# Patient Record
Sex: Female | Born: 1995 | Race: White | Hispanic: No | Smoking: Never smoker
Health system: Southern US, Community
[De-identification: ages and names within clinical notes are randomized; demographics above are authoritative.]

## PROBLEM LIST (undated history)

## (undated) ENCOUNTER — Inpatient Hospital Stay (HOSPITAL_COMMUNITY): Payer: Self-pay

## (undated) DIAGNOSIS — J45909 Unspecified asthma, uncomplicated: Secondary | ICD-10-CM

## (undated) HISTORY — PX: WISDOM TOOTH EXTRACTION: SHX21

## (undated) HISTORY — DX: Unspecified asthma, uncomplicated: J45.909

---
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
## 2016-03-08 ENCOUNTER — Encounter: Payer: Self-pay | Admitting: Family Medicine

## 2016-03-08 ENCOUNTER — Ambulatory Visit (INDEPENDENT_AMBULATORY_CARE_PROVIDER_SITE_OTHER): Payer: Medicaid Other | Admitting: Family Medicine

## 2016-03-08 VITALS — BP 116/82 | HR 94 | Temp 98.5°F | Ht 64.0 in | Wt 187.6 lb

## 2016-03-08 DIAGNOSIS — Z23 Encounter for immunization: Secondary | ICD-10-CM | POA: Diagnosis not present

## 2016-03-08 DIAGNOSIS — N926 Irregular menstruation, unspecified: Secondary | ICD-10-CM | POA: Insufficient documentation

## 2016-03-08 LAB — POCT URINE PREGNANCY: Preg Test, Ur: NEGATIVE

## 2016-03-08 NOTE — Progress Notes (Signed)
   Subjective:   Jody Mooney is a 21 y.o. female with a history of intermittent asthma here for same day appt for pregnancy test  LMP end of October or first week of November 4 home pregnancy tests - all negative Sexually active with 1 female partner Menstrual periods are typically irregular since stopping Depo Tried OCPs in 06/2015 but has been off of these for about 6 months Contraception: no Condoms: no Does not want to become pregnant for >2 yrs Denies abdominal pain, fevers, vaginal discharge, lower extremity edema  Review of Systems:  Per HPI.   Social History: never smoker  Objective:  BP 116/82 (BP Location: Right Arm, Patient Position: Sitting, Cuff Size: Normal)   Pulse 94   Temp 98.5 F (36.9 C) (Oral)   Ht 5\' 4"  (1.626 m)   Wt 187 lb 9.6 oz (85.1 kg)   LMP 12/30/2015 (Approximate)   SpO2 93%   BMI 32.20 kg/m   Gen:  21 y.o. female in NAD HEENT: NCAT, MMM, EOMI, PERRL, anicteric sclerae CV: RRR, no MRG Resp: Non-labored, CTAB, no wheezes noted Abd: Soft, NTND, BS present, no guarding or organomegaly Ext: WWP, no edema MSK: No obvious deformities, gait intact Neuro: Alert and oriented, speech normal      Urine Pregnancy negative   Assessment & Plan:     Jody Mooney is a 21 y.o. female here for   Irregular menses Urine pregnancy test negative Irregular menses likely related to recent changes in contraception management Long discussion with patient about contraceptive options Information packet given about Mirena IUD as patient is more strongly considering this method of contraception Discussed with patient that if menses remain irregular off of hormonal contraception, could consider doing lab work for PCOS Follow-up in 3-4 months if irregular menses continue for further workup or sooner if contraceptive method is desired     Virginia Crews, MD MPH PGY-3,  [facility] 03/08/2016  4:15 PM

## 2016-03-08 NOTE — Assessment & Plan Note (Signed)
Urine pregnancy test negative Irregular menses likely related to recent changes in contraception management Long discussion with patient about contraceptive options Information packet given about Mirena IUD as patient is more strongly considering this method of contraception Discussed with patient that if menses remain irregular off of hormonal contraception, could consider doing lab work for PCOS Follow-up in 3-4 months if irregular menses continue for further workup or sooner if contraceptive method is desired

## 2016-03-08 NOTE — Patient Instructions (Signed)
Nice to meet you today. Consider birth control as we discussed today. If periods remain irregular, I would make a follow-up appointment and the provider that time can consider doing lab work to see if PCOS is a possibility.  Take care, Dr. Jacinto Reap

## 2017-03-08 ENCOUNTER — Ambulatory Visit (INDEPENDENT_AMBULATORY_CARE_PROVIDER_SITE_OTHER): Payer: Medicaid Other | Admitting: Family Medicine

## 2017-03-08 ENCOUNTER — Other Ambulatory Visit: Payer: Self-pay

## 2017-03-08 ENCOUNTER — Encounter: Payer: Self-pay | Admitting: Family Medicine

## 2017-03-08 VITALS — BP 112/70 | HR 76 | Temp 98.5°F | Ht 63.0 in | Wt 177.0 lb

## 2017-03-08 DIAGNOSIS — H6123 Impacted cerumen, bilateral: Secondary | ICD-10-CM

## 2017-03-08 MED ORDER — CARBAMIDE PEROXIDE 6.5 % OT SOLN: 5.0000 [drp] | Freq: Two times a day (BID) | OTIC | 0 refills | Status: DC

## 2017-03-08 NOTE — Patient Instructions (Signed)
It was a pleasure to see you today! Thank you for choosing [facility] for your primary care. Jody Mooney was seen for hearing loss which resolved. We debrided some of the wax, but you may need further treatment with prescription drops. If it gets worse, come back to clinic in 1 month for further debridement. Stop using que tips as this compacts the wax and makes it worse.   Best,  Marny Lowenstein, MD, [place] - PGY1 03/08/2017 2:23 PM   Earwax Buildup, Adult The ears produce a substance called earwax that helps keep bacteria out of the ear and protects the skin in the ear canal. Occasionally, earwax can build up in the ear and cause discomfort or hearing loss. What increases the risk? This condition is more likely to develop in people who:  Are female.  Are elderly.  Naturally produce more earwax.  Clean their ears often with cotton swabs.  Use earplugs often.  Use in-ear headphones often.  Wear hearing aids.  Have narrow ear canals.  Have earwax that is overly thick or sticky.  Have eczema.  Are dehydrated.  Have excess hair in the ear canal.  What are the signs or symptoms? Symptoms of this condition include:  Reduced or muffled hearing.  A feeling of fullness in the ear or feeling that the ear is plugged.  Fluid coming from the ear.  Ear pain.  Ear itch.  Ringing in the ear.  Coughing.  An obvious piece of earwax that can be seen inside the ear canal.  How is this diagnosed? This condition may be diagnosed based on:  Your symptoms.  Your medical history.  An ear exam. During the exam, your health care provider will look into your ear with an instrument called an otoscope.  You may have tests, including a hearing test. How is this treated? This condition may be treated by:  Using ear drops to soften the earwax.  Having the earwax removed by a health care provider. The health care provider may: ? Flush the ear with  water. ? Use an instrument that has a loop on the end (curette). ? Use a suction device.  Surgery to remove the wax buildup. This may be done in severe cases.  Follow these instructions at home:  Take over-the-counter and prescription medicines only as told by your health care provider.  Do not put any objects, including cotton swabs, into your ear. You can clean the opening of your ear canal with a washcloth or facial tissue.  Follow instructions from your health care provider about cleaning your ears. Do not over-clean your ears.  Drink enough fluid to keep your urine clear or pale yellow. This will help to thin the earwax.  Keep all follow-up visits as told by your health care provider. If earwax builds up in your ears often or if you use hearing aids, consider seeing your health care provider for routine, preventive ear cleanings. Ask your health care provider how often you should schedule your cleanings.  If you have hearing aids, clean them according to instructions from the manufacturer and your health care provider. Contact a health care provider if:  You have ear pain.  You develop a fever.  You have blood, pus, or other fluid coming from your ear.  You have hearing loss.  You have ringing in your ears that does not go away.  Your symptoms do not improve with treatment.  You feel like the room is spinning (  vertigo). Summary  Earwax can build up in the ear and cause discomfort or hearing loss.  The most common symptoms of this condition include reduced or muffled hearing and a feeling of fullness in the ear or feeling that the ear is plugged.  This condition may be diagnosed based on your symptoms, your medical history, and an ear exam.  This condition may be treated by using ear drops to soften the earwax or by having the earwax removed by a health care provider.  Do not put any objects, including cotton swabs, into your ear. You can clean the opening of your ear  canal with a washcloth or facial tissue. This information is not intended to replace advice given to you by your health care provider. Make sure you discuss any questions you have with your health care provider. Document Released: 03/25/2004 Document Revised: 04/28/2016 Document Reviewed: 04/28/2016 Elsevier Interactive Patient Education  [employer].

## 2017-03-08 NOTE — Progress Notes (Signed)
    Subjective:  Jody Mooney is a 22 y.o. female who presents to the [facility] today with a chief complaint of right sided hearloss and ear pressure.   HPI: Symptoms have been pressent for 1 week. Resolved 1 hours before visit with "pops". Patient has some pain at night, pain of 5-6/10. Wake from sleep. Never had anything like this before. Patient had aunt with similar presentation with complete hearing loss. Patient has normal hearing now. No pain after "popping" after visit today. Pt had week of nasal congestion and felt like a "cold was coming on." No swimming. Pt has tried to clean it out multiple times with que tips, but did not help. Did not take any OTC for pain or pressure. Pain was worse when laying head down at night. Went to concert night of new years, and symptoms started.   Denies f/c, n/v, ha, vision change, dizziness.   ROS: Per HPI  PMH: Smoking history reviewed. Does not smoke.    Objective:  Physical Exam: BP 112/70   Pulse 76   Temp 98.5 F (36.9 C) (Oral)   Ht 5\' 3"  (1.6 m)   Wt 177 lb (80.3 kg)   LMP 03/01/2017 (Approximate)   SpO2 98%   BMI 31.35 kg/m   Gen: NAD, resting comfortably HEENT: Bilateral cerumen in both ears, right ear tender to insertion of otoscope, currete had some dried blood on it, normal hearing, negative rinne and weber  CV: RRR with no murmurs appreciated Pulm: NWOB, CTAB with no crackles, wheezes, or rhonchi GI: Normal bowel sounds present. Soft, Nontender, Nondistended. MSK: no edema, cyanosis, or clubbing noted Skin: warm, dry Neuro: grossly normal, moves all extremities Psych: Normal affect and thought content  No results found for this or any previous visit (from the past 72 hour(s)).   Assessment/Plan:  No problem-specific Assessment & Plan notes found for this encounter.   Lab Orders  No laboratory test(s) ordered today    No orders of the defined types were placed in this encounter.   Marny Lowenstein, MD, MS FAMILY  MEDICINE RESIDENT - PGY1 03/08/2017 1:48 PM

## 2017-03-08 NOTE — Assessment & Plan Note (Signed)
Patient has bilateral cerumen present. S/p irrigation with continued burden, but able to visualize TM and appears to be intact. Will rx debrox. No  Patient can return in 1 month if sx return.

## 2017-06-28 NOTE — Progress Notes (Signed)
   [facility] Phone: [phone]   Date of Visit: 06/29/2017   HPI:  Trying to Get Pregnant: - patient and her boyfriend are interested in getting pregnant. She is G0P0.  - they have been actively trying for 2 months  - she has regular monthly periods which last for 5 days  - she wonders if her past use of contraceptive methods have affected her ability to conceive  - she was on Depo Provera over a year and half ago. Prior to this was on OCP for a few months  - she does not smoke - she exercises regularly   ROS: See HPI.  [place]:  PMH: Asthma  PHYSICAL EXAM: BP 120/74   Pulse (!) 55   Temp 98.5 F (36.9 C) (Oral)   Ht 5\' 3"  (1.6 m)   Wt 178 lb (80.7 kg)   LMP 06/13/2017   SpO2 99%   BMI 31.53 kg/m  GEN: NAD HEENT: neck supple, EOMI, sclera clear  CV: RRR, no murmurs, rubs, or gallops PULM: CTAB, normal effort PSYCH: Mood and affect euthymic, normal rate and volume of speech NEURO: Awake, alert, no focal deficits grossly, normal speech  ASSESSMENT/PLAN:  1. Pre-conception counseling Patient is having regular monthly periods and has been off of Depo Provera. Has been actively trying for 2 months. Reassured patient to continue trying. Recommended getting ovulation app to assist with this. Recommended daily MVM. If she does not become pregnant while activity trying for 6-12 months (12 months for her age), would recommend further work up.   Smiley Houseman, MD PGY [place]

## 2017-06-29 ENCOUNTER — Ambulatory Visit (INDEPENDENT_AMBULATORY_CARE_PROVIDER_SITE_OTHER): Payer: BLUE CROSS/BLUE SHIELD | Admitting: Internal Medicine

## 2017-06-29 ENCOUNTER — Other Ambulatory Visit: Payer: Self-pay

## 2017-06-29 ENCOUNTER — Encounter: Payer: Self-pay | Admitting: Internal Medicine

## 2017-06-29 VITALS — BP 120/74 | HR 55 | Temp 98.5°F | Ht 63.0 in | Wt 178.0 lb

## 2017-06-29 DIAGNOSIS — Z3169 Encounter for other general counseling and advice on procreation: Secondary | ICD-10-CM | POA: Diagnosis not present

## 2017-06-29 NOTE — Patient Instructions (Signed)
Thank you for coming!  You can try to use an ovulation app to help with getting pregnant I would recommend starting a daily prenatal vitamin as well   Please let us know if you have any questions

## 2017-07-26 NOTE — Progress Notes (Signed)
   [facility] Phone: [phone]   Date of Visit: 07/27/2017   HPI:  Positive home pregnancy test: -Patient reports of having 2+ home pregnancy test this month.  She missed her period for this month.  Her last menstrual period was June 09, 2017.  She is not  completely sure about her LMP date.  She has been having nausea recently.  No cramping or vaginal bleeding or vaginal discharge.  She is been having some breast tenderness as well. -She and her boyfriend are trying to get pregnant  ROS: See HPI.  [place]:  PMH: Intermittent asthma  PHYSICAL EXAM: BP 100/60   Pulse (!) 54   Temp 98.7 F (37.1 C) (Oral)   Ht 5\' 3"  (1.6 m)   Wt 176 lb (79.8 kg)   LMP 06/09/2017   SpO2 98%   BMI 31.18 kg/m   GEN: NAD HEENT: Atraumatic, normocephalic, neck supple, EOMI, sclera clear  CV: RRR, no murmurs, rubs, or gallops PULM: CTAB, normal effort ABD: Soft, nontender, nondistended, NABS, no organomegaly SKIN: No rash or cyanosis; warm and well-perfused EXTR: No lower extremity edema or calf tenderness PSYCH: Mood and affect euthymic, normal rate and volume of speech NEURO: Awake, alert, no focal deficits grossly, normal speech   ASSESSMENT/PLAN:  Positive urine pregnancy test:  Verified with urine pregnancy test in clinic today.  Recommended starting a daily prenatal vitamin which has been prescribed.  Recommended vitamin B6 as needed for nausea.  Counseled on appropriate diet during pregnancy.  Discussed making a initial OB visit in the next week or 2.  OB labs obtained.  Smiley Houseman, MD PGY [place]

## 2017-07-27 ENCOUNTER — Encounter: Payer: Self-pay | Admitting: Internal Medicine

## 2017-07-27 ENCOUNTER — Other Ambulatory Visit: Payer: Self-pay

## 2017-07-27 ENCOUNTER — Ambulatory Visit (INDEPENDENT_AMBULATORY_CARE_PROVIDER_SITE_OTHER): Payer: BLUE CROSS/BLUE SHIELD | Admitting: Internal Medicine

## 2017-07-27 VITALS — BP 100/60 | HR 54 | Temp 98.7°F | Ht 63.0 in | Wt 176.0 lb

## 2017-07-27 DIAGNOSIS — N926 Irregular menstruation, unspecified: Secondary | ICD-10-CM | POA: Diagnosis not present

## 2017-07-27 DIAGNOSIS — Z3201 Encounter for pregnancy test, result positive: Secondary | ICD-10-CM

## 2017-07-27 LAB — POCT URINE PREGNANCY: Preg Test, Ur: POSITIVE — AB

## 2017-07-27 MED ORDER — PRENATAL MULTIVITAMIN CH: 1.0000 | ORAL_TABLET | Freq: Every day | ORAL | 3 refills | Status: DC

## 2017-07-27 MED ORDER — VITAMIN B-6 25 MG PO TABS: 25.0000 mg | ORAL_TABLET | Freq: Three times a day (TID) | ORAL | 0 refills | Status: DC | PRN

## 2017-07-27 NOTE — Patient Instructions (Addendum)
Please make an initial OB visit at the front desk  I sent a prescription for Vitamin B6 for nausea Please take prenatal vitamin daily   Eating Plan for Pregnant Women While you are pregnant, your body will require additional nutrition to help support your growing baby. It is recommended that you consume:  150 additional calories each day during your first trimester.  300 additional calories each day during your second trimester.  300 additional calories each day during your third trimester.  Eating a healthy, well-balanced diet is very important for your health and for your baby's health. You also have a higher need for some vitamins and minerals, such as folic acid, calcium, iron, and vitamin D. What do I need to know about eating during pregnancy?  Do not try to lose weight or go on a diet during pregnancy.  Choose healthy, nutritious foods. Choose  of a sandwich with a glass of milk instead of a candy bar or a high-calorie sugar-sweetened beverage.  Limit your overall intake of foods that have "empty calories." These are foods that have little nutritional value, such as sweets, desserts, candies, sugar-sweetened beverages, and fried foods.  Eat a variety of foods, especially fruits and vegetables.  Take a prenatal vitamin to help meet the additional needs during pregnancy, specifically for folic acid, iron, calcium, and vitamin D.  Remember to stay active. Ask your health care provider for exercise recommendations that are specific to you.  Practice good food safety and cleanliness, such as washing your hands before you eat and after you prepare raw meat. This helps to prevent foodborne illnesses, such as listeriosis, that can be very dangerous for your baby. Ask your health care provider for more information about listeriosis. What does 150 extra calories look like? Healthy options for an additional 150 calories each day could be any of the following:  Plain low-fat yogurt (6-8  oz) with  cup of berries.  1 apple with 2 teaspoons of peanut butter.  Cut-up vegetables with  cup of hummus.  Low-fat chocolate milk (8 oz or 1 cup).  1 string cheese with 1 medium orange.   of a peanut butter and jelly sandwich on whole-wheat bread (1 tsp of peanut butter).  For 300 calories, you could eat two of those healthy options each day. What is a healthy amount of weight to gain? The recommended amount of weight for you to gain is based on your pre-pregnancy BMI. If your pre-pregnancy BMI was:  Less than 18 (underweight), you should gain 28-40 lb.  18-24.9 (normal), you should gain 25-35 lb.  25-29.9 (overweight), you should gain 15-25 lb.  Greater than 30 (obese), you should gain 11-20 lb.  What if I am having twins or multiples? Generally, pregnant women who will be having twins or multiples may need to increase their daily calories by 300-600 calories each day. The recommended range for total weight gain is 25-54 lb, depending on your pre-pregnancy BMI. Talk with your health care provider for specific guidance about additional nutritional needs, weight gain, and exercise during your pregnancy. What foods can I eat? Grains Any grains. Try to choose whole grains, such as whole-wheat bread, oatmeal, or brown rice. Vegetables Any vegetables. Try to eat a variety of colors and types of vegetables to get a full range of vitamins and minerals. Remember to wash your vegetables well before eating. Fruits Any fruits. Try to eat a variety of colors and types of fruit to get a full range of vitamins  and minerals. Remember to wash your fruits well before eating. Meats and Other Protein Sources Lean meats, including chicken, [place], fish, and lean cuts of beef, veal, or pork. Make sure that all meats are cooked to "well done." Tofu. Tempeh. Beans. Eggs. Peanut butter and other nut butters. Seafood, such as shrimp, crab, and lobster. If you choose fish, select types that are higher  in omega-3 fatty acids, including salmon, herring, mussels, trout, sardines, and pollock. Make sure that all meats are cooked to food-safe temperatures. Dairy Pasteurized milk and milk alternatives. Pasteurized yogurt and pasteurized cheese. Cottage cheese. Sour cream. Beverages Water. Juices that contain 100% fruit juice or vegetable juice. Caffeine-free teas and decaffeinated coffee. Drinks that contain caffeine are okay to drink, but it is better to avoid caffeine. Keep your total caffeine intake to less than 200 mg each day (12 oz of coffee, tea, or soda) or as directed by your health care provider. Condiments Any pasteurized condiments. Sweets and Desserts Any sweets and desserts. Fats and Oils Any fats and oils. The items listed above may not be a complete list of recommended foods or beverages. Contact your dietitian for more options. What foods are not recommended? Vegetables Unpasteurized (raw) vegetable juices. Fruits Unpasteurized (raw) fruit juices. Meats and Other Protein Sources Cured meats that have nitrates, such as bacon, salami, and hotdogs. Luncheon meats, bologna, or other deli meats (unless they are reheated until they are steaming hot). Refrigerated pate, meat spreads from a meat counter, smoked seafood that is found in the refrigerated section of a store. Raw fish, such as sushi or sashimi. High mercury content fish, such as tilefish, shark, swordfish, and king mackerel. Raw meats, such as tuna or beef tartare. Undercooked meats and poultry. Make sure that all meats are cooked to food-safe temperatures. Dairy Unpasteurized (raw) milk and any foods that have raw milk in them. Soft cheeses, such as feta, queso blanco, queso fresco, Brie, Camembert cheeses, blue-veined cheeses, and Panela cheese (unless it is made with pasteurized milk, which must be stated on the label). Beverages Alcohol. Sugar-sweetened beverages, such as sodas, teas, or energy  drinks. Condiments Homemade fermented foods and drinks, such as pickles, sauerkraut, or kombucha drinks. (Store-bought pasteurized versions of these are okay.) Other Salads that are made in the store, such as ham salad, chicken salad, egg salad, tuna salad, and seafood salad. The items listed above may not be a complete list of foods and beverages to avoid. Contact your dietitian for more information. This information is not intended to replace advice given to you by your health care provider. Make sure you discuss any questions you have with your health care provider. Document Released: 11/30/2013 Document Revised: 07/24/2015 Document Reviewed: 07/31/2013 Elsevier Interactive Patient Education  [employer].

## 2017-07-29 LAB — OBSTETRIC PANEL, INCLUDING HIV
Antibody Screen: NEGATIVE
Basophils Absolute: 0 10*3/uL (ref 0.0–0.2)
Basos: 0 %
EOS (ABSOLUTE): 0.1 10*3/uL (ref 0.0–0.4)
Eos: 1 %
HEMATOCRIT: 39 % (ref 34.0–46.6)
HIV Screen 4th Generation wRfx: NONREACTIVE
Hemoglobin: 13.4 g/dL (ref 11.1–15.9)
Hepatitis B Surface Ag: NEGATIVE
IMMATURE GRANS (ABS): 0 10*3/uL (ref 0.0–0.1)
IMMATURE GRANULOCYTES: 0 %
LYMPHS: 16 %
Lymphocytes Absolute: 1 10*3/uL (ref 0.7–3.1)
MCH: 29.8 pg (ref 26.6–33.0)
MCHC: 34.4 g/dL (ref 31.5–35.7)
MCV: 87 fL (ref 79–97)
MONOCYTES: 10 %
Monocytes Absolute: 0.6 10*3/uL (ref 0.1–0.9)
Neutrophils Absolute: 4.8 10*3/uL (ref 1.4–7.0)
Neutrophils: 73 %
PLATELETS: 230 10*3/uL (ref 150–450)
RBC: 4.5 x10E6/uL (ref 3.77–5.28)
RDW: 13.4 % (ref 12.3–15.4)
RPR Ser Ql: NONREACTIVE
RUBELLA: 6.86 {index} (ref >0.99)
Rh Factor: POSITIVE
WBC: 6.5 10*3/uL (ref 3.4–10.8)

## 2017-08-08 ENCOUNTER — Emergency Department: Payer: BLUE CROSS/BLUE SHIELD

## 2017-08-08 ENCOUNTER — Encounter: Payer: Self-pay | Admitting: Emergency Medicine

## 2017-08-08 DIAGNOSIS — O209 Hemorrhage in early pregnancy, unspecified: Secondary | ICD-10-CM | POA: Diagnosis not present

## 2017-08-08 DIAGNOSIS — Z3A08 8 weeks gestation of pregnancy: Secondary | ICD-10-CM | POA: Insufficient documentation

## 2017-08-08 DIAGNOSIS — O9952 Diseases of the respiratory system complicating childbirth: Secondary | ICD-10-CM | POA: Diagnosis not present

## 2017-08-08 DIAGNOSIS — O200 Threatened abortion: Secondary | ICD-10-CM | POA: Insufficient documentation

## 2017-08-08 DIAGNOSIS — N83201 Unspecified ovarian cyst, right side: Secondary | ICD-10-CM | POA: Diagnosis not present

## 2017-08-08 DIAGNOSIS — J45909 Unspecified asthma, uncomplicated: Secondary | ICD-10-CM | POA: Insufficient documentation

## 2017-08-08 DIAGNOSIS — B373 Candidiasis of vulva and vagina: Secondary | ICD-10-CM | POA: Diagnosis not present

## 2017-08-08 DIAGNOSIS — O23591 Infection of other part of genital tract in pregnancy, first trimester: Secondary | ICD-10-CM | POA: Diagnosis not present

## 2017-08-08 DIAGNOSIS — O3481 Maternal care for other abnormalities of pelvic organs, first trimester: Secondary | ICD-10-CM | POA: Insufficient documentation

## 2017-08-08 DIAGNOSIS — Z3A01 Less than 8 weeks gestation of pregnancy: Secondary | ICD-10-CM | POA: Diagnosis not present

## 2017-08-08 DIAGNOSIS — B9689 Other specified bacterial agents as the cause of diseases classified elsewhere: Secondary | ICD-10-CM | POA: Diagnosis not present

## 2017-08-08 DIAGNOSIS — O26851 Spotting complicating pregnancy, first trimester: Secondary | ICD-10-CM | POA: Diagnosis not present

## 2017-08-08 LAB — POCT PREGNANCY, URINE: Preg Test, Ur: POSITIVE — AB

## 2017-08-08 LAB — HCG, QUANTITATIVE, PREGNANCY: hCG, Beta Chain, Quant, S: 249696 m[IU]/mL — ABNORMAL HIGH (ref <5)

## 2017-08-08 LAB — BASIC METABOLIC PANEL
Anion gap: 7 (ref 5–15)
BUN: 13 mg/dL (ref 6–20)
CO2: 25 mmol/L (ref 22–32)
CREATININE: 0.79 mg/dL (ref 0.44–1.00)
Calcium: 8.9 mg/dL (ref 8.9–10.3)
Chloride: 103 mmol/L (ref 101–111)
GFR calc Af Amer: >60 mL/min (ref >60)
GFR calc non Af Amer: >60 mL/min (ref >60)
Glucose, Bld: 73 mg/dL (ref 65–99)
Potassium: 3.8 mmol/L (ref 3.5–5.1)
SODIUM: 135 mmol/L (ref 135–145)

## 2017-08-08 LAB — CBC WITH DIFFERENTIAL/PLATELET
Basophils Absolute: 0 10*3/uL (ref 0–0.1)
Basophils Relative: 0 %
EOS ABS: 0.1 10*3/uL (ref 0–0.7)
EOS PCT: 1 %
HCT: 38.2 % (ref 35.0–47.0)
Hemoglobin: 13.3 g/dL (ref 12.0–16.0)
LYMPHS ABS: 1.7 10*3/uL (ref 1.0–3.6)
Lymphocytes Relative: 17 %
MCH: 31.1 pg (ref 26.0–34.0)
MCHC: 34.9 g/dL (ref 32.0–36.0)
MCV: 89 fL (ref 80.0–100.0)
MONOS PCT: 7 %
Monocytes Absolute: 0.7 10*3/uL (ref 0.2–0.9)
Neutro Abs: 7.4 10*3/uL — ABNORMAL HIGH (ref 1.4–6.5)
Neutrophils Relative %: 75 %
Platelets: 249 10*3/uL (ref 150–440)
RBC: 4.29 MIL/uL (ref 3.80–5.20)
RDW: 12.5 % (ref 11.5–14.5)
WBC: 9.9 10*3/uL (ref 3.6–11.0)

## 2017-08-08 LAB — ABO/RH: ABO/RH(D): O POS

## 2017-08-08 NOTE — ED Triage Notes (Signed)
Pt presents to ED with lower abd cramping since last night and this evening pt noticed a small amount of pink blood mixed in with her discharge while wiping after using the restroom.  Currently [redacted] weeks pregnant.  No hx of the same.

## 2017-08-08 NOTE — ED Notes (Signed)
Pt to u/s via w/c accomp by u/s tech 

## 2017-08-08 NOTE — Progress Notes (Signed)
Subjective:   Patient ID: Jody Mooney    DOB: 1995-07-14, 22 y.o. female   MRN: [id number]  Jody Mooney is a 22 y.o. female with a history of PVC, intermittent asthma here for initial prenatal visit.  No obstetric history on file. Pregnancy is "somewhat planned," her and boyfriend are excited. She reports nausea and positive home pregnancy test. Has been using B6 for nausea but it hasn't been helping. LMP 06/09/2017, although not entirely certain - last period was normal for her, lasted 5 days with normal flow. She is taking PNV. See flow sheet for details.  Evaluated in the ED last night for vaginal bleeding, lower abdominal pain and threatened miscarriage. Quant BHcg [id number]. Wet prep positive for BV, received metronidazole. Transvaginal US revealed viable IUP with large 10.5 x 6.6 x 5.1 cm R adnexal mass with 2.3 x 2.6 x 2.7 cm area of echogenicity in L ovary both favoring dermoid. Denies vaginal bleeding or pain since.  PMH, POBH, FH, meds, allergies and Social Hx reviewed.  Objective:   BP 100/60   Pulse 68   Temp 98.4 F (36.9 C)   Wt 175 lb (79.4 kg)   LMP 06/09/2017   BMI 31.00 kg/m   Estimated body mass index is 31 kg/m as calculated from the following:   Height as of 07/27/17: 5\' 3"  (1.6 m).   Weight as of this encounter: 175 lb (79.4 kg). Vitals and nursing note reviewed.  Prenatal Exam: Gen: Overweight female.  No distress.  Vitals noted. HEENT: Normocephalic, atraumatic.  Neck supple without cervical lymphadenopathy, thyromegaly or thyroid nodules.  Fair dentition, braces. CV: RRR no murmur, gallops or rubs Lungs: CTAB.  Normal respiratory effort without wheezes or rales. Abd: soft, NTND. +BS.  Uterus not appreciated above pelvis. GU: Normal external female genitalia without lesions.  Normal vaginal, well rugated without lesions. No vaginal discharge.  Bimanual exam: Palpable R adnexal mass. Not TTP. No CMT.  Uterus size not appreciated above pelvic  bones. Ext: No clubbing, cyanosis or edema. Psych: Normal grooming and dress.  Not depressed or anxious appearing.  Normal thought content and process without flight of ideas or looseness of associations.  Assessment & Plan:   No problem-specific Assessment & Plan notes found for this encounter.  Orders Placed This Encounter  Procedures  . POCT 1 Hr Prenatal Glucose   Meds ordered this encounter  Medications  . doxylamine, Sleep, (UNISOM) 25 MG tablet    Sig: Take 1 tablet (25 mg total) by mouth at bedtime as needed.    Dispense:  30 tablet    Refill:  0   1) 22 y.o. G1P0 at [facility] via early ultrasound here for initial OB, doing well.  Current pregnancy issues include nausea refractory to B6. Will prescribe Unisom. Dating is reliable, based on early ultrasound. Prenatal labs reviewed, notable for BV on ED wet prep, otherwise wnl. Genetic screening offered: yes, patient to receive at her next appointment. Early glucola is indicated. Early GTT completed today, will call with results. PHQ-9 and Pregnancy Medical Home forms completed and reviewed.  2) Reviewed [place] results of adnexal mass with [facility] attending who recommended follow up ultrasound later in pregnancy. No need for pelvic MRI or surgical intervention at this time. Return precautions reviewed with patient.  Bleeding and pain precautions reviewed. Importance of prenatal vitamins reviewed.  Follow up in 4 weeks.  Rory Percy, DO PGY-1, [facility] 08/09/2017 12:15 PM

## 2017-08-09 ENCOUNTER — Other Ambulatory Visit: Payer: Self-pay

## 2017-08-09 ENCOUNTER — Ambulatory Visit (INDEPENDENT_AMBULATORY_CARE_PROVIDER_SITE_OTHER): Payer: BLUE CROSS/BLUE SHIELD | Admitting: Family Medicine

## 2017-08-09 ENCOUNTER — Other Ambulatory Visit (HOSPITAL_COMMUNITY)
Admission: RE | Admit: 2017-08-09 | Discharge: 2017-08-09 | Disposition: A | Discharge status: Home / Self Care | Payer: BLUE CROSS/BLUE SHIELD | Source: Ambulatory Visit | Attending: Family Medicine | Admitting: Family Medicine

## 2017-08-09 ENCOUNTER — Encounter: Payer: Self-pay | Admitting: Family Medicine

## 2017-08-09 ENCOUNTER — Emergency Department
Admission: EM | Admit: 2017-08-09 | Discharge: 2017-08-09 | Disposition: A | Discharge status: Home / Self Care | Payer: BLUE CROSS/BLUE SHIELD | Attending: Emergency Medicine | Admitting: Emergency Medicine

## 2017-08-09 VITALS — BP 100/60 | HR 68 | Temp 98.4°F | Wt 175.0 lb

## 2017-08-09 DIAGNOSIS — Z3480 Encounter for supervision of other normal pregnancy, unspecified trimester: Secondary | ICD-10-CM | POA: Diagnosis not present

## 2017-08-09 DIAGNOSIS — B9689 Other specified bacterial agents as the cause of diseases classified elsewhere: Secondary | ICD-10-CM

## 2017-08-09 DIAGNOSIS — Z3481 Encounter for supervision of other normal pregnancy, first trimester: Secondary | ICD-10-CM | POA: Diagnosis not present

## 2017-08-09 DIAGNOSIS — N760 Acute vaginitis: Secondary | ICD-10-CM

## 2017-08-09 DIAGNOSIS — B373 Candidiasis of vulva and vagina: Secondary | ICD-10-CM

## 2017-08-09 DIAGNOSIS — Z3401 Encounter for supervision of normal first pregnancy, first trimester: Secondary | ICD-10-CM | POA: Diagnosis not present

## 2017-08-09 DIAGNOSIS — O26851 Spotting complicating pregnancy, first trimester: Secondary | ICD-10-CM | POA: Diagnosis not present

## 2017-08-09 DIAGNOSIS — O200 Threatened abortion: Secondary | ICD-10-CM

## 2017-08-09 DIAGNOSIS — Z3A01 Less than 8 weeks gestation of pregnancy: Secondary | ICD-10-CM | POA: Diagnosis not present

## 2017-08-09 DIAGNOSIS — B3731 Acute candidiasis of vulva and vagina: Secondary | ICD-10-CM

## 2017-08-09 DIAGNOSIS — Z3A09 9 weeks gestation of pregnancy: Secondary | ICD-10-CM | POA: Diagnosis not present

## 2017-08-09 DIAGNOSIS — N83201 Unspecified ovarian cyst, right side: Secondary | ICD-10-CM

## 2017-08-09 LAB — URINALYSIS, COMPLETE (UACMP) WITH MICROSCOPIC
Bacteria, UA: NONE SEEN
Bilirubin Urine: NEGATIVE
Glucose, UA: NEGATIVE mg/dL
Hgb urine dipstick: NEGATIVE
Ketones, ur: NEGATIVE mg/dL
Leukocytes, UA: NEGATIVE
NITRITE: NEGATIVE
PH: 6 (ref 5.0–8.0)
Protein, ur: NEGATIVE mg/dL
SPECIFIC GRAVITY, URINE: 1.005 (ref 1.005–1.030)

## 2017-08-09 LAB — WET PREP, GENITAL
SPERM: NONE SEEN
TRICH WET PREP: NONE SEEN

## 2017-08-09 LAB — POCT 1 HR PRENATAL GLUCOSE: Glucose 1 Hr Prenatal, POC: 111 mg/dL

## 2017-08-09 LAB — CHLAMYDIA/NGC RT PCR (ARMC ONLY)
Chlamydia Tr: NOT DETECTED
N gonorrhoeae: NOT DETECTED

## 2017-08-09 MED ORDER — MICONAZOLE NITRATE APPLICATOR 100 & 2 MG-% (9GM) VA KIT: 1.0000 "application " | PACK | Freq: Every day | VAGINAL | 0 refills | Status: AC

## 2017-08-09 MED ORDER — METRONIDAZOLE 500 MG PO TABS: 500.0000 mg | ORAL_TABLET | Freq: Two times a day (BID) | ORAL | 0 refills | Status: AC

## 2017-08-09 MED ORDER — DOXYLAMINE SUCCINATE (SLEEP) 25 MG PO TABS: 25.0000 mg | ORAL_TABLET | Freq: Every evening | ORAL | 0 refills | Status: DC | PRN

## 2017-08-09 MED ORDER — METRONIDAZOLE 500 MG PO TABS
500.0000 mg | ORAL_TABLET | Freq: Once | ORAL | Status: AC
  Administered 2017-08-09: 500 mg via ORAL
  Filled 2017-08-09: qty 1

## 2017-08-09 NOTE — ED Provider Notes (Signed)
[facility] Emergency Department Provider Note   ____________________________________________   First MD Initiated Contact with Patient 08/09/17 0016     (approximate)  I have reviewed the triage vital signs and the nursing notes.   HISTORY  Chief Complaint Vaginal Bleeding and Abdominal Cramping    HPI Jody Mooney is a 22 y.o. female who comes into the hospital today stating that she is here to make sure she is not having a miscarriage.  The patient is [redacted] weeks pregnant and reports that today she had some cramping and some spotting.  She is a G1, P0.  She goes to her first OB/GYN appointment tomorrow.  The patient states that her family told her to come in to get checked out.  The patient states that her pain is gone now.  She denies any pain with urination.  She is here today for evaluation.   Past Medical History:  Diagnosis Date  . Asthma     Patient Active Problem List   Diagnosis Date Noted  . Cerumen debris on tympanic membrane of both ears 03/08/2017  . PVC (premature ventricular contraction) 02/14/2014  . Bilateral chronic knee pain 05/31/2012  . Hypopigmentation 05/31/2012  . Intermittent asthma 11/03/2011    Past Surgical History:  Procedure Laterality Date  . WISDOM TOOTH EXTRACTION      Prior to Admission medications   Medication Sig Start Date End Date Taking? Authorizing Provider  albuterol (PROVENTIL HFA;VENTOLIN HFA) 108 (90 Base) MCG/ACT inhaler Inhale 2 puffs into the lungs every 6 (six) hours as needed. 07/18/15   Katheren Shams, DO  carbamide peroxide (DEBROX) 6.5 % OTIC solution Place 5 drops into both ears 2 (two) times daily. 03/08/17   Bonnita Hollow, MD  metroNIDAZOLE (FLAGYL) 500 MG tablet Take 1 tablet (500 mg total) by mouth 2 (two) times daily for 7 days. 08/09/17 08/16/17  Loney Hering, MD  Miconazole Nitrate Applicator (MONISTAT 7 COMBO PACK APP) 100 & 2 MG-% (9GM) KIT Place 1 application vaginally at bedtime  for 7 days. 08/09/17 08/16/17  Loney Hering, MD  Prenatal Vit-Fe Fumarate-FA (PRENATAL MULTIVITAMIN) TABS tablet Take 1 tablet by mouth daily at 12 noon. 07/27/17   Smiley Houseman, MD  vitamin B-6 (PYRIDOXINE) 25 MG tablet Take 1 tablet (25 mg total) by mouth 3 (three) times daily as needed (nausea/vomiting of pregnancy). 07/27/17   Smiley Houseman, MD    Allergies Penicillins  Family History  Problem Relation Age of Onset  . Hyperlipidemia Mother   . Anxiety disorder Unknown        Grandmother and mother  . Depression Unknown        Grandmother and mother  . Hyperlipidemia Unknown   . Diabetes type II Unknown        Grand parents  . Other Unknown        VSD repaired in grandmother age 59    Social History Social History   Tobacco Use  . Smoking status: Never Smoker  . Smokeless tobacco: Never Used  Substance Use Topics  . Alcohol use: No  . Drug use: Not on file    Review of Systems  Constitutional: No fever/chills Eyes: No visual changes. ENT: No sore throat. Cardiovascular: Denies chest pain. Respiratory: Denies shortness of breath. Gastrointestinal: abdominal pain.  No nausea, no vomiting.  No diarrhea.  No constipation. Genitourinary: vaginal bleeding Musculoskeletal: Negative for back pain. Skin: Negative for rash. Neurological: Negative for headaches, focal weakness or  numbness.   ____________________________________________   PHYSICAL EXAM:  VITAL SIGNS: ED Triage Vitals  Enc Vitals Group     BP 121/74     Pulse 87     Resp 18     Temp      Temp src      SpO2 99%     Weight      Height      Head Circumference      Peak Flow      Pain Score      Pain Loc      Pain Edu?      Excl. in [place]?     Constitutional: Alert and oriented. Well appearing and in mild distress. Eyes: Conjunctivae are normal. PERRL. EOMI. Ears: Cerumen impaction bilaterally Head: Atraumatic. Nose: No congestion/rhinnorhea. Mouth/Throat: Mucous membranes  are moist.  Oropharynx non-erythematous. Cardiovascular: Normal rate, regular rhythm. Grossly normal heart sounds.  Good peripheral circulation. Respiratory: Normal respiratory effort.  No retractions. Lungs CTAB. Gastrointestinal: Soft and nontender. No distention. Positive bowel sounds Genitourinary: Normal external genitalia with some white appearing vaginal discharge, no cervical motion tenderness, no uterine or adnexal tenderness to palpation. Musculoskeletal: No lower extremity tenderness nor edema.   Neurologic:  Normal speech and language.  Skin:  Skin is warm, dry and intact.  Psychiatric: Mood and affect are normal.   ____________________________________________   LABS (all labs ordered are listed, but only abnormal results are displayed)  Labs Reviewed  WET PREP, GENITAL - Abnormal; Notable for the following components:      Result Value   Yeast Wet Prep HPF POC PRESENT (*)    Clue Cells Wet Prep HPF POC PRESENT (*)    WBC, Wet Prep HPF POC RARE (*)    All other components within normal limits  HCG, QUANTITATIVE, PREGNANCY - Abnormal; Notable for the following components:   hCG, Beta Chain, Quant, S 249,696 (*)    All other components within normal limits  CBC WITH DIFFERENTIAL/PLATELET - Abnormal; Notable for the following components:   Neutro Abs 7.4 (*)    All other components within normal limits  URINALYSIS, COMPLETE (UACMP) WITH MICROSCOPIC - Abnormal; Notable for the following components:   Color, Urine STRAW (*)    APPearance CLEAR (*)    All other components within normal limits  POCT PREGNANCY, URINE - Abnormal; Notable for the following components:   Preg Test, Ur POSITIVE (*)    All other components within normal limits  CHLAMYDIA/NGC RT PCR ([facility] ONLY)  BASIC METABOLIC PANEL  POC URINE PREG, ED  ABO/RH   ____________________________________________  EKG  none ____________________________________________  RADIOLOGY  ED MD  interpretation: Ultrasound OB: Single viable intrauterine pregnancy as above without complication estimated gestational age [redacted] weeks and 2 days, large 10.5 cm complex right adnexal mass most suggestive of an ovarian dermoid, additional 2.7 cm echogenic lesion within the left ovary suspicious for smaller contralateral dermoid.  Official radiology report(s): US Ob Comp Less 14 Wks  Result Date: 08/09/2017 CLINICAL DATA:  Initial evaluation for vaginal spotting, lower abdominal cramping. Early pregnancy. EXAM: OBSTETRIC <14 WK [place] AND TRANSVAGINAL OB US TECHNIQUE: Both transabdominal and transvaginal ultrasound examinations were performed for complete evaluation of the gestation as well as the maternal uterus, adnexal regions, and pelvic cul-de-sac. Transvaginal technique was performed to assess early pregnancy. COMPARISON:  None. FINDINGS: Intrauterine gestational sac: Single Yolk sac:  Present Embryo:  Present Cardiac Activity: Present Heart Rate: 173 bpm CRL: 25.5 mm   9 w  2 d                  [place] EDC: 03/11/2018 Subchorionic hemorrhage:  None visualized. Maternal uterus/adnexae: The native right ovary is not well delineated. There is a large complex somewhat lobulated mixed echogenicity mass measuring approximately 10.5 x 6.6 x 5.1 cm within the right adnexa. Multiple scattered areas of hyperechoic echogenicity. 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 Additionally, a 2.3 x 2.6 x 2.7 cm area of increased echogenicity seen within the left ovary as well, which may reflect a smaller contralateral dermoid. No free fluid within the pelvis. IMPRESSION: 1. Single viable intrauterine pregnancy as above without complication, estimated gestational age [redacted] weeks and 2 days by crown-rump length. 2. Large 10.5 cm complex right adnexal mass, most suggestive of an ovarian dermoid. Further evaluation with dedicated pelvic MRI suggested for further evaluation. 3. Additional 2.7 cm echogenic lesion within the left ovary, suspicious for a  smaller contralateral dermoid. Finding could also be assessed via MRI. Electronically Signed   By: Jeannine Boga M.D.   On: 08/09/2017 01:46   US Ob Transvaginal  Result Date: 08/09/2017 CLINICAL DATA:  Initial evaluation for vaginal spotting, lower abdominal cramping. Early pregnancy. EXAM: OBSTETRIC <14 WK [place] AND TRANSVAGINAL OB US TECHNIQUE: Both transabdominal and transvaginal ultrasound examinations were performed for complete evaluation of the gestation as well as the maternal uterus, adnexal regions, and pelvic cul-de-sac. Transvaginal technique was performed to assess early pregnancy. COMPARISON:  None. FINDINGS: Intrauterine gestational sac: Single Yolk sac:  Present Embryo:  Present Cardiac Activity: Present Heart Rate: 173 bpm CRL: 25.5 mm   9 w   2 d                  [place] EDC: 03/11/2018 Subchorionic hemorrhage:  None visualized. Maternal uterus/adnexae: The native right ovary is not well delineated. There is a large complex somewhat lobulated mixed echogenicity mass measuring approximately 10.5 x 6.6 x 5.1 cm within the right adnexa. Multiple scattered areas of hyperechoic echogenicity. 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 Additionally, a 2.3 x 2.6 x 2.7 cm area of increased echogenicity seen within the left ovary as well, which may reflect a smaller contralateral dermoid. No free fluid within the pelvis. IMPRESSION: 1. Single viable intrauterine pregnancy as above without complication, estimated gestational age [redacted] weeks and 2 days by crown-rump length. 2. Large 10.5 cm complex right adnexal mass, most suggestive of an ovarian dermoid. Further evaluation with dedicated pelvic MRI suggested for further evaluation. 3. Additional 2.7 cm echogenic lesion within the left ovary, suspicious for a smaller contralateral dermoid. Finding could also be assessed via MRI. Electronically Signed   By: Jeannine Boga M.D.   On: 08/09/2017 01:46     ____________________________________________   PROCEDURES  Procedure(s) performed: None  Procedures  Critical Care performed: No  ____________________________________________   INITIAL IMPRESSION / ASSESSMENT AND PLAN / ED COURSE  As part of my medical decision making, I reviewed the following data within the electronic MEDICAL RECORD NUMBER Notes from prior ED visits and [place] Controlled Substance Database   This is a 22 year old female who comes into the hospital today with some vaginal bleeding and lower abdominal pain.  The patient is [redacted] weeks pregnant and is concerned for miscarriage.  My differential diagnosis includes miscarriage, ectopic pregnancy, subchorionic hematoma.  The patient had some blood work drawn which was unremarkable.  Her quantitative beta hCG is 249,696.  Her urinalysis is unremarkable.  We did send the patient for an  ultrasound to evaluate the fetus.  We are awaiting the results.  The patient's wet prep returned and was positive for clue cells as well as yeast.  I did give the patient a dose of metronidazole.  The patient's ultrasound showed a viable 9-week 2-day fetus but also a large 10.5 cm right adnexal mass concerning for dermoid.  The patient is not having any pain at this time but I did inform her that she does need to have that evaluated by OB.  The patient will be discharged home and encouraged to follow-up with her OB/GYN which she has scheduled tomorrow.  She should return with any worse symptoms or any other concerns.      ____________________________________________   FINAL CLINICAL IMPRESSION(S) / ED DIAGNOSES  Final diagnoses:  Threatened miscarriage  BV (bacterial vaginosis)  Vaginal candidiasis  Cyst of right ovary     ED Discharge Orders        Ordered    metroNIDAZOLE (FLAGYL) 500 MG tablet  2 times daily     08/09/17 0158    Miconazole Nitrate Applicator (MONISTAT 7 COMBO PACK APP) 100 & 2 MG-% (9GM) KIT  Daily at bedtime      08/09/17 0158       Note:  This document was prepared using Dragon voice recognition software and may include unintentional dictation errors.    Loney Hering, MD 08/09/17 [phone]

## 2017-08-09 NOTE — Discharge Instructions (Signed)
Please follow-up with your OB/GYN as you have scheduled tomorrow, please take the medication for your yeast infection and bacterial vaginosis, please return with any worsening pain or any other concerns

## 2017-08-09 NOTE — Patient Instructions (Addendum)
It was great to see you!  Our plans for today: - If you are interested in enrolling with [facility], call [phone]. - For childbirth classes through [facility], call [phone] or [phone] - We will further characterize your ovarian mass with a follow up ultrasound later in your pregnancy. - If you develop pelvic pain or vaginal bleeding, you should be seen immediately. - Return in 4 weeks for prenatal appointment.  Take care and seek immediate care sooner if you develop any concerns.   Dr. [facility]   First Trimester of Pregnancy The first trimester of pregnancy is from week 1 until the end of week 13 (months 1 through 3). During this time, your baby will begin to develop inside you. At 6-8 weeks, the eyes and face are formed, and the heartbeat can be seen on ultrasound. At the end of 12 weeks, all the baby's organs are formed. Prenatal care is all the medical care you receive before the birth of your baby. Make sure you get good prenatal care and follow all of your doctor's instructions. Follow these instructions at home: Medicines  Take over-the-counter and prescription medicines only as told by your doctor. Some medicines are safe and some medicines are not safe during pregnancy.  Take a prenatal vitamin that contains at least 600 micrograms (mcg) of folic acid.  If you have trouble pooping (constipation), take medicine that will make your stool soft (stool softener) if your doctor approves. Eating and drinking  Eat regular, healthy meals.  Your doctor will tell you the amount of weight gain that is right for you.  Avoid raw meat and uncooked cheese.  If you feel sick to your stomach (nauseous) or throw up (vomit): ? Eat 4 or 5 small meals a day instead of 3 large meals. ? Try eating a few soda crackers. ? Drink liquids between meals instead of during meals.  To prevent constipation: ? Eat foods that are high in fiber, like fresh fruits and  vegetables, whole grains, and beans. ? Drink enough fluids to keep your pee (urine) clear or pale yellow. Activity  Exercise only as told by your doctor. Stop exercising if you have cramps or pain in your lower belly (abdomen) or low back.  Do not exercise if it is too hot, too humid, or if you are in a place of great height (high altitude).  Try to avoid standing for long periods of time. Move your legs often if you must stand in one place for a long time.  Avoid heavy lifting.  Wear low-heeled shoes. Sit and stand up straight.  You can have sex unless your doctor tells you not to. Relieving pain and discomfort  Wear a good support bra if your breasts are sore.  Take warm water baths (sitz baths) to soothe pain or discomfort caused by hemorrhoids. Use hemorrhoid cream if your doctor says it is okay.  Rest with your legs raised if you have leg cramps or low back pain.  If you have puffy, bulging veins (varicose veins) in your legs: ? Wear support hose or compression stockings as told by your doctor. ? Raise (elevate) your feet for 15 minutes, 3-4 times a day. ? Limit salt in your food. Prenatal care  Schedule your prenatal visits by the twelfth week of pregnancy.  Write down your questions. Take them to your prenatal visits.  Keep all your prenatal visits as told by your doctor. This is important. Safety  Wear your seat belt  at all times when driving.  Make a list of emergency phone numbers. The list should include numbers for family, friends, the hospital, and police and fire departments. General instructions  Ask your doctor for a referral to a local prenatal class. Begin classes no later than at the start of month 6 of your pregnancy.  Ask for help if you need counseling or if you need help with nutrition. Your doctor can give you advice or tell you where to go for help.  Do not use hot tubs, steam rooms, or saunas.  Do not douche or use tampons or scented sanitary  pads.  Do not cross your legs for long periods of time.  Avoid all herbs and alcohol. Avoid drugs that are not approved by your doctor.  Do not use any tobacco products, including cigarettes, chewing tobacco, and electronic cigarettes. If you need help quitting, ask your doctor. You may get counseling or other support to help you quit.  Avoid cat litter boxes and soil used by cats. These carry germs that can cause birth defects in the baby and can cause a loss of your baby (miscarriage) or stillbirth.  Visit your dentist. At home, brush your teeth with a soft toothbrush. Be gentle when you floss. Contact a doctor if:  You are dizzy.  You have mild cramps or pressure in your lower belly.  You have a nagging pain in your belly area.  You continue to feel sick to your stomach, you throw up, or you have watery poop (diarrhea).  You have a bad smelling fluid coming from your vagina.  You have pain when you pee (urinate).  You have increased puffiness (swelling) in your face, hands, legs, or ankles. Get help right away if:  You have a fever.  You are leaking fluid from your vagina.  You have spotting or bleeding from your vagina.  You have very bad belly cramping or pain.  You gain or lose weight rapidly.  You throw up blood. It may look like coffee grounds.  You are around people who have [place] measles, fifth disease, or chickenpox.  You have a very bad headache.  You have shortness of breath.  You have any kind of trauma, such as from a fall or a car accident. Summary  The first trimester of pregnancy is from week 1 until the end of week 13 (months 1 through 3).  To take care of yourself and your unborn baby, you will need to eat healthy meals, take medicines only if your doctor tells you to do so, and do activities that are safe for you and your baby.  Keep all follow-up visits as told by your doctor. This is important as your doctor will have to ensure that your  baby is healthy and growing well. This information is not intended to replace advice given to you by your health care provider. Make sure you discuss any questions you have with your health care provider. Document Released: 08/04/2007 Document Revised: 02/24/2016 Document Reviewed: 02/24/2016 Elsevier Interactive Patient Education  2017 [employer].

## 2017-08-11 LAB — CYTOLOGY - PAP
Chlamydia: NEGATIVE
Diagnosis: NEGATIVE
NEISSERIA GONORRHEA: NEGATIVE

## 2017-08-17 ENCOUNTER — Telehealth: Payer: Self-pay | Admitting: *Deleted

## 2017-08-17 ENCOUNTER — Other Ambulatory Visit: Payer: Self-pay | Admitting: Family Medicine

## 2017-08-17 DIAGNOSIS — O210 Mild hyperemesis gravidarum: Secondary | ICD-10-CM

## 2017-08-17 MED ORDER — DOXYLAMINE-PYRIDOXINE 10-10 MG PO TBEC: 2.0000 | DELAYED_RELEASE_TABLET | Freq: Every day | ORAL | 0 refills | Status: DC

## 2017-08-17 NOTE — Telephone Encounter (Signed)
Discussed with Dr. Tammi Klippel who will address this.

## 2017-08-17 NOTE — Telephone Encounter (Signed)
PCP is Dr. Dallas Schimke. Will forward to PCP

## 2017-08-17 NOTE — Progress Notes (Signed)
Patient called complaining of worsening nausea.  Informed patient that I would discontinue Unisom and switch therapy to diclegis.  Informed patient to take 2 tablets at bedtime, as this will make her drowsy.  Informed patient that after 2 days of treatment if continued nausea or worsening she may take 1 tablet in the morning and 2 tablets at bedtime.  Inform patient to also continue conservative measures of frequent small meals rather than 3 large meals.  Also encourage patient to continue staying hydrated with fluids.  Encourage patient to try conservative measures of ginger, such as ginger ale to help with nausea.  Patient continues to have nausea even with new treatment she should follow-up in clinic and may need baseline labs to monitor electrolyte levels.  Dalphine Handing, PGY-1 [facility] 08/17/2017 1:49 PM

## 2017-08-17 NOTE — Telephone Encounter (Signed)
Patient called complaining of worsening nausea.  Informed patient that I would discontinue Unisom and switch therapy to diclegis.  Informed patient to take 2 tablets at bedtime, as this will make her drowsy.  Informed patient that after 2 days of treatment if continued nausea or worsening she may take 1 tablet in the morning and 2 tablets at bedtime.  Inform patient to also continue conservative measures of frequent small meals rather than 3 large meals.  Also encourage patient to continue staying hydrated with fluids.  Encourage patient to try conservative measures of ginger, such as ginger ale to help with nausea.  Patient continues to have nausea even with new treatment she should follow-up in clinic and may need baseline labs to monitor electrolyte levels.  Jody Mooney, PGY-1 [facility] 08/17/2017 1:49 PM

## 2017-08-17 NOTE — Telephone Encounter (Signed)
Pt states that the medication that she was told to take for nausea is not helping.  She states that it just makes her sleepy and she can't work like that.  She request that the provider call in zofran for her. Will forward to MD. [place], Salome Spotted, [place]

## 2017-09-08 ENCOUNTER — Other Ambulatory Visit: Payer: Self-pay

## 2017-09-08 ENCOUNTER — Ambulatory Visit (INDEPENDENT_AMBULATORY_CARE_PROVIDER_SITE_OTHER): Payer: BLUE CROSS/BLUE SHIELD | Admitting: Family Medicine

## 2017-09-08 ENCOUNTER — Encounter: Payer: Self-pay | Admitting: Family Medicine

## 2017-09-08 DIAGNOSIS — Z3402 Encounter for supervision of normal first pregnancy, second trimester: Secondary | ICD-10-CM

## 2017-09-08 DIAGNOSIS — Z3400 Encounter for supervision of normal first pregnancy, unspecified trimester: Secondary | ICD-10-CM | POA: Insufficient documentation

## 2017-09-08 NOTE — Progress Notes (Signed)
  Jody Mooney is a 22 y.o. G1P0 at [redacted]w[redacted]d here for routine follow up.  She reports no complaints. Prior episodes of nausea have resolved in the 2nd trimester. See flow sheet for details.  A/P: Pregnancy at [redacted]w[redacted]d.  Doing well.   Pregnancy issues include maternal ovarian cyst. Anatomy ultrasound ordered to be scheduled at 18-19 weeks. Patient is not interested in genetic screening. Bleeding and pain precautions reviewed. Follow up 4 weeks.  Lucila Maine, DO PGY-2, [facility] 09/08/2017 2:05 PM

## 2017-09-08 NOTE — Patient Instructions (Addendum)
It was nice to see you today! We'll see you back in 4 weeks for your next appointment.   If you have questions or concerns please do not hesitate to call at [phone].   Second Trimester of Pregnancy The second trimester is from week 14 through week 27 (months 4 through 6). The second trimester is often a time when you feel your best. Your body has adjusted to being pregnant, and you begin to feel better physically. Usually, morning sickness has lessened or quit completely, you may have more energy, and you may have an increase in appetite. The second trimester is also a time when the fetus is growing rapidly. At the end of the sixth month, the fetus is about 9 inches long and weighs about 1 pounds. You will likely begin to feel the baby move (quickening) between 16 and 20 weeks of pregnancy. Body changes during your second trimester Your body continues to go through many changes during your second trimester. The changes vary from woman to woman.  Your weight will continue to increase. You will notice your lower abdomen bulging out.  You may begin to get stretch marks on your hips, abdomen, and breasts.  You may develop headaches that can be relieved by medicines. The medicines should be approved by your health care provider.  You may urinate more often because the fetus is pressing on your bladder.  You may develop or continue to have heartburn as a result of your pregnancy.  You may develop constipation because certain hormones are causing the muscles that push waste through your intestines to slow down.  You may develop hemorrhoids or swollen, bulging veins (varicose veins).  You may have back pain. This is caused by: ? Weight gain. ? Pregnancy hormones that are relaxing the joints in your pelvis. ? A shift in weight and the muscles that support your balance.  Your breasts will continue to grow and they will continue to become tender.  Your gums may bleed and may be sensitive to  brushing and flossing.  Dark spots or blotches (chloasma, mask of pregnancy) may develop on your face. This will likely fade after the baby is born.  A dark line from your belly button to the pubic area (linea nigra) may appear. This will likely fade after the baby is born.  You may have changes in your hair. These can include thickening of your hair, rapid growth, and changes in texture. Some women also have hair loss during or after pregnancy, or hair that feels dry or thin. Your hair will most likely return to normal after your baby is born.  What to expect at prenatal visits During a routine prenatal visit:  You will be weighed to make sure you and the fetus are growing normally.  Your blood pressure will be taken.  Your abdomen will be measured to track your baby's growth.  The fetal heartbeat will be listened to.  Any test results from the previous visit will be discussed.  Your health care provider may ask you:  How you are feeling.  If you are feeling the baby move.  If you have had any abnormal symptoms, such as leaking fluid, bleeding, severe headaches, or abdominal cramping.  If you are using any tobacco products, including cigarettes, chewing tobacco, and electronic cigarettes.  If you have any questions.  Other tests that may be performed during your second trimester include:  Blood tests that check for: ? Low iron levels (anemia). ? High blood sugar  that affects pregnant women (gestational diabetes) between 78 and 28 weeks. ? Rh antibodies. This is to check for a protein on red blood cells (Rh factor).  Urine tests to check for infections, diabetes, or protein in the urine.  An ultrasound to confirm the proper growth and development of the baby.  An amniocentesis to check for possible genetic problems.  Fetal screens for spina bifida and Down syndrome.  HIV (human immunodeficiency virus) testing. Routine prenatal testing includes screening for HIV, unless  you choose not to have this test.  Follow these instructions at home: Medicines  Follow your health care provider's instructions regarding medicine use. Specific medicines may be either safe or unsafe to take during pregnancy.  Take a prenatal vitamin that contains at least 600 micrograms (mcg) of folic acid.  If you develop constipation, try taking a stool softener if your health care provider approves. Eating and drinking  Eat a balanced diet that includes fresh fruits and vegetables, whole grains, good sources of protein such as meat, eggs, or tofu, and low-fat dairy. Your health care provider will help you determine the amount of weight gain that is right for you.  Avoid raw meat and uncooked cheese. These carry germs that can cause birth defects in the baby.  If you have low calcium intake from food, talk to your health care provider about whether you should take a daily calcium supplement.  Limit foods that are high in fat and processed sugars, such as fried and sweet foods.  To prevent constipation: ? Drink enough fluid to keep your urine clear or pale yellow. ? Eat foods that are high in fiber, such as fresh fruits and vegetables, whole grains, and beans. Activity  Exercise only as directed by your health care provider. Most women can continue their usual exercise routine during pregnancy. Try to exercise for 30 minutes at least 5 days a week. Stop exercising if you experience uterine contractions.  Avoid heavy lifting, wear low heel shoes, and practice good posture.  A sexual relationship may be continued unless your health care provider directs you otherwise. Relieving pain and discomfort  Wear a good support bra to prevent discomfort from breast tenderness.  Take warm sitz baths to soothe any pain or discomfort caused by hemorrhoids. Use hemorrhoid cream if your health care provider approves.  Rest with your legs elevated if you have leg cramps or low back pain.  If  you develop varicose veins, wear support hose. Elevate your feet for 15 minutes, 3-4 times a day. Limit salt in your diet. Prenatal Care  Write down your questions. Take them to your prenatal visits.  Keep all your prenatal visits as told by your health care provider. This is important. Safety  Wear your seat belt at all times when driving.  Make a list of emergency phone numbers, including numbers for family, friends, the hospital, and police and fire departments. General instructions  Ask your health care provider for a referral to a local prenatal education class. Begin classes no later than the beginning of month 6 of your pregnancy.  Ask for help if you have counseling or nutritional needs during pregnancy. Your health care provider can offer advice or refer you to specialists for help with various needs.  Do not use hot tubs, steam rooms, or saunas.  Do not douche or use tampons or scented sanitary pads.  Do not cross your legs for long periods of time.  Avoid cat litter boxes and soil used by  cats. These carry germs that can cause birth defects in the baby and possibly loss of the fetus by miscarriage or stillbirth.  Avoid all smoking, herbs, alcohol, and unprescribed drugs. Chemicals in these products can affect the formation and growth of the baby.  Do not use any products that contain nicotine or tobacco, such as cigarettes and e-cigarettes. If you need help quitting, ask your health care provider.  Visit your dentist if you have not gone yet during your pregnancy. Use a soft toothbrush to brush your teeth and be gentle when you floss. Contact a health care provider if:  You have dizziness.  You have mild pelvic cramps, pelvic pressure, or nagging pain in the abdominal area.  You have persistent nausea, vomiting, or diarrhea.  You have a bad smelling vaginal discharge.  You have pain when you urinate. Get help right away if:  You have a fever.  You are leaking  fluid from your vagina.  You have spotting or bleeding from your vagina.  You have severe abdominal cramping or pain.  You have rapid weight gain or weight loss.  You have shortness of breath with chest pain.  You notice sudden or extreme swelling of your face, hands, ankles, feet, or legs.  You have not felt your baby move in over an hour.  You have severe headaches that do not go away when you take medicine.  You have vision changes. Summary  The second trimester is from week 14 through week 27 (months 4 through 6). It is also a time when the fetus is growing rapidly.  Your body goes through many changes during pregnancy. The changes vary from woman to woman.  Avoid all smoking, herbs, alcohol, and unprescribed drugs. These chemicals affect the formation and growth your baby.  Do not use any tobacco products, such as cigarettes, chewing tobacco, and e-cigarettes. If you need help quitting, ask your health care provider.  Contact your health care provider if you have any questions. Keep all prenatal visits as told by your health care provider. This is important. This information is not intended to replace advice given to you by your health care provider. Make sure you discuss any questions you have with your health care provider. Document Released: 02/09/2001 Document Revised: 03/23/2016 Document Reviewed: 03/23/2016 Elsevier Interactive Patient Education  [employer].

## 2017-10-07 NOTE — Progress Notes (Signed)
Jody Mooney is a 22 y.o. G1P0 at [redacted]w[redacted]d, bu [place], here for routine follow up.  She reports no complaints.  Reports positive fetal movement, no leakage of fluid, no vaginal bleeding, no contractions present.  See flow sheet for details.  A/P: Pregnancy at [redacted]w[redacted]d.  Doing well.   Normal 1 hr glucola Labs reviewed: O positive blood type, RPR NR, Rubella immune, Hep B neg, HIV NR Pregnancy issues include none. Patient is still undecided on birth control.  Patient initially was leaning towards Nexplanon but sister had recent side effects from Nexplanon and now she is unsure. Is planning on breast-feeding only. Anatomy ultrasound ordered to be scheduled at 18-19 weeks.  Ultrasound scheduled for 8/20 at 8:45 AM. Patient is not interested in genetic screening. Bleeding and pain precautions reviewed. Follow up 4 weeks and OB faculty clinic.  Appointment scheduled for patient prior to her leaving for 11/03/17 @ 10AM.  Patient aware of appointment date and time.  Discussed patient with Dr. Nori Riis.

## 2017-10-10 ENCOUNTER — Encounter (HOSPITAL_COMMUNITY): Payer: Self-pay

## 2017-10-10 ENCOUNTER — Ambulatory Visit (INDEPENDENT_AMBULATORY_CARE_PROVIDER_SITE_OTHER): Payer: Self-pay | Admitting: Family Medicine

## 2017-10-10 DIAGNOSIS — Z3402 Encounter for supervision of normal first pregnancy, second trimester: Secondary | ICD-10-CM

## 2017-10-10 NOTE — Patient Instructions (Addendum)
Second Trimester of Pregnancy The second trimester is from week 13 through week 28, month 4 through 6. This is often the time in pregnancy that you feel your best. Often times, morning sickness has lessened or quit. You may have more energy, and you may get hungry more often. Your unborn baby (fetus) is growing rapidly. At the end of the sixth month, he or she is about 9 inches long and weighs about 1 pounds. You will likely feel the baby move (quickening) between 18 and 20 weeks of pregnancy. Follow these instructions at home:  Avoid all smoking, herbs, and alcohol. Avoid drugs not approved by your doctor.  Do not use any tobacco products, including cigarettes, chewing tobacco, and electronic cigarettes. If you need help quitting, ask your doctor. You may get counseling or other support to help you quit.  Only take medicine as told by your doctor. Some medicines are safe and some are not during pregnancy.  Exercise only as told by your doctor. Stop exercising if you start having cramps.  Eat regular, healthy meals.  Wear a good support bra if your breasts are tender.  Do not use hot tubs, steam rooms, or saunas.  Wear your seat belt when driving.  Avoid raw meat, uncooked cheese, and liter boxes and soil used by cats.  Take your prenatal vitamins.  Take 1500-2000 milligrams of calcium daily starting at the 20th week of pregnancy until you deliver your baby.  Try taking medicine that helps you poop (stool softener) as needed, and if your doctor approves. Eat more fiber by eating fresh fruit, vegetables, and whole grains. Drink enough fluids to keep your pee (urine) clear or pale yellow.  Take warm water baths (sitz baths) to soothe pain or discomfort caused by hemorrhoids. Use hemorrhoid cream if your doctor approves.  If you have puffy, bulging veins (varicose veins), wear support hose. Raise (elevate) your feet for 15 minutes, 3-4 times a day. Limit salt in your diet.  Avoid heavy  lifting, wear low heals, and sit up straight.  Rest with your legs raised if you have leg cramps or low back pain.  Visit your dentist if you have not gone during your pregnancy. Use a soft toothbrush to brush your teeth. Be gentle when you floss.  You can have sex (intercourse) unless your doctor tells you not to.  Go to your doctor visits. Get help if:  You feel dizzy.  You have mild cramps or pressure in your lower belly (abdomen).  You have a nagging pain in your belly area.  You continue to feel sick to your stomach (nauseous), throw up (vomit), or have watery poop (diarrhea).  You have bad smelling fluid coming from your vagina.  You have pain with peeing (urination). Get help right away if:  You have a fever.  You are leaking fluid from your vagina.  You have spotting or bleeding from your vagina.  You have severe belly cramping or pain.  You lose or gain weight rapidly.  You have trouble catching your breath and have chest pain.  You notice sudden or extreme puffiness (swelling) of your face, hands, ankles, feet, or legs.  You have not felt the baby move in over an hour.  You have severe headaches that do not go away with medicine.  You have vision changes. This information is not intended to replace advice given to you by your health care provider. Make sure you discuss any questions you have with your health care   provider. Document Released: 05/12/2009 Document Revised: 07/24/2015 Document Reviewed: 04/18/2012 Elsevier Interactive Patient Education  2017 [employer].  It was a pleasure seeing you today.   Please follow up in 4 weeks in [facility] faculty clinic or sooner if symptoms persist or worsen. Please call the clinic immediately if you have any concerns.   Our clinic's number is [phone]. Please call with questions or concerns.   Please go to the emergency room at [facility] hospital if you have any leakage of fluid, bleeding, severe pain, or you  cannot feel baby move.  Thank you,  Caroline More, DO

## 2017-10-18 ENCOUNTER — Ambulatory Visit (HOSPITAL_COMMUNITY)
Admission: RE | Admit: 2017-10-18 | Discharge: 2017-10-18 | Disposition: A | Discharge status: Home / Self Care | Payer: Medicaid Other | Source: Ambulatory Visit | Attending: Family Medicine | Admitting: Family Medicine

## 2017-10-18 DIAGNOSIS — O3483 Maternal care for other abnormalities of pelvic organs, third trimester: Secondary | ICD-10-CM | POA: Insufficient documentation

## 2017-10-18 DIAGNOSIS — Z3402 Encounter for supervision of normal first pregnancy, second trimester: Secondary | ICD-10-CM

## 2017-10-18 DIAGNOSIS — Z369 Encounter for antenatal screening, unspecified: Secondary | ICD-10-CM | POA: Diagnosis not present

## 2017-10-18 DIAGNOSIS — N83291 Other ovarian cyst, right side: Secondary | ICD-10-CM | POA: Insufficient documentation

## 2017-10-18 DIAGNOSIS — Z3A19 19 weeks gestation of pregnancy: Secondary | ICD-10-CM

## 2017-10-18 DIAGNOSIS — Z363 Encounter for antenatal screening for malformations: Secondary | ICD-10-CM | POA: Insufficient documentation

## 2017-10-26 ENCOUNTER — Encounter: Payer: Medicaid Other | Admitting: Family Medicine

## 2017-11-01 ENCOUNTER — Encounter: Payer: Self-pay | Admitting: Family Medicine

## 2017-11-03 ENCOUNTER — Other Ambulatory Visit: Payer: Self-pay

## 2017-11-03 ENCOUNTER — Encounter: Payer: Self-pay | Admitting: Family Medicine

## 2017-11-03 ENCOUNTER — Ambulatory Visit (INDEPENDENT_AMBULATORY_CARE_PROVIDER_SITE_OTHER): Payer: Medicaid Other | Admitting: Family Medicine

## 2017-11-03 VITALS — BP 100/65 | HR 75 | Temp 98.4°F | Wt 187.0 lb

## 2017-11-03 DIAGNOSIS — N83209 Unspecified ovarian cyst, unspecified side: Secondary | ICD-10-CM | POA: Insufficient documentation

## 2017-11-03 DIAGNOSIS — Z23 Encounter for immunization: Secondary | ICD-10-CM | POA: Diagnosis not present

## 2017-11-03 DIAGNOSIS — N3000 Acute cystitis without hematuria: Secondary | ICD-10-CM | POA: Insufficient documentation

## 2017-11-03 DIAGNOSIS — Z3402 Encounter for supervision of normal first pregnancy, second trimester: Secondary | ICD-10-CM

## 2017-11-03 DIAGNOSIS — N83201 Unspecified ovarian cyst, right side: Secondary | ICD-10-CM

## 2017-11-03 NOTE — Progress Notes (Signed)
Jody Mooney is a 22 y.o. G1P0 at [redacted]w[redacted]d here for routine follow up.  She reports no vaginal bleeding, loss of fluid, no contractions, and reports good fetal movement. Vitals reviewed, see flow chart for details.  Objective General: NAD Cardio: RRR, no murmurs Resp: CTAB, NWOB FHT: 21cm Fundal height: 125 - 137bpm  A/P: Pregnancy at [redacted]w[redacted]d.  Doing well.   Pregnancy issues include getting a urine culture today as previously collected urine culture was not sent to lab. U/S results reviewed today with patient. Patient will need post partum abdominal/pelvic MRI to due to incidental finding of ovarian cyst found on ultrasound. This was discussed with the patient. Preterm labor and fetal movement precautions reviewed. Follow up in 4 weeks.

## 2017-11-03 NOTE — Patient Instructions (Addendum)
It was great to meet you today! Thank you for letting me participate in your care!  Today, we discussed your current pregnancy and you are doing great! Keep up the good work and please never hesitate to contact us if you have concerns or questions. If your repeat urine culture is abnormal we will contact you. Otherwise, please return in 4 weeks.   Second Trimester of Pregnancy The second trimester is from week 13 through week 28, month 4 through 6. This is often the time in pregnancy that you feel your best. Often times, morning sickness has lessened or quit. You may have more energy, and you may get hungry more often. Your unborn baby (fetus) is growing rapidly. At the end of the sixth month, he or she is about 9 inches long and weighs about 1 pounds. You will likely feel the baby move (quickening) between 18 and 20 weeks of pregnancy. Follow these instructions at home:  Avoid all smoking, herbs, and alcohol. Avoid drugs not approved by your doctor.  Do not use any tobacco products, including cigarettes, chewing tobacco, and electronic cigarettes. If you need help quitting, ask your doctor. You may get counseling or other support to help you quit.  Only take medicine as told by your doctor. Some medicines are safe and some are not during pregnancy.  Exercise only as told by your doctor. Stop exercising if you start having cramps.  Eat regular, healthy meals.  Wear a good support bra if your breasts are tender.  Do not use hot tubs, steam rooms, or saunas.  Wear your seat belt when driving.  Avoid raw meat, uncooked cheese, and liter boxes and soil used by cats.  Take your prenatal vitamins.  Take 1500-2000 milligrams of calcium daily starting at the 20th week of pregnancy until you deliver your baby.  Try taking medicine that helps you poop (stool softener) as needed, and if your doctor approves. Eat more fiber by eating fresh fruit, vegetables, and whole grains. Drink enough fluids  to keep your pee (urine) clear or pale yellow.  Take warm water baths (sitz baths) to soothe pain or discomfort caused by hemorrhoids. Use hemorrhoid cream if your doctor approves.  If you have puffy, bulging veins (varicose veins), wear support hose. Raise (elevate) your feet for 15 minutes, 3-4 times a day. Limit salt in your diet.  Avoid heavy lifting, wear low heals, and sit up straight.  Rest with your legs raised if you have leg cramps or low back pain.  Visit your dentist if you have not gone during your pregnancy. Use a soft toothbrush to brush your teeth. Be gentle when you floss.  You can have sex (intercourse) unless your doctor tells you not to.  Go to your doctor visits. Get help if:  You feel dizzy.  You have mild cramps or pressure in your lower belly (abdomen).  You have a nagging pain in your belly area.  You continue to feel sick to your stomach (nauseous), throw up (vomit), or have watery poop (diarrhea).  You have bad smelling fluid coming from your vagina.  You have pain with peeing (urination). Get help right away if:  You have a fever.  You are leaking fluid from your vagina.  You have spotting or bleeding from your vagina.  You have severe belly cramping or pain.  You lose or gain weight rapidly.  You have trouble catching your breath and have chest pain.  You notice sudden or extreme puffiness (swelling)  of your face, hands, ankles, feet, or legs.  You have not felt the baby move in over an hour.  You have severe headaches that do not go away with medicine.  You have vision changes. This information is not intended to replace advice given to you by your health care provider. Make sure you discuss any questions you have with your health care provider. Document Released: 05/12/2009 Document Revised: 07/24/2015 Document Reviewed: 04/18/2012 Elsevier Interactive Patient Education  2017 [place] [employer], Harolyn Rutherford, DO PGY-2, [facility]

## 2017-11-03 NOTE — Progress Notes (Signed)
I have personally examined the patient. I agree with assessment and plan.

## 2017-11-03 NOTE — Addendum Note (Signed)
Addended by: Owens Shark, CARINA on: 11/03/2017 12:27 PM   Modules accepted: Level of Service

## 2017-11-05 LAB — CULTURE, OB URINE

## 2017-11-05 LAB — URINE CULTURE, OB REFLEX

## 2017-11-06 ENCOUNTER — Other Ambulatory Visit: Payer: Self-pay | Admitting: Family Medicine

## 2017-11-06 DIAGNOSIS — R8271 Bacteriuria: Secondary | ICD-10-CM

## 2017-11-06 DIAGNOSIS — O9989 Other specified diseases and conditions complicating pregnancy, childbirth and the puerperium: Principal | ICD-10-CM

## 2017-11-07 NOTE — Progress Notes (Signed)
Pt coming in tomorrow

## 2017-11-08 ENCOUNTER — Ambulatory Visit (INDEPENDENT_AMBULATORY_CARE_PROVIDER_SITE_OTHER): Payer: Medicaid Other | Admitting: Family Medicine

## 2017-11-08 ENCOUNTER — Other Ambulatory Visit: Payer: Self-pay

## 2017-11-08 ENCOUNTER — Other Ambulatory Visit (HOSPITAL_COMMUNITY)
Admission: RE | Admit: 2017-11-08 | Discharge: 2017-11-08 | Disposition: A | Discharge status: Home / Self Care | Payer: Medicaid Other | Source: Ambulatory Visit | Attending: Family Medicine | Admitting: Family Medicine

## 2017-11-08 VITALS — BP 120/64 | Temp 98.3°F | Wt 181.6 lb

## 2017-11-08 DIAGNOSIS — B9689 Other specified bacterial agents as the cause of diseases classified elsewhere: Secondary | ICD-10-CM

## 2017-11-08 DIAGNOSIS — O26892 Other specified pregnancy related conditions, second trimester: Secondary | ICD-10-CM | POA: Diagnosis not present

## 2017-11-08 DIAGNOSIS — N898 Other specified noninflammatory disorders of vagina: Secondary | ICD-10-CM | POA: Diagnosis not present

## 2017-11-08 DIAGNOSIS — R8279 Other abnormal findings on microbiological examination of urine: Secondary | ICD-10-CM | POA: Insufficient documentation

## 2017-11-08 DIAGNOSIS — N760 Acute vaginitis: Secondary | ICD-10-CM

## 2017-11-08 DIAGNOSIS — Z3A21 21 weeks gestation of pregnancy: Secondary | ICD-10-CM | POA: Diagnosis not present

## 2017-11-08 DIAGNOSIS — B3731 Acute candidiasis of vulva and vagina: Secondary | ICD-10-CM

## 2017-11-08 DIAGNOSIS — B373 Candidiasis of vulva and vagina: Secondary | ICD-10-CM | POA: Diagnosis not present

## 2017-11-08 LAB — POCT WET PREP (WET MOUNT)
CLUE CELLS WET PREP WHIFF POC: POSITIVE
TRICHOMONAS WET PREP HPF POC: ABSENT

## 2017-11-08 MED ORDER — CLINDAMYCIN PHOSPHATE 2 % VA CREA: 1.0000 | TOPICAL_CREAM | Freq: Every day | VAGINAL | 0 refills | Status: DC

## 2017-11-08 MED ORDER — MICONAZOLE NITRATE 2 % VA CREA: 1.0000 | TOPICAL_CREAM | Freq: Every day | VAGINAL | 0 refills | Status: AC

## 2017-11-08 NOTE — Assessment & Plan Note (Signed)
Patient with vaginal discharged noted during intercourse. No tenderness on exam. Thick white discharge noted on speculum exam. Wet prep showing positive clue cells and yeast giving likely diagnosis of yeast and BV. Given that patient is symptomatic will plan to treat with topical clindamycin and miconazole. Will plan to also test for Gonorrhea and Chlamydia.

## 2017-11-08 NOTE — Assessment & Plan Note (Addendum)
Patient with positive urine culture for lactobacillus species on 9/5. Per notes from Dr. Gwendlyn Deutscher "A repeated isolation of Lactobacillus with high colony counts and without other organisms in consecutive urine cultures may not represent simple contamination and treatment should be reserved for patients in whom the organism grows as a single isolate on consecutive cultures". Urine sample given today for repeat testing. Order already in Epic for culture, ordered by Dr. Gwendlyn Deutscher

## 2017-11-08 NOTE — Patient Instructions (Signed)
It was a pleasure seeing you today.   Today we discussed your urine and vaginal discharge  For your white discharge: I have obtained labs and will either give you a call or send a letter with results.   Please follow up in 1 week if no improvement or sooner if symptoms persist or worsen. Please call the clinic immediately if you have any concerns.   Our clinic's number is [phone]. Please call with questions or concerns.   Please go to the emergency room if you have any leakage of fluid, bleeding, or cannot feel the baby move as much  Thank you,  Caroline More, DO

## 2017-11-08 NOTE — Progress Notes (Signed)
   Subjective:    Patient ID: Jody Mooney, female    DOB: 1996/02/24, 22 y.o.   MRN: [id number]   CC: repeat urine culture and concern for yeast infection   HPI: Concern for yeast infection Patient presenting today with concerns of yeast infection.  Patient has had yeast infections in the past and feels like this is just an overgrowth of yeast.  Symptoms have been present for 2 weeks.  Denies any vaginal discharge but says there is almost a "coating inside".  Patient noted coating during intercourse and when she performed exam on herself. States that it looks like an overgrowth of yeast noticed as it is thick and white.  Denies any discomfort, itching, burning.  Denies any urinary symptoms.  Denies any fluid leakage.  Patient can feel baby moving well, sometimes has increased activity around 4:30 AM.  Patient was seen in the [facility] on 6/11 and was diagnosed with BV and yeast infection and she thinks this feels very similar. Patient is sexually active but denies any exposure to STD.   Repeat urine culture  Patient presenting today for repeat urine culture.  Her notes recorded by Dr. Gwendlyn Deutscher on 9/5 a repeated isolation of lactobacillus with high colony counts and without other organisms would mean that patient would need treatment.  Culture from 9/5 growing lactobacillus species.  She denies any urinary symptoms.  Objective:  BP 120/64 (BP Location: Right Arm, Patient Position: Sitting, Cuff Size: Normal)   Temp 98.3 F (36.8 C) (Oral)   Wt 181 lb 9.6 oz (82.4 kg)   LMP 06/09/2017   BMI 32.17 kg/m  Vitals and nursing note reviewed  General: well nourished, in no acute distress Skin: warm and dry, no rashes noted Neuro: alert and oriented, no focal deficits Female genitalia: normal external genitalia, vulva, vagina, cervix, uterus and adnexa. Thick white discharge noted on speculum exam. No tenderness on bimanual exam  FHR: 143   Assessment & Plan:    Positive urine culture Patient with  positive urine culture for lactobacillus species on 9/5. Per notes from Dr. Gwendlyn Deutscher "A repeated isolation of Lactobacillus with high colony counts and without other organisms in consecutive urine cultures may not represent simple contamination and treatment should be reserved for patients in whom the organism grows as a single isolate on consecutive cultures". Urine sample given today for repeat testing. Order already in [place] for culture, ordered by Dr. Gwendlyn Deutscher  Vaginal discharge Patient with vaginal discharged noted during intercourse. No tenderness on exam. Thick white discharge noted on speculum exam. Wet prep showing positive clue cells and yeast giving likely diagnosis of yeast and BV. Given that patient is symptomatic will plan to treat with topical clindamycin and miconazole. Will plan to also test for Gonorrhea and Chlamydia.     Return in about 1 week (around 11/15/2017), or if symptoms worsen or fail to improve.   Caroline More, DO, PGY-2

## 2017-11-10 ENCOUNTER — Encounter: Payer: Self-pay | Admitting: Family Medicine

## 2017-11-10 LAB — CERVICOVAGINAL ANCILLARY ONLY
CHLAMYDIA, DNA PROBE: NEGATIVE
NEISSERIA GONORRHEA: NEGATIVE

## 2017-11-19 ENCOUNTER — Encounter (HOSPITAL_COMMUNITY): Payer: Self-pay | Admitting: *Deleted

## 2017-11-19 ENCOUNTER — Inpatient Hospital Stay (HOSPITAL_COMMUNITY)
Admission: AD | Admit: 2017-11-19 | Discharge: 2017-11-19 | Disposition: A | Discharge status: Home / Self Care | Payer: Medicaid Other | Source: Ambulatory Visit | Attending: Obstetrics & Gynecology | Admitting: Obstetrics & Gynecology

## 2017-11-19 DIAGNOSIS — R1032 Left lower quadrant pain: Secondary | ICD-10-CM

## 2017-11-19 DIAGNOSIS — O26892 Other specified pregnancy related conditions, second trimester: Secondary | ICD-10-CM | POA: Diagnosis not present

## 2017-11-19 DIAGNOSIS — Z880 Allergy status to penicillin: Secondary | ICD-10-CM | POA: Diagnosis not present

## 2017-11-19 DIAGNOSIS — Z3A23 23 weeks gestation of pregnancy: Secondary | ICD-10-CM | POA: Insufficient documentation

## 2017-11-19 LAB — URINALYSIS, ROUTINE W REFLEX MICROSCOPIC
BILIRUBIN URINE: NEGATIVE
Glucose, UA: NEGATIVE mg/dL
Hgb urine dipstick: NEGATIVE
Ketones, ur: NEGATIVE mg/dL
Leukocytes, UA: NEGATIVE
NITRITE: NEGATIVE
Protein, ur: NEGATIVE mg/dL
SPECIFIC GRAVITY, URINE: 1.009 (ref 1.005–1.030)
pH: 8 (ref 5.0–8.0)

## 2017-11-19 NOTE — MAU Provider Note (Signed)
History   22-year-old G1 at 23 weeks and 5 days in with left lower quadrant pain for 3 to 4 days now.  She states it is worse when she is on her feet and when the baby is active.  She denies any vaginal bleeding or rupture membranes.  CSN: [id number]  Arrival date & time 11/19/17  1158   None     Chief Complaint  Patient presents with  . Abdominal Pain  . vaginal pressure    HPI  Past Medical History:  Diagnosis Date  . Asthma   . Medical history non-contributory     Past Surgical History:  Procedure Laterality Date  . WISDOM TOOTH EXTRACTION      Family History  Problem Relation Age of Onset  . Hyperlipidemia Mother   . Anxiety disorder Unknown        Grandmother and mother  . Depression Unknown        Grandmother and mother  . Hyperlipidemia Unknown   . Diabetes type II Unknown        Grand parents  . Other Unknown        VSD repaired in grandmother age 53    Social History   Tobacco Use  . Smoking status: Never Smoker  . Smokeless tobacco: Never Used  Substance Use Topics  . Alcohol use: No  . Drug use: Not on file    OB History    Gravida  1   Para      Term      Preterm      AB      Living        SAB      TAB      Ectopic      Multiple      Live Births              Review of Systems  Constitutional: Negative.   HENT: Negative.   Eyes: Negative.   Respiratory: Negative.   Cardiovascular: Negative.   Gastrointestinal: Positive for abdominal pain.  Endocrine: Negative.   Genitourinary: Negative.   Musculoskeletal: Negative.   Skin: Negative.   Allergic/Immunologic: Negative.   Neurological: Negative.   Hematological: Negative.   Psychiatric/Behavioral: Negative.     Allergies  Penicillins  Home Medications    BP 112/64 (BP Location: Right Arm)   Pulse 79   Temp 97.9 F (36.6 C) (Oral)   Resp 19   Wt 87.1 kg   LMP 06/09/2017   SpO2 97%   BMI 34.02 kg/m   Physical Exam  Constitutional: She is oriented to  person, place, and time. She appears well-developed and well-nourished.  HENT:  Head: Normocephalic.  Eyes: Pupils are equal, round, and reactive to light.  Cardiovascular: Normal rate, regular rhythm, normal heart sounds and intact distal pulses.  Pulmonary/Chest: Effort normal and breath sounds normal.  Abdominal: Soft. Normal appearance and bowel sounds are normal. There is no tenderness.  Genitourinary: Vagina normal and uterus normal.  Neurological: She is alert and oriented to person, place, and time.  Skin: Skin is warm and dry.  Psychiatric: She has a normal mood and affect. Her behavior is normal.    MAU Course  Procedures (including critical care time)  Labs Reviewed  URINALYSIS, ROUTINE W REFLEX MICROSCOPIC - Abnormal; Notable for the following components:      Result Value   APPearance CLOUDY (*)    All other components within normal limits   No results found.  1. Left lower quadrant abdominal pain during pregnancy in second trimester       MDM  Vital signs are stable.  Urinalysis is clear.  Fetal heart rate pattern is within normal limits no decelerations.  No uterine contractions.  Sterile vaginal exam was performed cervix is firm closed posterior and high.  Will discharge patient home in stable condition

## 2017-11-19 NOTE — MAU Note (Signed)
Jody Mooney is a 22 y.o. at [redacted]w[redacted]d here in MAU reporting: Left sided abdominal pain and increased vaginal pressure. Onset of complaint: ongoing throughout past week. Both symptoms intermittent in nature. Notices it is worse with activities. Pain score: 8/10 when happens Vitals:   11/19/17 1222  BP: 126/78  Pulse: 87  Resp: 19  Temp: 97.9 F (36.6 C)  SpO2: 97%     FHT:146 Lab orders placed from triage: ua

## 2017-11-19 NOTE — Discharge Instructions (Signed)

## 2017-11-21 ENCOUNTER — Encounter (HOSPITAL_COMMUNITY): Payer: Self-pay

## 2017-12-05 ENCOUNTER — Ambulatory Visit (INDEPENDENT_AMBULATORY_CARE_PROVIDER_SITE_OTHER): Payer: Medicaid Other | Admitting: Family Medicine

## 2017-12-05 ENCOUNTER — Other Ambulatory Visit: Payer: Self-pay

## 2017-12-05 VITALS — BP 110/62 | HR 83 | Temp 97.7°F | Wt 198.0 lb

## 2017-12-05 DIAGNOSIS — N898 Other specified noninflammatory disorders of vagina: Secondary | ICD-10-CM | POA: Diagnosis not present

## 2017-12-05 DIAGNOSIS — Z3402 Encounter for supervision of normal first pregnancy, second trimester: Secondary | ICD-10-CM

## 2017-12-05 LAB — POCT WET PREP (WET MOUNT)
Clue Cells Wet Prep Whiff POC: NEGATIVE
Trichomonas Wet Prep HPF POC: ABSENT

## 2017-12-05 LAB — POCT 1 HR PRENATAL GLUCOSE: Glucose 1 Hr Prenatal, POC: 103 mg/dL

## 2017-12-05 MED ORDER — MICONAZOLE NITRATE 2 % VA CREA: 1.0000 | TOPICAL_CREAM | Freq: Every day | VAGINAL | 0 refills | Status: DC

## 2017-12-05 NOTE — Progress Notes (Signed)
  Jody Mooney is a 22 y.o. G1P0 at [redacted]w[redacted]d here for routine follow up.  She reports positive fetal movement, no vaginal bleeding, no LOF, no contractions. See flow sheet for details.  She had taken yeast medication for 1 week and noticed a return of yeast afterward. She was treated for both BV and yeast recently.  She wants to be checked to make sure it is completely gone. She denies discharge. No pelvic pain.   A/P: Pregnancy at [redacted]w[redacted]d.  Doing well.   Pregnancy issues include ovarian cyst, will need follow up imaging after baby is born.  Vaginal discharge- patient with yeast on wet prep. Will treat with miconazole every night for 7 days. If continues to recur would consider oral diflucan.  Preterm labor and fetal movement precautions reviewed. Follow up 4 weeks.  Lucila Maine, DO PGY-3, [facility] 12/05/2017 12:06 PM

## 2017-12-05 NOTE — Progress Notes (Signed)
Please let pt know wet prep + yeast again. Sent rx for monistat 7 to pharmacy.

## 2017-12-05 NOTE — Patient Instructions (Signed)

## 2017-12-07 ENCOUNTER — Other Ambulatory Visit: Payer: Self-pay | Admitting: Family Medicine

## 2017-12-07 ENCOUNTER — Telehealth: Payer: Self-pay | Admitting: Family Medicine

## 2017-12-07 ENCOUNTER — Other Ambulatory Visit: Payer: Medicaid Other

## 2017-12-07 DIAGNOSIS — O99891 Other specified diseases and conditions complicating pregnancy: Secondary | ICD-10-CM

## 2017-12-07 DIAGNOSIS — O9989 Other specified diseases and conditions complicating pregnancy, childbirth and the puerperium: Principal | ICD-10-CM

## 2017-12-07 DIAGNOSIS — R8271 Bacteriuria: Secondary | ICD-10-CM

## 2017-12-07 NOTE — Telephone Encounter (Signed)
She never got the repeat urine culture.  I called to f/u with her. She will return today for her repeat culture.

## 2017-12-15 ENCOUNTER — Encounter (HOSPITAL_COMMUNITY): Payer: Self-pay | Admitting: *Deleted

## 2017-12-15 ENCOUNTER — Inpatient Hospital Stay (HOSPITAL_COMMUNITY)
Admission: AD | Admit: 2017-12-15 | Discharge: 2017-12-15 | Disposition: A | Discharge status: Home / Self Care | Payer: Medicaid Other | Source: Ambulatory Visit | Attending: Obstetrics and Gynecology | Admitting: Obstetrics and Gynecology

## 2017-12-15 DIAGNOSIS — B373 Candidiasis of vulva and vagina: Secondary | ICD-10-CM | POA: Diagnosis not present

## 2017-12-15 DIAGNOSIS — Z3A27 27 weeks gestation of pregnancy: Secondary | ICD-10-CM

## 2017-12-15 DIAGNOSIS — O98812 Other maternal infectious and parasitic diseases complicating pregnancy, second trimester: Secondary | ICD-10-CM

## 2017-12-15 DIAGNOSIS — N898 Other specified noninflammatory disorders of vagina: Secondary | ICD-10-CM | POA: Diagnosis present

## 2017-12-15 DIAGNOSIS — B3731 Acute candidiasis of vulva and vagina: Secondary | ICD-10-CM

## 2017-12-15 LAB — URINALYSIS, ROUTINE W REFLEX MICROSCOPIC
Bilirubin Urine: NEGATIVE
Glucose, UA: NEGATIVE mg/dL
HGB URINE DIPSTICK: NEGATIVE
Ketones, ur: NEGATIVE mg/dL
NITRITE: NEGATIVE
PROTEIN: NEGATIVE mg/dL
SPECIFIC GRAVITY, URINE: 1.02 (ref 1.005–1.030)
pH: 5 (ref 5.0–8.0)

## 2017-12-15 LAB — WET PREP, GENITAL
Clue Cells Wet Prep HPF POC: NONE SEEN
Sperm: NONE SEEN
TRICH WET PREP: NONE SEEN

## 2017-12-15 LAB — OB RESULTS CONSOLE GC/CHLAMYDIA: Gonorrhea: NEGATIVE

## 2017-12-15 MED ORDER — TERCONAZOLE 0.4 % VA CREA: 1.0000 | TOPICAL_CREAM | Freq: Every day | VAGINAL | 1 refills | Status: AC

## 2017-12-15 MED ORDER — PROBIOTIC & ACIDOPHILUS EX ST PO CAPS: 1.0000 | ORAL_CAPSULE | Freq: Three times a day (TID) | ORAL | 3 refills | Status: DC

## 2017-12-15 NOTE — MAU Provider Note (Signed)
History     CSN: [id number]  Arrival date and time: 12/15/17 7564   First Provider Initiated Contact with Patient 12/15/17 2047      Chief Complaint  Patient presents with  . Vaginal Discharge   Jody Mooney is a 22 y.o. G1P0 at [redacted]w[redacted]d who presents today with watery vaginal discharge x 4 days. She is concerned that she may have broken her water. She has been treated for yeast and BV multiple times in this pregnancy. She denies any VB or contractions. She reports normal fetal movement.   Vaginal Discharge  The patient's primary symptoms include vaginal discharge. The patient's pertinent negatives include no pelvic pain. This is a new problem. Episode onset: 4 days  The problem occurs constantly. The problem has been unchanged. The patient is experiencing no pain. She is pregnant. Pertinent negatives include no chills, dysuria, fever, frequency, nausea, urgency or vomiting. The vaginal discharge was watery, white and thick. There has been no bleeding. Nothing aggravates the symptoms. She has tried nothing for the symptoms. Sexual activity: denies intercourse in the last 24 hours     OB History    Gravida  1   Para      Term      Preterm      AB      Living        SAB      TAB      Ectopic      Multiple      Live Births              Past Medical History:  Diagnosis Date  . Asthma   . Medical history non-contributory     Past Surgical History:  Procedure Laterality Date  . WISDOM TOOTH EXTRACTION      Family History  Problem Relation Age of Onset  . Hyperlipidemia Mother   . Anxiety disorder Unknown        Grandmother and mother  . Depression Unknown        Grandmother and mother  . Hyperlipidemia Unknown   . Diabetes type II Unknown        Grand parents  . Other Unknown        VSD repaired in grandmother age 71    Social History   Tobacco Use  . Smoking status: Never Smoker  . Smokeless tobacco: Never Used  Substance Use Topics  . Alcohol  use: No  . Drug use: Never    Allergies:  Allergies  Allergen Reactions  . Penicillins     Rash    Medications Prior to Admission  Medication Sig Dispense Refill Last Dose  . albuterol (PROVENTIL HFA;VENTOLIN HFA) 108 (90 Base) MCG/ACT inhaler Inhale 2 puffs into the lungs every 6 (six) hours as needed. 1 Inhaler 3 More than a month at Unknown time  . carbamide peroxide (DEBROX) 6.5 % OTIC solution Place 5 drops into both ears 2 (two) times daily. 15 mL 0 More than a month at Unknown time  . clindamycin (CLEOCIN) 2 % vaginal cream Place 1 Applicatorful vaginally at bedtime. 40 g 0 11/18/2017 at Unknown time  . Doxylamine-Pyridoxine (DICLEGIS) 10-10 MG TBEC Take 2 tablets by mouth at bedtime. If continued/worsening after 2 days, take 1 tab in the morning and 2 tabs at bedtime 60 tablet 0 More than a month at Unknown time  . miconazole (MICONAZOLE 7) 2 % vaginal cream Place 1 Applicatorful vaginally at bedtime. 45 g 0   . Prenatal  Vit-Fe Fumarate-FA (PRENATAL MULTIVITAMIN) TABS tablet Take 1 tablet by mouth daily at 12 noon. 60 tablet 3 More than a month at Unknown time    Review of Systems  Constitutional: Negative for chills and fever.  Gastrointestinal: Negative for nausea and vomiting.  Genitourinary: Positive for vaginal discharge. Negative for dysuria, frequency, pelvic pain, urgency and vaginal bleeding.   Physical Exam   Blood pressure 125/76, pulse 79, temperature 98.5 F (36.9 C), resp. rate 19, height 5\' 4"  (1.626 m), weight 91.1 kg, last menstrual period 06/09/2017, SpO2 100 %.  Physical Exam  Nursing note and vitals reviewed. Constitutional: She is oriented to person, place, and time. She appears well-developed and well-nourished. No distress.  HENT:  Head: Normocephalic.  Cardiovascular: Normal rate.  Respiratory: Effort normal.  GI: Soft. There is no tenderness. There is no rebound.  Genitourinary:  Genitourinary Comments:  External: no lesion Vagina:large amount  of thick, white clumpy. adherent discharge. No pooling, no fluid seen  Cervix: pink, smooth, no fluid with valsalva. Closed/thick  Uterus: AGA  Neurological: She is alert and oriented to person, place, and time.  Skin: Skin is warm and dry.  Psychiatric: She has a normal mood and affect.     NST:  Baseline: 140 Variability: moderate Accels: 15x15 Decels: none Toco: none   Results for orders placed or performed during the hospital encounter of 12/15/17 (from the past 24 hour(s))  Urinalysis, Routine w reflex microscopic     Status: Abnormal   Collection Time: 12/15/17  8:03 PM  Result Value Ref Range   Color, Urine YELLOW YELLOW   APPearance HAZY (A) CLEAR   Specific Gravity, Urine 1.020 1.005 - 1.030   pH 5.0 5.0 - 8.0   Glucose, UA NEGATIVE NEGATIVE mg/dL   Hgb urine dipstick NEGATIVE NEGATIVE   Bilirubin Urine NEGATIVE NEGATIVE   Ketones, ur NEGATIVE NEGATIVE mg/dL   Protein, ur NEGATIVE NEGATIVE mg/dL   Nitrite NEGATIVE NEGATIVE   Leukocytes, UA SMALL (A) NEGATIVE   RBC / HPF 0-5 0 - 5 RBC/hpf   WBC, UA 0-5 0 - 5 WBC/hpf   Bacteria, UA RARE (A) NONE SEEN   Squamous Epithelial / LPF 0-5 0 - 5   Mucus PRESENT      MAU Course  Procedures  MDM   Assessment and Plan   1. Yeast infection involving the vagina and surrounding area   2. [redacted] weeks gestation of pregnancy    DC home Comfort measures reviewed  3rd Trimester precautions  PTL precautions  Fetal kick counts RX: terazol 7 as qhs x 7 days, probiotic supplement TID #90  Return to MAU as needed FU with OB as planned  Follow-up Information    [place] Follow up.   Contact information: [place] 12/15/2017, 9:12 PM

## 2017-12-15 NOTE — Discharge Instructions (Signed)

## 2017-12-15 NOTE — MAU Note (Signed)
Having clear, watery discharge for the past 4 days.  + FM.  Not feeling any ctx right now.  No VB.

## 2017-12-16 LAB — GC/CHLAMYDIA PROBE AMP (~~LOC~~) NOT AT ARMC
CHLAMYDIA, DNA PROBE: NEGATIVE
NEISSERIA GONORRHEA: NEGATIVE

## 2018-01-02 ENCOUNTER — Encounter (HOSPITAL_COMMUNITY): Payer: Self-pay | Admitting: Student

## 2018-01-02 ENCOUNTER — Inpatient Hospital Stay (HOSPITAL_COMMUNITY)
Admission: AD | Admit: 2018-01-02 | Discharge: 2018-01-02 | Disposition: A | Discharge status: Home / Self Care | Payer: Medicaid Other | Source: Ambulatory Visit | Attending: Obstetrics & Gynecology | Admitting: Obstetrics & Gynecology

## 2018-01-02 ENCOUNTER — Other Ambulatory Visit: Payer: Self-pay

## 2018-01-02 DIAGNOSIS — O4693 Antepartum hemorrhage, unspecified, third trimester: Secondary | ICD-10-CM | POA: Insufficient documentation

## 2018-01-02 DIAGNOSIS — Z3A30 30 weeks gestation of pregnancy: Secondary | ICD-10-CM

## 2018-01-02 DIAGNOSIS — O98813 Other maternal infectious and parasitic diseases complicating pregnancy, third trimester: Secondary | ICD-10-CM | POA: Diagnosis not present

## 2018-01-02 DIAGNOSIS — B373 Candidiasis of vulva and vagina: Secondary | ICD-10-CM | POA: Insufficient documentation

## 2018-01-02 DIAGNOSIS — B3731 Acute candidiasis of vulva and vagina: Secondary | ICD-10-CM

## 2018-01-02 LAB — URINALYSIS, ROUTINE W REFLEX MICROSCOPIC
BILIRUBIN URINE: NEGATIVE
GLUCOSE, UA: NEGATIVE mg/dL
HGB URINE DIPSTICK: NEGATIVE
KETONES UR: NEGATIVE mg/dL
Nitrite: NEGATIVE
PH: 6 (ref 5.0–8.0)
PROTEIN: NEGATIVE mg/dL
SPECIFIC GRAVITY, URINE: 1.016 (ref 1.005–1.030)

## 2018-01-02 MED ORDER — TERCONAZOLE 0.8 % VA CREA: 1.0000 | TOPICAL_CREAM | Freq: Every day | VAGINAL | 0 refills | Status: DC

## 2018-01-02 NOTE — MAU Provider Note (Signed)
Chief Complaint:  Abdominal Pain and Vaginal Bleeding   First Provider Initiated Contact with Patient 01/02/18 1923     HPI: Jody Mooney is a 22 y.o. G1P0 at [redacted]w[redacted]d who presents to maternity admissions reporting vaginal bleeding. Was treated for yeast a few weeks ago but couldn't afford the rx so was using some left over monistat.  Reports postcoital bleeding 4 nights ago that resolved. Went to restroom this evening and saw bright red blood on toilet paper. Denies LOF or abdominal pain. Positive fetal movement.    Past Medical History:  Diagnosis Date  . Asthma    OB History  Gravida Para Term Preterm AB Living  1            SAB TAB Ectopic Multiple Live Births               # Outcome Date GA Lbr Len/2nd Weight Sex Delivery Anes PTL Lv  1 Current            Past Surgical History:  Procedure Laterality Date  . WISDOM TOOTH EXTRACTION     Family History  Problem Relation Age of Onset  . Hyperlipidemia Mother   . Anxiety disorder Unknown        Grandmother and mother  . Depression Unknown        Grandmother and mother  . Hyperlipidemia Unknown   . Diabetes type II Unknown        Grand parents  . Other Unknown        VSD repaired in grandmother age 56   Social History   Tobacco Use  . Smoking status: Never Smoker  . Smokeless tobacco: Never Used  Substance Use Topics  . Alcohol use: No  . Drug use: Never   Allergies  Allergen Reactions  . Penicillins     Rash   Medications Prior to Admission  Medication Sig Dispense Refill Last Dose  . albuterol (PROVENTIL HFA;VENTOLIN HFA) 108 (90 Base) MCG/ACT inhaler Inhale 2 puffs into the lungs every 6 (six) hours as needed. 1 Inhaler 3 More than a month at Unknown time  . carbamide peroxide (DEBROX) 6.5 % OTIC solution Place 5 drops into both ears 2 (two) times daily. 15 mL 0 More than a month at Unknown time  . Doxylamine-Pyridoxine (DICLEGIS) 10-10 MG TBEC Take 2 tablets by mouth at bedtime. If continued/worsening after 2  days, take 1 tab in the morning and 2 tabs at bedtime 60 tablet 0 More than a month at Unknown time  . Prenatal Vit-Fe Fumarate-FA (PRENATAL MULTIVITAMIN) TABS tablet Take 1 tablet by mouth daily at 12 noon. 60 tablet 3 More than a month at Unknown time  . Probiotic Product (PROBIOTIC & ACIDOPHILUS EX ST) CAPS Take 1 capsule by mouth 3 (three) times daily. 90 capsule 3     I have reviewed patient's Past Medical Hx, Surgical Hx, Family Hx, Social Hx, medications and allergies.   ROS:  Review of Systems  Constitutional: Negative.   Gastrointestinal: Negative.   Genitourinary: Positive for vaginal bleeding and vaginal discharge. Negative for dysuria.    Physical Exam   Patient Vitals for the past 24 hrs:  BP Temp Temp src Pulse Resp SpO2 Weight  01/02/18 1950 120/71 - - 80 16 - -  01/02/18 1857 122/71 98.4 F (36.9 C) Oral 83 18 100 % 92.2 kg    Constitutional: Well-developed, well-nourished female in no acute distress.  Cardiovascular: normal rate & rhythm, no murmur Respiratory: normal effort,  lung sounds clear throughout GI: Abd soft, non-tender, gravid appropriate for gestational age. Pos BS x 4 MS: Extremities nontender, no edema, normal ROM Neurologic: Alert and oriented x 4.  GU:  Cervix visually closed. No blood. Friable cervix. Moderate amount of clumpy yellow discharge adherent to vaginal walls & cervix.   NST:  Baseline: 140 bpm, Variability: Good {> 6 bpm), Accelerations: Reactive and Decelerations: Absent   Labs: Results for orders placed or performed during the hospital encounter of 01/02/18 (from the past 24 hour(s))  Urinalysis, Routine w reflex microscopic     Status: Abnormal   Collection Time: 01/02/18  7:00 PM  Result Value Ref Range   Color, Urine YELLOW YELLOW   APPearance HAZY (A) CLEAR   Specific Gravity, Urine 1.016 1.005 - 1.030   pH 6.0 5.0 - 8.0   Glucose, UA NEGATIVE NEGATIVE mg/dL   Hgb urine dipstick NEGATIVE NEGATIVE   Bilirubin Urine NEGATIVE  NEGATIVE   Ketones, ur NEGATIVE NEGATIVE mg/dL   Protein, ur NEGATIVE NEGATIVE mg/dL   Nitrite NEGATIVE NEGATIVE   Leukocytes, UA TRACE (A) NEGATIVE   RBC / HPF 0-5 0 - 5 RBC/hpf   WBC, UA 0-5 0 - 5 WBC/hpf   Bacteria, UA RARE (A) NONE SEEN   Squamous Epithelial / LPF 0-5 0 - 5   Amorphous Crystal PRESENT     Imaging:  No results found.  MAU Course: Orders Placed This Encounter  Procedures  . Urinalysis, Routine w reflex microscopic  . Discharge patient   Meds ordered this encounter  Medications  . terconazole (TERAZOL 3) 0.8 % vaginal cream    Sig: Place 1 applicator vaginally at bedtime.    Dispense:  20 g    Refill:  0    Order Specific Question:   Supervising Provider    Answer:   Sloan Leiter [[id number]]    MDM: Reactive NST Based on exam, bleeding from friable cervix.  Rh positive  Assessment: 1. Vaginal yeast infection   2. [redacted] weeks gestation of pregnancy     Plan: Discharge home in stable condition.  Rx terazol Bleeding precautions and fetal kick counts   Allergies as of 01/02/2018      Reactions   Penicillins    Rash      Medication List    TAKE these medications   albuterol 108 (90 Base) MCG/ACT inhaler Commonly known as:  PROVENTIL HFA;VENTOLIN HFA Inhale 2 puffs into the lungs every 6 (six) hours as needed.   carbamide peroxide 6.5 % OTIC solution Commonly known as:  DEBROX Place 5 drops into both ears 2 (two) times daily.   Doxylamine-Pyridoxine 10-10 MG Tbec Take 2 tablets by mouth at bedtime. If continued/worsening after 2 days, take 1 tab in the morning and 2 tabs at bedtime   prenatal multivitamin Tabs tablet Take 1 tablet by mouth daily at 12 noon.   PROBIOTIC & ACIDOPHILUS EX ST Caps Take 1 capsule by mouth 3 (three) times daily.   terconazole 0.8 % vaginal cream Commonly known as:  TERAZOL 3 Place 1 applicator vaginally at bedtime.       Jorje Guild, NP 01/02/2018 8:10 PM

## 2018-01-02 NOTE — Discharge Instructions (Signed)
Vaginal Bleeding During Pregnancy, Third Trimester A small amount of bleeding (spotting) from the vagina is relatively common in pregnancy. Various things can cause bleeding or spotting in pregnancy. Sometimes the bleeding is normal and is not a problem. However, bleeding during the third trimester can also be a sign of something serious for the mother and the baby. Be sure to tell your health care provider about any vaginal bleeding right away. Some possible causes of vaginal bleeding during the third trimester include:  The placenta may be partially or completely covering the opening to the cervix (placenta previa).  The placenta may have separated from the uterus (abruption of the placenta).  There may be an infection or growth on the cervix.  You may be starting labor, called discharging of the mucus plug.  The placenta may grow into the muscle layer of the uterus (placenta accreta).  Follow these instructions at home: Watch your condition for any changes. The following actions may help to lessen any discomfort you are feeling:  Follow your health care provider's instructions for limiting your activity. If your health care provider orders bed rest, you may need to stay in bed and only get up to use the bathroom. However, your health care provider may allow you to continue light activity.  If needed, make plans for someone to help with your regular activities and responsibilities while you are on bed rest.  Keep track of the number of pads you use each day, how often you change pads, and how soaked (saturated) they are. Write this down.  Do not use tampons. Do not douche.  Do not have sexual intercourse or orgasms until approved by your health care provider.  Follow your health care provider's advice about lifting, driving, and physical activities.  If you pass any tissue from your vagina, save the tissue so you can show it to your health care provider.  Only take over-the-counter  or prescription medicines as directed by your health care provider.  Do not take aspirin because it can make you bleed.  Keep all follow-up appointments as directed by your health care provider.  Contact a health care provider if:  You have any vaginal bleeding during any part of your pregnancy.  You have cramps or labor pains.  You have a fever, not controlled by medicine. Get help right away if:  You have severe cramps or pain in your back or belly (abdomen).  You have chills.  You have a gush of fluid from the vagina.  You pass large clots or tissue from your vagina.  Your bleeding increases.  You feel light-headed or weak.  You pass out.  You feel less movement or no movement of the baby. This information is not intended to replace advice given to you by your health care provider. Make sure you discuss any questions you have with your health care provider. Document Released: 05/08/2002 Document Revised: 07/24/2015 Document Reviewed: 10/23/2012 Elsevier Interactive Patient Education  2018 [employer]. Vaginal Yeast infection, Adult Vaginal yeast infection is a condition that causes soreness, swelling, and redness (inflammation) of the vagina. It also causes vaginal discharge. This is a common condition. Some women get this infection frequently. What are the causes? This condition is caused by a change in the normal balance of the yeast (candida) and bacteria that live in the vagina. This change causes an overgrowth of yeast, which causes the inflammation. What increases the risk? This condition is more likely to develop in:  Women who take  antibiotic medicines.  Women who have diabetes.  Women who take birth control pills.  Women who are pregnant.  Women who douche often.  Women who have a weak defense (immune) system.  Women who have been taking steroid medicines for a long time.  Women who frequently wear tight clothing.  What are the signs or  symptoms? Symptoms of this condition include:  White, thick vaginal discharge.  Swelling, itching, redness, and irritation of the vagina. The lips of the vagina (vulva) may be affected as well.  Pain or a burning feeling while urinating.  Pain during sex.  How is this diagnosed? This condition is diagnosed with a medical history and physical exam. This will include a pelvic exam. Your health care provider will examine a sample of your vaginal discharge under a microscope. Your health care provider may send this sample for testing to confirm the diagnosis. How is this treated? This condition is treated with medicine. Medicines may be over-the-counter or prescription. You may be told to use one or more of the following:  Medicine that is taken orally.  Medicine that is applied as a cream.  Medicine that is inserted directly into the vagina (suppository).  Follow these instructions at home:  Take or apply over-the-counter and prescription medicines only as told by your health care provider.  Do not have sex until your health care provider has approved. Tell your sex partner that you have a yeast infection. That person should go to his or her health care provider if he or she develops symptoms.  Do not wear tight clothes, such as pantyhose or tight pants.  Avoid using tampons until your health care provider approves.  Eat more yogurt. This may help to keep your yeast infection from returning.  Try taking a sitz bath to help with discomfort. This is a warm water bath that is taken while you are sitting down. The water should only come up to your hips and should cover your buttocks. Do this 3-4 times per day or as told by your health care provider.  Do not douche.  Wear breathable, cotton underwear.  If you have diabetes, keep your blood sugar levels under control. Contact a health care provider if:  You have a fever.  Your symptoms go away and then return.  Your symptoms do  not get better with treatment.  Your symptoms get worse.  You have new symptoms.  You develop blisters in or around your vagina.  You have blood coming from your vagina and it is not your menstrual period.  You develop pain in your abdomen. This information is not intended to replace advice given to you by your health care provider. Make sure you discuss any questions you have with your health care provider. Document Released: 11/25/2004 Document Revised: 07/30/2015 Document Reviewed: 08/19/2014 Elsevier Interactive Patient Education  2018 [employer].

## 2018-01-02 NOTE — MAU Note (Signed)
Was at work.  Went to the bathroom, when she wiped, there was blood.  Has been having lower abd and back pain, and pressure today. There was blood in the toilet. Denies any placental issues on [place].

## 2018-01-05 ENCOUNTER — Other Ambulatory Visit: Payer: Self-pay

## 2018-01-05 ENCOUNTER — Ambulatory Visit (INDEPENDENT_AMBULATORY_CARE_PROVIDER_SITE_OTHER): Payer: Medicaid Other | Admitting: Family Medicine

## 2018-01-05 VITALS — BP 118/78 | HR 85 | Temp 98.6°F | Wt 203.0 lb

## 2018-01-05 DIAGNOSIS — R8271 Bacteriuria: Secondary | ICD-10-CM | POA: Diagnosis not present

## 2018-01-05 DIAGNOSIS — Z23 Encounter for immunization: Secondary | ICD-10-CM

## 2018-01-05 DIAGNOSIS — O9989 Other specified diseases and conditions complicating pregnancy, childbirth and the puerperium: Secondary | ICD-10-CM | POA: Diagnosis not present

## 2018-01-05 DIAGNOSIS — R8279 Other abnormal findings on microbiological examination of urine: Secondary | ICD-10-CM

## 2018-01-05 DIAGNOSIS — Z3403 Encounter for supervision of normal first pregnancy, third trimester: Secondary | ICD-10-CM

## 2018-01-05 LAB — OB RESULTS CONSOLE GBS: GBS: POSITIVE

## 2018-01-05 NOTE — Progress Notes (Signed)
Jody Mooney is a 22 y.o. G1P0 at [redacted]w[redacted]d here for routine follow up.  She reports positive fetal movement, no leakage of fluid or blood. See flow sheet for details.  A/P: Pregnancy at [redacted]w[redacted]d.  Doing well.   Pregnancy issues include yeast infection during pregnancy, urine culture (9/5) with lactobacillus species  Recently seen and MAU on 11/4 for abdominal pain and vaginal bleeding.  At that time was diagnosed with vaginal yeast infection.  Had reactive NST at that time.  Was discharged home with prescription for Terazol and return precautions.  Infant feeding choice: Breast Contraception choice: Undecided, considering IUD. Infant circumcision desired does not want to know sex of infant  Tdap was given today. 1 hour glucola reviewed: performed on 12/05/17 - 103 [place] 10/18/17: @[redacted]w[redacted]d , CWD, variable presentation, anterior placental lie, A small septated right ovarian cyst is seen, Technically difficult due to maternal habitus and fetal position, normal anatomy, 264g, 41% EFW CBC, RPR, and HIV were done today.   Pregnancy medical home and PHQ-9 forms were done today and reviewed.   Rh status was reviewed and patient does not need Rhogam.  Rhogam was not given today.   Patient with positive urine culture for lactobacillus species on 9/5. Per notes from Dr. Gwendlyn Deutscher "A repeated isolation of Lactobacillus with high colony counts and without other organisms in consecutive urine cultures may not represent simple contamination and treatment should be reserved for patients in whom the organism grows as a single isolate on consecutive cultures". Urine sample given today for repeat testing.    Childbirth and education classes were offered. Information placed in AVS as well.  Preterm labor and fetal movement precautions reviewed. Follow up 2 weeks in [facility] clinic

## 2018-01-05 NOTE — Assessment & Plan Note (Signed)
Repeat Urine cx obtained today per previous notes by Dr. Gwendlyn Deutscher

## 2018-01-05 NOTE — Patient Instructions (Addendum)
Third Trimester of Pregnancy The third trimester is from week 29 through week 42, months 7 through 9. This trimester is when your unborn baby (fetus) is growing very fast. At the end of the ninth month, the unborn baby is about 20 inches in length. It weighs about 6-10 pounds. Follow these instructions at home:  Avoid all smoking, herbs, and alcohol. Avoid drugs not approved by your doctor.  Do not use any tobacco products, including cigarettes, chewing tobacco, and electronic cigarettes. If you need help quitting, ask your doctor. You may get counseling or other support to help you quit.  Only take medicine as told by your doctor. Some medicines are safe and some are not during pregnancy.  Exercise only as told by your doctor. Stop exercising if you start having cramps.  Eat regular, healthy meals.  Wear a good support bra if your breasts are tender.  Do not use hot tubs, steam rooms, or saunas.  Wear your seat belt when driving.  Avoid raw meat, uncooked cheese, and liter boxes and soil used by cats.  Take your prenatal vitamins.  Take 1500-2000 milligrams of calcium daily starting at the 20th week of pregnancy until you deliver your baby.  Try taking medicine that helps you poop (stool softener) as needed, and if your doctor approves. Eat more fiber by eating fresh fruit, vegetables, and whole grains. Drink enough fluids to keep your pee (urine) clear or pale yellow.  Take warm water baths (sitz baths) to soothe pain or discomfort caused by hemorrhoids. Use hemorrhoid cream if your doctor approves.  If you have puffy, bulging veins (varicose veins), wear support hose. Raise (elevate) your feet for 15 minutes, 3-4 times a day. Limit salt in your diet.  Avoid heavy lifting, wear low heels, and sit up straight.  Rest with your legs raised if you have leg cramps or low back pain.  Visit your dentist if you have not gone during your pregnancy. Use a soft toothbrush to brush your  teeth. Be gentle when you floss.  You can have sex (intercourse) unless your doctor tells you not to.  Do not travel far distances unless you must. Only do so with your doctor's approval.  Take prenatal classes.  Practice driving to the hospital.  Pack your hospital bag.  Prepare the baby's room.  Go to your doctor visits. Get help if:  You are not sure if you are in labor or if your water has broken.  You are dizzy.  You have mild cramps or pressure in your lower belly (abdominal).  You have a nagging pain in your belly area.  You continue to feel sick to your stomach (nauseous), throw up (vomit), or have watery poop (diarrhea).  You have bad smelling fluid coming from your vagina.  You have pain with peeing (urination). Get help right away if:  You have a fever.  You are leaking fluid from your vagina.  You are spotting or bleeding from your vagina.  You have severe belly cramping or pain.  You lose or gain weight rapidly.  You have trouble catching your breath and have chest pain.  You notice sudden or extreme puffiness (swelling) of your face, hands, ankles, feet, or legs.  You have not felt the baby move in over an hour.  You have severe headaches that do not go away with medicine.  You have vision changes. This information is not intended to replace advice given to you by your health care provider. Make   sure you discuss any questions you have with your health care provider. Document Released: 05/12/2009 Document Revised: 07/24/2015 Document Reviewed: 04/18/2012 Elsevier Interactive Patient Education  2017 [place] (Call [phone]) or visit [email or web address]     Contraception Choices Contraception, also called birth control, means things to use or ways to try not to get pregnant. Hormonal birth control This kind of birth control uses hormones. Here are some types of hormonal birth control:  A tube that  is put under skin of the arm (implant). The tube can stay in for as long as 3 years.  Shots to get every 3 months (injections).  Pills to take every day (birth control pills).  A patch to change 1 time each week for 3 weeks (birth control patch). After that, the patch is taken off for 1 week.  A ring to put in the vagina. The ring is left in for 3 weeks. Then it is taken out of the vagina for 1 week. Then a new ring is put in.  Pills to take after unprotected sex (emergency birth control pills).  Barrier birth control Here are some types of barrier birth control:  A thin covering that is put on the penis before sex (female condom). The covering is thrown away after sex.  A soft, loose covering that is put in the vagina before sex (female condom). The covering is thrown away after sex.  A rubber bowl that sits over the cervix (diaphragm). The bowl must be made for you. The bowl is put into the vagina before sex. The bowl is left in for 6-8 hours after sex. It is taken out within 24 hours.  A small, soft cup that fits over the cervix (cervical cap). The cup must be made for you. The cup can be left in for 6-8 hours after sex. It is taken out within 48 hours.  A sponge that is put into the vagina before sex. It must be left in for at least 6 hours after sex. It must be taken out within 30 hours. Then it is thrown away.  A chemical that kills or stops sperm from getting into the uterus (spermicide). It may be a pill, cream, jelly, or foam to put in the vagina. The chemical should be used at least 10-15 minutes before sex.  IUD (intrauterine) birth control An IUD is a small, T-shaped piece of plastic. It is put inside the uterus. There are two kinds:  Hormone IUD. This kind can stay in for 3-5 years.  Copper IUD. This kind can stay in for 10 years.  Permanent birth control Here are some types of permanent birth control:  Surgery to block the fallopian tubes.  Having an insert put into  each fallopian tube.  Surgery to tie off the tubes that carry sperm (vasectomy).  Natural planning birth control Here are some types of natural planning birth control:  Not having sex on the days the woman could get pregnant.  Using a calendar: ? To keep track of the length of each period. ? To find out what days pregnancy can happen. ? To plan to not have sex on days when pregnancy can happen.  Watching for symptoms of ovulation and not having sex during ovulation. One way the woman can check for ovulation is to check her temperature.  Waiting to have sex until after ovulation.  Summary  Contraception, also called birth control, means things to use or ways to try not to get  pregnant.  Hormonal methods of birth control include implants, injections, pills, patches, vaginal rings, and emergency birth control pills.  Barrier methods of birth control can include female condoms, female condoms, diaphragms, cervical caps, sponges, and spermicides.  There are two types of IUD (intrauterine device) birth control. An IUD can be put in a woman's uterus to prevent pregnancy for 3-5 years.  Permanent sterilization can be done through a procedure for males, females, or both.  Natural planning methods involve not having sex on the days when the woman could get pregnant. This information is not intended to replace advice given to you by your health care provider. Make sure you discuss any questions you have with your health care provider. Document Released: 12/13/2008 Document Revised: 02/26/2016 Document Reviewed: 02/26/2016 Elsevier Interactive Patient Education  2017 [employer].

## 2018-01-06 ENCOUNTER — Telehealth: Payer: Self-pay

## 2018-01-06 LAB — CBC
Hematocrit: 33.2 % — ABNORMAL LOW (ref 34.0–46.6)
Hemoglobin: 11.6 g/dL (ref 11.1–15.9)
MCH: 30.9 pg (ref 26.6–33.0)
MCHC: 34.9 g/dL (ref 31.5–35.7)
MCV: 88 fL (ref 79–97)
Platelets: 200 10*3/uL (ref 150–450)
RBC: 3.76 x10E6/uL — AB (ref 3.77–5.28)
RDW: 11.8 % — AB (ref 12.3–15.4)
WBC: 8.9 10*3/uL (ref 3.4–10.8)

## 2018-01-06 LAB — HIV ANTIBODY (ROUTINE TESTING W REFLEX): HIV SCREEN 4TH GENERATION: NONREACTIVE

## 2018-01-06 LAB — RPR: RPR: NONREACTIVE

## 2018-01-06 NOTE — Telephone Encounter (Signed)
Called and informed patient of negative test results.  Jody Mooney, [place]

## 2018-01-09 LAB — CULTURE, OB URINE

## 2018-01-09 LAB — URINE CULTURE, OB REFLEX

## 2018-01-12 ENCOUNTER — Ambulatory Visit (INDEPENDENT_AMBULATORY_CARE_PROVIDER_SITE_OTHER): Payer: Medicaid Other | Admitting: Family Medicine

## 2018-01-12 ENCOUNTER — Other Ambulatory Visit: Payer: Self-pay

## 2018-01-12 VITALS — BP 122/62 | HR 94 | Temp 98.4°F | Wt 206.0 lb

## 2018-01-12 DIAGNOSIS — Z3403 Encounter for supervision of normal first pregnancy, third trimester: Secondary | ICD-10-CM

## 2018-01-12 NOTE — Progress Notes (Signed)
Jody Mooney is a 22 y.o. G1P0 at [redacted]w[redacted]d here for routine follow up in [facility].  She reports no concerns with her pregnancy. She reports no vaginal bleeding or LOF. Baby moving well. She also reports feeling occasional sharp braxton-hicks contractions in the evenings of the past week but no regularly occurring ctx. She reports sex of baby is female. Treated recently for yeast infection, symptoms resolved.  See flow sheet for details.  A/P: Pregnancy at [redacted]w[redacted]d.  Doing well.   Pregnancy issues include: 1. GBS bacteriuria: noted on most recent urine culture, but low colony count of only 1000 colonies/mL. Will message Dr. Kennon Rounds to confirm this means patient will require intrapartum antibiotic prophylaxis. Isolate was sensitive to cefepime, cefotaxime, and ceftriaxone but resistant to clindamycin  2. Incorrect dating - had ultrasound on 08/08/17 which dated pregnancy at [redacted]w[redacted]d via [place]. This was entered incorrectly as "[redacted]w[redacted]d", resulting in incorrect EDC. Dates adjusted with correct dating - EDC now 03/11/18. Patient informed.  3. R adnexal mass - seen on initial ultrasound, smaller by the time of anatomy scan. Will also discuss this with Dr. Kennon Rounds on whether postpartum imaging is necessary.  4. Routine pregnancy care: -Infant feeding choice: Breastmilk -Contraception choice: Undecided - considering IUD -Has already received Tdap -Preterm labor and fetal movement precautions reviewed. -Follow up 2 weeks.  Berlinda Last, MS3  Patient seen along with MS3 student Berlinda Last. I personally evaluated this patient along with the student, and verified all aspects of the history, physical exam, and medical decision making as documented by the student. I agree with the student's documentation and have made all necessary edits.   Chrisandra Netters, MD [place]

## 2018-01-12 NOTE — Patient Instructions (Signed)

## 2018-01-23 ENCOUNTER — Inpatient Hospital Stay (HOSPITAL_COMMUNITY)
Admission: AD | Admit: 2018-01-23 | Discharge: 2018-01-23 | Discharge status: Against Medical Advice | Payer: Medicaid Other | Source: Ambulatory Visit | Attending: Obstetrics and Gynecology | Admitting: Obstetrics and Gynecology

## 2018-01-23 ENCOUNTER — Encounter (HOSPITAL_COMMUNITY): Payer: Self-pay

## 2018-01-23 DIAGNOSIS — Z5329 Procedure and treatment not carried out because of patient's decision for other reasons: Secondary | ICD-10-CM | POA: Insufficient documentation

## 2018-01-23 LAB — URINALYSIS, ROUTINE W REFLEX MICROSCOPIC
Bilirubin Urine: NEGATIVE
GLUCOSE, UA: NEGATIVE mg/dL
HGB URINE DIPSTICK: NEGATIVE
KETONES UR: 20 mg/dL — AB
NITRITE: NEGATIVE
PH: 6 (ref 5.0–8.0)
PROTEIN: NEGATIVE mg/dL
Specific Gravity, Urine: 1.016 (ref 1.005–1.030)

## 2018-01-23 NOTE — MAU Note (Signed)
Pt calls RN into MAU room 3 and states she does not wish to wait any longer and feels like nothing is really going on and that she would like to be released. Pt signs AMA form and leaves unit. [place] notified.

## 2018-01-23 NOTE — MAU Note (Signed)
Pt here with complaints of contractions that started yesterday. States they are irregular and has not timed them. States she works in [employer] and they were worse at work. Pt denies LOF or vaginal. Reports good fetal movement.

## 2018-01-25 ENCOUNTER — Inpatient Hospital Stay (HOSPITAL_COMMUNITY)
Admission: AD | Admit: 2018-01-25 | Discharge: 2018-01-25 | Disposition: A | Discharge status: Home / Self Care | Payer: Medicaid Other | Source: Ambulatory Visit | Attending: Obstetrics and Gynecology | Admitting: Obstetrics and Gynecology

## 2018-01-25 ENCOUNTER — Encounter: Payer: Self-pay | Admitting: Family Medicine

## 2018-01-25 ENCOUNTER — Encounter (HOSPITAL_COMMUNITY): Payer: Self-pay

## 2018-01-25 ENCOUNTER — Other Ambulatory Visit: Payer: Self-pay

## 2018-01-25 ENCOUNTER — Ambulatory Visit (INDEPENDENT_AMBULATORY_CARE_PROVIDER_SITE_OTHER): Payer: Medicaid Other | Admitting: Family Medicine

## 2018-01-25 VITALS — BP 118/66 | HR 79 | Temp 98.4°F | Wt 202.2 lb

## 2018-01-25 DIAGNOSIS — J45909 Unspecified asthma, uncomplicated: Secondary | ICD-10-CM | POA: Diagnosis not present

## 2018-01-25 DIAGNOSIS — R8271 Bacteriuria: Secondary | ICD-10-CM | POA: Insufficient documentation

## 2018-01-25 DIAGNOSIS — B373 Candidiasis of vulva and vagina: Secondary | ICD-10-CM | POA: Diagnosis not present

## 2018-01-25 DIAGNOSIS — O98819 Other maternal infectious and parasitic diseases complicating pregnancy, unspecified trimester: Secondary | ICD-10-CM | POA: Diagnosis not present

## 2018-01-25 DIAGNOSIS — Z880 Allergy status to penicillin: Secondary | ICD-10-CM | POA: Insufficient documentation

## 2018-01-25 DIAGNOSIS — O99513 Diseases of the respiratory system complicating pregnancy, third trimester: Secondary | ICD-10-CM | POA: Diagnosis not present

## 2018-01-25 DIAGNOSIS — Z79899 Other long term (current) drug therapy: Secondary | ICD-10-CM | POA: Diagnosis not present

## 2018-01-25 DIAGNOSIS — B3731 Acute candidiasis of vulva and vagina: Secondary | ICD-10-CM

## 2018-01-25 DIAGNOSIS — Z331 Pregnant state, incidental: Secondary | ICD-10-CM

## 2018-01-25 DIAGNOSIS — O98813 Other maternal infectious and parasitic diseases complicating pregnancy, third trimester: Secondary | ICD-10-CM | POA: Insufficient documentation

## 2018-01-25 DIAGNOSIS — O418X30 Other specified disorders of amniotic fluid and membranes, third trimester, not applicable or unspecified: Secondary | ICD-10-CM | POA: Insufficient documentation

## 2018-01-25 DIAGNOSIS — Z0371 Encounter for suspected problem with amniotic cavity and membrane ruled out: Secondary | ICD-10-CM | POA: Diagnosis not present

## 2018-01-25 DIAGNOSIS — Z3A33 33 weeks gestation of pregnancy: Secondary | ICD-10-CM | POA: Diagnosis not present

## 2018-01-25 DIAGNOSIS — Z3403 Encounter for supervision of normal first pregnancy, third trimester: Secondary | ICD-10-CM

## 2018-01-25 DIAGNOSIS — B379 Candidiasis, unspecified: Secondary | ICD-10-CM

## 2018-01-25 LAB — POCT WET PREP (WET MOUNT)
Clue Cells Wet Prep Whiff POC: NEGATIVE
TRICHOMONAS WET PREP HPF POC: ABSENT

## 2018-01-25 LAB — AMNISURE RUPTURE OF MEMBRANE (ROM) NOT AT ARMC: AMNISURE: NEGATIVE

## 2018-01-25 MED ORDER — TERCONAZOLE 0.4 % VA CREA: 1.0000 | TOPICAL_CREAM | Freq: Every day | VAGINAL | 0 refills | Status: DC

## 2018-01-25 NOTE — Progress Notes (Signed)
  Jody Mooney is a 22 y.o. G1P0 at [redacted]w[redacted]d here for routine follow up.  She reports sensation of leaking fluid, it is clear and does not smell like urine for the past 4 days. She also thinks she has a yeast infection. She has occasional contractions, + fetal movement, no bleeding See flow sheet for details.  A/P: Pregnancy at [redacted]w[redacted]d.  Doing well.   Pregnancy issues include +GBS in urine, R adnexal mass seen on [place]   Infant feeding choice: breast Contraception choice: unsure, possible IUD Infant circumcision desired: unsure, patient does not wish to know the sex of infant  Tdap given 01/05/18  Leaking of fluids- unable to do fern as patient has a lot of yeast present. Cervix is closed on exam. There did appear to be pooling when I inserted the sterile speculum. Will send patient to MAU to r/o ruptured membranes. Discussed with Dr. Gwendlyn Deutscher and Dr. Ardelia Mems.   Lucila Maine, DO PGY-3, [facility] 01/25/2018 11:46 AM

## 2018-01-25 NOTE — Patient Instructions (Signed)
  Please go to the MAU to ensure you did not rupture your membranes.  You will need treatment for yeast infection as well.  If everything is intact, your next follow up with [place] is in 2 weeks.   If you have questions or concerns please do not hesitate to call at [phone].  Lucila Maine, DO PGY-3, [facility] 01/25/2018 11:24 AM

## 2018-01-25 NOTE — MAU Note (Signed)
Pt was at the office today, yeast seen. Has been leaking fluid for 4 days and sent to rule out ROM

## 2018-01-25 NOTE — MAU Note (Signed)
Urine in lab 

## 2018-01-25 NOTE — MAU Provider Note (Signed)
  History     CSN: [id number]  Arrival date and time: 01/25/18 1206   First Provider Initiated Contact with Patient 01/25/18 1241      Chief Complaint  Patient presents with  . Rupture of Membranes   HPI This is a 22yo G1P0 who is [redacted]w[redacted]d presenting from the office for possible ROM. Patient reports that one week ago she noticed leakage of a thin, clear, "slippery" fluid. She states that she has had multiple yeast infections during her pregnancy and thought that this fluid was normal vaginal discharge seeing as though she was recently successfully treated for a yeast infection. She denies any vaginal bleeding. She reports positive fetal movement. She had a routine office follow up today and speculum exam revealed lots of yeast, pooling of fluid, and a closed cervix. She was sent to the MAU to rule out ROM.    Past Medical History:  Diagnosis Date  . Asthma     Past Surgical History:  Procedure Laterality Date  . WISDOM TOOTH EXTRACTION      Family History  Problem Relation Age of Onset  . Hyperlipidemia Mother   . Anxiety disorder Unknown        Grandmother and mother  . Depression Unknown        Grandmother and mother  . Hyperlipidemia Unknown   . Diabetes type II Unknown        Grand parents  . Other Unknown        VSD repaired in grandmother age 59    Social History   Tobacco Use  . Smoking status: Never Smoker  . Smokeless tobacco: Never Used  Substance Use Topics  . Alcohol use: No  . Drug use: Never    Allergies:  Allergies  Allergen Reactions  . Penicillins     Rash    Medications Prior to Admission  Medication Sig Dispense Refill Last Dose  . Prenatal Vit-Fe Fumarate-FA (PRENATAL MULTIVITAMIN) TABS tablet Take 1 tablet by mouth daily at 12 noon. 60 tablet 3 More than a month at Unknown time    Review of Systems  Gastrointestinal: Negative for abdominal pain, nausea and vomiting.  Genitourinary: Positive for vaginal discharge (Clear, thin).  Negative for difficulty urinating, dysuria, pelvic pain and vaginal bleeding.       Vaginal itching   Physical Exam   Blood pressure 120/71, pulse (!) 105, temperature 98.2 F (36.8 C), temperature source Oral, resp. rate 16, weight 91.6 kg, last menstrual period 06/09/2017.  Physical Exam  Vitals reviewed. Constitutional: She is oriented to person, place, and time. She appears well-developed and well-nourished.  HENT:  Head: Normocephalic and atraumatic.  Cardiovascular: Normal rate and regular rhythm.  Respiratory: Effort normal and breath sounds normal.  GI: Soft. Bowel sounds are normal. There is no tenderness.  Neurological: She is alert and oriented to person, place, and time.  Skin: Skin is warm and dry.  Psychiatric: She has a normal mood and affect. Her behavior is normal. Judgment and thought content normal.    MAU Course  Procedures  MDM Wet prep revealed moderate amount of yeast.  Amnisure negative.  Ctx mild and irregular. Patient feels okay to go home.   Assessment and Plan  1. Vaginal Yeast Infection - Discharge home with prescription for Terazol vaginal cream   - Follow up in clinic as instructed or return if sx's worsen  Heena [place], PA-S.  01/25/2018, 12:53 PM

## 2018-01-25 NOTE — Discharge Instructions (Signed)

## 2018-01-25 NOTE — MAU Provider Note (Signed)
History     CSN: [id number]  Arrival date and time: 01/25/18 1206   First Provider Initiated Contact with Patient 01/25/18 1241      Chief Complaint  Patient presents with  . Rupture of Membranes   HPI Jody Mooney is a 22 y.o. G1P0 at [redacted]w[redacted]d who presents from the office for evaluation of possible rupture of membranes. She reports leaking of fluid for 4 days. In the office, she had a speculum exam with no pooling but copious yeast. Cervix was closed on exam. She denies any pain, vaginal bleeding. Reports normal fetal movement.   OB History    Gravida  1   Para      Term      Preterm      AB      Living        SAB      TAB      Ectopic      Multiple      Live Births              Past Medical History:  Diagnosis Date  . Asthma     Past Surgical History:  Procedure Laterality Date  . WISDOM TOOTH EXTRACTION      Family History  Problem Relation Age of Onset  . Hyperlipidemia Mother   . Anxiety disorder Unknown        Grandmother and mother  . Depression Unknown        Grandmother and mother  . Hyperlipidemia Unknown   . Diabetes type II Unknown        Grand parents  . Other Unknown        VSD repaired in grandmother age 26    Social History   Tobacco Use  . Smoking status: Never Smoker  . Smokeless tobacco: Never Used  Substance Use Topics  . Alcohol use: No  . Drug use: Never    Allergies:  Allergies  Allergen Reactions  . Penicillins     Rash    Medications Prior to Admission  Medication Sig Dispense Refill Last Dose  . Prenatal Vit-Fe Fumarate-FA (PRENATAL MULTIVITAMIN) TABS tablet Take 1 tablet by mouth daily at 12 noon. 60 tablet 3 More than a month at Unknown time    Review of Systems  Constitutional: Negative.  Negative for fatigue and fever.  HENT: Negative.   Respiratory: Negative.  Negative for shortness of breath.   Cardiovascular: Negative.  Negative for chest pain.  Gastrointestinal: Negative.  Negative for  abdominal pain, constipation, diarrhea, nausea and vomiting.  Genitourinary: Positive for vaginal discharge. Negative for dysuria and vaginal bleeding.  Neurological: Negative.  Negative for dizziness and headaches.   Physical Exam   Blood pressure 120/71, pulse (!) 105, temperature 98.2 F (36.8 C), temperature source Oral, resp. rate 16, weight 91.6 kg, last menstrual period 06/09/2017.  Physical Exam  Nursing note and vitals reviewed. Constitutional: She is oriented to person, place, and time. She appears well-developed and well-nourished. No distress.  HENT:  Head: Normocephalic.  Eyes: Pupils are equal, round, and reactive to light.  Cardiovascular: Normal rate, regular rhythm and normal heart sounds.  Respiratory: Effort normal and breath sounds normal. No respiratory distress.  GI: Soft. Bowel sounds are normal. She exhibits no distension. There is no tenderness.  Neurological: She is alert and oriented to person, place, and time.  Skin: Skin is warm and dry.  Psychiatric: She has a normal mood and affect. Her behavior is normal. Judgment and  thought content normal.   Fetal Tracing:  Baseline: 130 Variability: moderate Accels: 15x15 Decels: none  Toco: none   MAU Course  Procedures Results for orders placed or performed during the hospital encounter of 01/25/18 (from the past 24 hour(s))  Amnisure rupture of membrane (rom)not at [facility]     Status: None   Collection Time: 01/25/18 12:31 PM  Result Value Ref Range   Amnisure ROM NEGATIVE     MDM Amnisure  Assessment and Plan   1. Encounter for suspected premature rupture of amniotic membranes, with rupture of membranes not found   2. Candidiasis of vagina during pregnancy   3. [redacted] weeks gestation of pregnancy    -Discharge home in stable condition -Rx for terazol sent to patient's pharmacy -Preterm labor precautions discussed -Patient advised to follow-up with Family Medicine as scheduled for prenatal  care -Patient may return to MAU as needed or if her condition were to change or worsen   Wende Mott CNM 01/25/2018, 12:41 PM

## 2018-02-02 ENCOUNTER — Inpatient Hospital Stay (HOSPITAL_COMMUNITY)
Admission: AD | Admit: 2018-02-02 | Discharge: 2018-02-02 | Disposition: A | Discharge status: Home / Self Care | Payer: Medicaid Other | Source: Ambulatory Visit | Attending: Obstetrics and Gynecology | Admitting: Obstetrics and Gynecology

## 2018-02-02 ENCOUNTER — Other Ambulatory Visit: Payer: Self-pay

## 2018-02-02 ENCOUNTER — Encounter (HOSPITAL_COMMUNITY): Payer: Self-pay | Admitting: *Deleted

## 2018-02-02 DIAGNOSIS — O26893 Other specified pregnancy related conditions, third trimester: Secondary | ICD-10-CM | POA: Insufficient documentation

## 2018-02-02 DIAGNOSIS — Z3A34 34 weeks gestation of pregnancy: Secondary | ICD-10-CM | POA: Diagnosis not present

## 2018-02-02 DIAGNOSIS — N898 Other specified noninflammatory disorders of vagina: Secondary | ICD-10-CM | POA: Diagnosis not present

## 2018-02-02 LAB — URINALYSIS, ROUTINE W REFLEX MICROSCOPIC
BILIRUBIN URINE: NEGATIVE
Glucose, UA: NEGATIVE mg/dL
HGB URINE DIPSTICK: NEGATIVE
Ketones, ur: NEGATIVE mg/dL
Nitrite: NEGATIVE
PH: 7 (ref 5.0–8.0)
Protein, ur: NEGATIVE mg/dL
SPECIFIC GRAVITY, URINE: 1.013 (ref 1.005–1.030)

## 2018-02-02 LAB — POCT FERN TEST: POCT FERN TEST: NEGATIVE

## 2018-02-02 LAB — WET PREP, GENITAL
Clue Cells Wet Prep HPF POC: NONE SEEN
SPERM: NONE SEEN
Trich, Wet Prep: NONE SEEN

## 2018-02-02 MED ORDER — CLOTRIMAZOLE 1 % VA CREA: 1.0000 | TOPICAL_CREAM | Freq: Every day | VAGINAL | 0 refills | Status: AC

## 2018-02-02 NOTE — MAU Note (Signed)
Presents with c/o LOF, pale yellow, since 0430 this morning.  Denies VB.  Reports +FM, but decreased.

## 2018-02-02 NOTE — MAU Provider Note (Signed)
History     CSN: [id number]  Arrival date and time: 02/02/18 1327   First Provider Initiated Contact with Patient 02/02/18 1402      Chief Complaint  Patient presents with  . Rupture of Membranes   Jody Mooney presents with concern for ROM She states that around 4am this morning she felt a gush of fluid that soaked her underwear Since then she's continued to have intermittent gushes of fluid Has not had enough fluid to run down legs Reports that she tried to smell the fluid to see if it was urine, but fluid was odorless Denies contractions, but states that she has bilateral flank pain  Reports recurrent yeast infections this pregnancy Also has had concern for leakage of fluid for the past 3 weeks However, every time she comes in, she's told that it's not amniotic fluid Has had recurrent yeast infections this pregnancy; all treated with 3 day vaginal cream Reports last intercourse about 1 week ago   Past Medical History:  Diagnosis Date  . Asthma     Past Surgical History:  Procedure Laterality Date  . WISDOM TOOTH EXTRACTION      Family History  Problem Relation Age of Onset  . Hyperlipidemia Mother   . Anxiety disorder Unknown        Grandmother and mother  . Depression Unknown        Grandmother and mother  . Hyperlipidemia Unknown   . Diabetes type II Unknown        Grand parents  . Other Unknown        VSD repaired in grandmother age 14    Social History   Tobacco Use  . Smoking status: Never Smoker  . Smokeless tobacco: Never Used  Substance Use Topics  . Alcohol use: No  . Drug use: Never    Allergies:  Allergies  Allergen Reactions  . Penicillins     Rash    Medications Prior to Admission  Medication Sig Dispense Refill Last Dose  . Prenatal Vit-Fe Fumarate-FA (PRENATAL MULTIVITAMIN) TABS tablet Take 1 tablet by mouth daily at 12 noon. 60 tablet 3 More than a month at Unknown time  . terconazole (TERAZOL 7) 0.4 % vaginal cream Place 1  applicator vaginally at bedtime. 45 g 0     Review of Systems  All other systems reviewed and are negative.  Physical Exam   Blood pressure 118/70, pulse 82, temperature 98.2 F (36.8 C), temperature source Oral, resp. rate 19, height 5\' 4"  (1.626 m), weight 93.6 kg, last menstrual period 06/09/2017, SpO2 99 %.  Physical Exam  Constitutional: She is oriented to person, place, and time. She appears well-developed and well-nourished. No distress.  HENT:  Head: Normocephalic and atraumatic.  Eyes: Pupils are equal, round, and reactive to light. Right eye exhibits no discharge. Left eye exhibits no discharge. No scleral icterus.  Cardiovascular: Normal rate and regular rhythm.  Respiratory: Effort normal. No respiratory distress.  Genitourinary:  Genitourinary Comments: Dilation: Closed Exam by:: Doren Custard  Neurological: She is alert and oriented to person, place, and time.  Skin: Skin is warm and dry. She is not diaphoretic.  Psychiatric: She has a normal mood and affect. Her behavior is normal. Judgment and thought content normal.    MAU Course  Procedures  MDM Jody Mooney negative x 2 No pooling on speculum exam   Assessment and Plan  Vaginal discharge during pregnancy in third trimester - unlikely ROM given negative fern and pooling - wet prep positive  for yeast - Rx for clotimazole x 7 days sent - return precautions discussed in detail - follow up PRN    [place] Fellow  02/02/2018, 2:24 PM

## 2018-02-08 NOTE — Progress Notes (Signed)
Jody Mooney is a 22 y.o. G1P0 at [redacted]w[redacted]d here for routine follow up.  Jody Mooney reports no leakage of fluid, no bleeding, no contraction. Can feel baby moving appropriatly. See flow sheet for details.  A/P: Pregnancy at [redacted]w[redacted]d.  Doing well.   Pregnancy issues include: Dermoids: will need f/u with GYN post partum, may need surgery Urine cx with GBS: will need intrapartum abx  1 hour glucola reviewed: performed on 12/05/17 - [address] [place] 10/18/17: @[redacted]w[redacted]d , CWD, variable presentation, anterior placental lie, A small septated right ovarian cyst is seen, Technically difficult due tomaternal habitus and fetal position, normal anatomy, 264g, 41% EFW Hgb 11.6, RPR NR, HIV NR (01/05/18)  Infant feeding choice: breast  Contraception choice: has not decided yet, thinking IUD but not definite  Infant circumcision desired: not applicable  Tdap was given 01/05/18 per chart review. GBS and gc/chlamydia testing was not performed today.  Preterm labor and fetal movement precautions reviewed. Safe sleep discussed. Follow up 1 week(s).

## 2018-02-10 ENCOUNTER — Ambulatory Visit (INDEPENDENT_AMBULATORY_CARE_PROVIDER_SITE_OTHER): Payer: Medicaid Other | Admitting: Family Medicine

## 2018-02-10 VITALS — BP 104/70 | HR 73 | Temp 98.3°F | Wt 214.4 lb

## 2018-02-10 DIAGNOSIS — Z3493 Encounter for supervision of normal pregnancy, unspecified, third trimester: Secondary | ICD-10-CM

## 2018-02-10 NOTE — Patient Instructions (Signed)

## 2018-02-16 ENCOUNTER — Encounter: Payer: Medicaid Other | Admitting: Family Medicine

## 2018-02-27 ENCOUNTER — Other Ambulatory Visit: Payer: Self-pay

## 2018-02-27 ENCOUNTER — Ambulatory Visit (INDEPENDENT_AMBULATORY_CARE_PROVIDER_SITE_OTHER): Payer: BLUE CROSS/BLUE SHIELD | Admitting: Family Medicine

## 2018-02-27 VITALS — BP 100/52 | HR 103 | Temp 97.6°F | Wt 220.4 lb

## 2018-02-27 DIAGNOSIS — Z3403 Encounter for supervision of normal first pregnancy, third trimester: Secondary | ICD-10-CM | POA: Diagnosis not present

## 2018-02-27 MED ORDER — MICONAZOLE NITRATE APPLICATOR 200 & 2 MG-% (9GM) VA KIT: 1.0000 | PACK | Freq: Every day | VAGINAL | 0 refills | Status: DC

## 2018-02-27 NOTE — Progress Notes (Signed)
  Jody Mooney is a 22 y.o. G1P0 at [redacted]w[redacted]d here for routine follow up.  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 See flow sheet for details.  A/P: Pregnancy at [redacted]w[redacted]d. Doing well.   Pregnancy issues include: Dermoids: will need f/u with GYN post partum, may need surgery Urine cx with GBS: will need intrapartum abx. Recurrent yeast infection: completed course of terconazole 7 days, yeast returned. Will switch to miconazole.   Infant feeding choice: breast Contraception choice:  Considering IUD Infant circumcision desired: not applicable  GBS and gc/chlamydia testing results were reviewed today. GBS +  Labor and fetal movement precautions reviewed. Follow up 1 week.   Lucila Maine, DO PGY-3, [facility] 02/27/2018 10:46 AM

## 2018-02-27 NOTE — Patient Instructions (Signed)
  Good to see you today Please proceed to MAU if you are having frequent contractions, if your water breaks, if you have any concerning pain or vaginal bleeding, or if you do not feel baby move  If you have questions or concerns please do not hesitate to call at [phone].  Lucila Maine, DO PGY-3, [facility] 02/27/2018 8:46 AM

## 2018-03-01 ENCOUNTER — Encounter (HOSPITAL_COMMUNITY): Payer: Self-pay | Admitting: Emergency Medicine

## 2018-03-01 ENCOUNTER — Inpatient Hospital Stay (HOSPITAL_COMMUNITY)
Admission: AD | Admit: 2018-03-01 | Discharge: 2018-03-01 | Disposition: A | Discharge status: Home / Self Care | Payer: BLUE CROSS/BLUE SHIELD | Source: Ambulatory Visit | Attending: Obstetrics and Gynecology | Admitting: Obstetrics and Gynecology

## 2018-03-01 DIAGNOSIS — Z880 Allergy status to penicillin: Secondary | ICD-10-CM | POA: Diagnosis not present

## 2018-03-01 DIAGNOSIS — Z0371 Encounter for suspected problem with amniotic cavity and membrane ruled out: Secondary | ICD-10-CM | POA: Diagnosis not present

## 2018-03-01 DIAGNOSIS — Z3A38 38 weeks gestation of pregnancy: Secondary | ICD-10-CM

## 2018-03-01 DIAGNOSIS — O26893 Other specified pregnancy related conditions, third trimester: Secondary | ICD-10-CM | POA: Diagnosis not present

## 2018-03-01 DIAGNOSIS — N898 Other specified noninflammatory disorders of vagina: Secondary | ICD-10-CM

## 2018-03-01 NOTE — MAU Provider Note (Signed)
Chief Complaint:  Rupture of Membranes   First Provider Initiated Contact with Patient 03/01/18 1726      HPI: Jody Mooney is a 23 y.o. G1P0 at 53w4dho presents to maternity admissions reporting leaking fluid off and on, small gushes.  . She reports good fetal movement, denies vaginal bleeding, vaginal itching/burning, urinary symptoms, h/a, dizziness, n/v, diarrhea, constipation or fever/chills.    RN Note: Pt has had some leaking fluid today and thought it may have been from a yeast infection but had a larger gush at 2:45 and again at 3:30. Been having some back pain. +FM, no bleeding.  Past Medical History: Past Medical History:  Diagnosis Date  . Asthma     Past obstetric history: OB History  Gravida Para Term Preterm AB Living  1            SAB TAB Ectopic Multiple Live Births               # Outcome Date GA Lbr Len/2nd Weight Sex Delivery Anes PTL Lv  1 Current             Past Surgical History: Past Surgical History:  Procedure Laterality Date  . WISDOM TOOTH EXTRACTION      Family History: Family History  Problem Relation Age of Onset  . Hyperlipidemia Mother   . Anxiety disorder Other        Grandmother and mother  . Depression Other        Grandmother and mother  . Hyperlipidemia Other   . Diabetes type II Other        [place]  . Other Other        VSD repaired in grandmother age 22   Social History: Social History   Tobacco Use  . Smoking status: Never Smoker  . Smokeless tobacco: Never Used  Substance Use Topics  . Alcohol use: No  . Drug use: Never    Allergies:  Allergies  Allergen Reactions  . Penicillins Rash    Has patient had a PCN reaction causing immediate rash, facial/tongue/throat swelling, SOB or lightheadedness with hypotension: Unknown Has patient had a PCN reaction causing severe rash involving mucus membranes or skin necrosis: Unknown Has patient had a PCN reaction that required hospitalization: Unknown Has  patient had a PCN reaction occurring within the last 10 years: no If all of the above answers are "NO", then may proceed with Cephalosporin use.     Meds:  Medications Prior to Admission  Medication Sig Dispense Refill Last Dose  . Miconazole Nitrate Applicator 2616& 2 MG-% (9GM) KIT Place 1 Applicatorful vaginally at bedtime for 7 days. 24 g 0   . Prenatal Vit-Fe Fumarate-FA (PRENATAL MULTIVITAMIN) TABS tablet Take 1 tablet by mouth daily at 12 noon. (Patient not taking: Reported on 02/02/2018) 60 tablet 3 Not Taking at Unknown time    I have reviewed patient's Past Medical Hx, Surgical Hx, Family Hx, Social Hx, medications and allergies.   ROS:  Review of Systems  Constitutional: Negative for chills and fever.  Respiratory: Negative for shortness of breath.   Gastrointestinal: Negative for abdominal pain, nausea and vomiting.  Genitourinary: Positive for vaginal discharge. Negative for vaginal bleeding.   Other systems negative  Physical Exam   Patient Vitals for the past 24 hrs:  BP Temp Temp src Pulse Resp SpO2 Weight  03/01/18 1656 137/80 98 F (36.7 C) Oral 82 18 100 % 98.9 kg   Constitutional: Well-developed, well-nourished female  in no acute distress.  Cardiovascular: normal rate and rhythm Respiratory: normal effort, clear to auscultation bilaterally GI: Abd soft, non-tender, gravid appropriate for gestational age.   No rebound or guarding. MS: Extremities nontender, no edema, normal ROM Neurologic: Alert and oriented x 4.  GU: Neg CVAT.  PELVIC EXAM: (spec exam)  Cervix pink, visually closed, without lesion, scant white milky discharge, vaginal walls and external genitalia normal  NEGATIVE FERNING   FHT:  Baseline 140 , moderate variability, accelerations present, no decelerations Contractions:  Irregular     Labs: No results found for this or any previous visit (from the past 24 hour(s)). No results found for this or any previous visit (from the past 24  hour(s)).   Imaging:  No results found.  MAU Course/MDM: NST reviewed, reactive.  Treatments in MAU included EFM  Discussed signs of labor and ROM. [name]    Assessment: Single intrauterine pregnancy at 11w4dVaginal discharge in pregnancy, no evidence of ruptured membranes  Plan: Discharge home Labor precautions and fetal kick counts Follow up in Office for prenatal visits and recheck of status  Encouraged to return here or to other Urgent Care/ED if she develops worsening of symptoms, increase in pain, fever, or other concerning symptoms.   Pt stable at time of discharge.  MHansel FeinsteinCNM, MSN Certified Nurse-Midwife 03/01/2018 5:26 PM

## 2018-03-01 NOTE — MAU Note (Signed)
Pt has had some leaking fluid today and thought it may have been from a yeast infection but had a larger gush at 2:45 and again at 3:30. Been having some back pain. +FM, no bleeding.

## 2018-03-01 NOTE — L&D Delivery Note (Addendum)
Delivery Note Patient is a 23 y.o. now G1P1001 s/p NSVD at [redacted]w[redacted]d, who was admitted for PROM. She progressed rapidly with Pitocin augmentation to complete and pushed 27min to deliver. At 11:38 AM a viable female was delivered via Vaginal, Spontaneous (Presentation: cephalic, LOA).  APGAR: 8, 9; weight 6 lb 9.6 oz (2994 g).  Cord clamping delayed by several minutes then clamped by SNM and cut by FOB.  Placenta and 3-vessel cord intact and spontaneous, bleeding minimal.  Cord pH: N/a. No laceration repair required. Mom and baby stable prior to transfer to postpartum. She plans on breastfeeding. She is considering an IUD for birth control.  Anesthesia: None Episiotomy: None Lacerations: None Suture Repair: None Est. Blood Loss (mL): 30  Mom to postpartum.  Baby to Couplet care / Skin to Skin.  Gabriel Carina, SNM 03/04/2018, 3:00 PM   OB FELLOW DELIVERY ATTESTATION  I was gloved and present for the delivery in its entirety, and I agree with the above SNM's note.    Phill Myron, D.O. OB Fellow  03/04/2018, 3:08 PM

## 2018-03-01 NOTE — Discharge Instructions (Signed)

## 2018-03-04 ENCOUNTER — Other Ambulatory Visit: Payer: Self-pay

## 2018-03-04 ENCOUNTER — Encounter (HOSPITAL_COMMUNITY): Payer: Self-pay | Admitting: Anesthesiology

## 2018-03-04 ENCOUNTER — Inpatient Hospital Stay (HOSPITAL_COMMUNITY)
Admission: AD | Admit: 2018-03-04 | Discharge: 2018-03-06 | DRG: 807 | Disposition: A | Discharge status: Home / Self Care | Payer: BLUE CROSS/BLUE SHIELD | Attending: Obstetrics & Gynecology | Admitting: Obstetrics & Gynecology

## 2018-03-04 ENCOUNTER — Encounter (HOSPITAL_COMMUNITY): Payer: Self-pay

## 2018-03-04 DIAGNOSIS — O4202 Full-term premature rupture of membranes, onset of labor within 24 hours of rupture: Secondary | ICD-10-CM | POA: Diagnosis not present

## 2018-03-04 DIAGNOSIS — O9952 Diseases of the respiratory system complicating childbirth: Secondary | ICD-10-CM | POA: Diagnosis present

## 2018-03-04 DIAGNOSIS — J4520 Mild intermittent asthma, uncomplicated: Secondary | ICD-10-CM | POA: Diagnosis present

## 2018-03-04 DIAGNOSIS — Z880 Allergy status to penicillin: Secondary | ICD-10-CM

## 2018-03-04 DIAGNOSIS — O99824 Streptococcus B carrier state complicating childbirth: Secondary | ICD-10-CM | POA: Diagnosis present

## 2018-03-04 DIAGNOSIS — O4292 Full-term premature rupture of membranes, unspecified as to length of time between rupture and onset of labor: Secondary | ICD-10-CM | POA: Diagnosis not present

## 2018-03-04 DIAGNOSIS — Z3A39 39 weeks gestation of pregnancy: Secondary | ICD-10-CM | POA: Diagnosis not present

## 2018-03-04 LAB — CBC
HCT: 37.4 % (ref 36.0–46.0)
HEMOGLOBIN: 12.8 g/dL (ref 12.0–15.0)
MCH: 30.4 pg (ref 26.0–34.0)
MCHC: 34.2 g/dL (ref 30.0–36.0)
MCV: 88.8 fL (ref 80.0–100.0)
PLATELETS: 210 10*3/uL (ref 150–400)
RBC: 4.21 MIL/uL (ref 3.87–5.11)
RDW: 13.3 % (ref 11.5–15.5)
WBC: 11.3 10*3/uL — AB (ref 4.0–10.5)
nRBC: 0 % (ref 0.0–0.2)

## 2018-03-04 LAB — TYPE AND SCREEN
ABO/RH(D): O POS
Antibody Screen: NEGATIVE

## 2018-03-04 LAB — RPR: RPR Ser Ql: NONREACTIVE

## 2018-03-04 LAB — ABO/RH: ABO/RH(D): O POS

## 2018-03-04 MED ORDER — OXYTOCIN 40 UNITS IN LACTATED RINGERS INFUSION - SIMPLE MED
2.5000 [IU]/h | INTRAVENOUS | Status: DC
  Administered 2018-03-04: 2.5 [IU]/h via INTRAVENOUS
  Filled 2018-03-04: qty 1000

## 2018-03-04 MED ORDER — LACTATED RINGERS IV SOLN
500.0000 mL | Freq: Once | INTRAVENOUS | Status: AC
  Administered 2018-03-04: 500 mL via INTRAVENOUS

## 2018-03-04 MED ORDER — LIDOCAINE HCL (PF) 1 % IJ SOLN
30.0000 mL | INTRAMUSCULAR | Status: DC | PRN
  Filled 2018-03-04: qty 30

## 2018-03-04 MED ORDER — ONDANSETRON HCL 4 MG/2ML IJ SOLN: 4.0000 mg | INTRAMUSCULAR | Status: DC | PRN

## 2018-03-04 MED ORDER — PHENYLEPHRINE 40 MCG/ML (10ML) SYRINGE FOR IV PUSH (FOR BLOOD PRESSURE SUPPORT)
PREFILLED_SYRINGE | INTRAVENOUS | Status: AC
  Filled 2018-03-04: qty 10

## 2018-03-04 MED ORDER — FENTANYL 2.5 MCG/ML BUPIVACAINE 1/10 % EPIDURAL INFUSION (WH - ANES)
INTRAMUSCULAR | Status: AC
  Filled 2018-03-04: qty 100

## 2018-03-04 MED ORDER — WITCH HAZEL-GLYCERIN EX PADS: 1.0000 "application " | MEDICATED_PAD | CUTANEOUS | Status: DC | PRN

## 2018-03-04 MED ORDER — BENZOCAINE-MENTHOL 20-0.5 % EX AERO: 1.0000 "application " | INHALATION_SPRAY | CUTANEOUS | Status: DC | PRN

## 2018-03-04 MED ORDER — DIBUCAINE 1 % RE OINT: 1.0000 "application " | TOPICAL_OINTMENT | RECTAL | Status: DC | PRN

## 2018-03-04 MED ORDER — ZOLPIDEM TARTRATE 5 MG PO TABS: 5.0000 mg | ORAL_TABLET | Freq: Every evening | ORAL | Status: DC | PRN

## 2018-03-04 MED ORDER — DIPHENHYDRAMINE HCL 50 MG/ML IJ SOLN: 12.5000 mg | INTRAMUSCULAR | Status: DC | PRN

## 2018-03-04 MED ORDER — SIMETHICONE 80 MG PO CHEW: 80.0000 mg | CHEWABLE_TABLET | ORAL | Status: DC | PRN

## 2018-03-04 MED ORDER — EPHEDRINE 5 MG/ML INJ
10.0000 mg | INTRAVENOUS | Status: DC | PRN
  Filled 2018-03-04: qty 2

## 2018-03-04 MED ORDER — DIPHENHYDRAMINE HCL 25 MG PO CAPS: 25.0000 mg | ORAL_CAPSULE | Freq: Four times a day (QID) | ORAL | Status: DC | PRN

## 2018-03-04 MED ORDER — TERBUTALINE SULFATE 1 MG/ML IJ SOLN
0.2500 mg | Freq: Once | INTRAMUSCULAR | Status: DC | PRN
  Filled 2018-03-04: qty 1

## 2018-03-04 MED ORDER — FENTANYL CITRATE (PF) 100 MCG/2ML IJ SOLN
100.0000 ug | INTRAMUSCULAR | Status: DC | PRN
  Administered 2018-03-04 (×2): 100 ug via INTRAVENOUS
  Filled 2018-03-04 (×2): qty 2

## 2018-03-04 MED ORDER — ONDANSETRON HCL 4 MG/2ML IJ SOLN: 4.0000 mg | Freq: Four times a day (QID) | INTRAMUSCULAR | Status: DC | PRN

## 2018-03-04 MED ORDER — ACETAMINOPHEN 325 MG PO TABS: 650.0000 mg | ORAL_TABLET | ORAL | Status: DC | PRN

## 2018-03-04 MED ORDER — LACTATED RINGERS IV SOLN: 500.0000 mL | INTRAVENOUS | Status: DC | PRN

## 2018-03-04 MED ORDER — PRENATAL MULTIVITAMIN CH
1.0000 | ORAL_TABLET | Freq: Every day | ORAL | Status: DC
  Administered 2018-03-04 – 2018-03-05 (×2): 1 via ORAL
  Filled 2018-03-04: qty 1

## 2018-03-04 MED ORDER — TETANUS-DIPHTH-ACELL PERTUSSIS 5-2.5-18.5 LF-MCG/0.5 IM SUSP: 0.5000 mL | Freq: Once | INTRAMUSCULAR | Status: DC

## 2018-03-04 MED ORDER — PHENYLEPHRINE 40 MCG/ML (10ML) SYRINGE FOR IV PUSH (FOR BLOOD PRESSURE SUPPORT)
80.0000 ug | PREFILLED_SYRINGE | INTRAVENOUS | Status: DC | PRN
  Filled 2018-03-04: qty 10

## 2018-03-04 MED ORDER — OXYCODONE-ACETAMINOPHEN 5-325 MG PO TABS: 2.0000 | ORAL_TABLET | ORAL | Status: DC | PRN

## 2018-03-04 MED ORDER — OXYCODONE-ACETAMINOPHEN 5-325 MG PO TABS: 1.0000 | ORAL_TABLET | ORAL | Status: DC | PRN

## 2018-03-04 MED ORDER — OXYTOCIN 40 UNITS IN LACTATED RINGERS INFUSION - SIMPLE MED
1.0000 m[IU]/min | INTRAVENOUS | Status: DC
  Administered 2018-03-04: 2 m[IU]/min via INTRAVENOUS

## 2018-03-04 MED ORDER — SOD CITRATE-CITRIC ACID 500-334 MG/5ML PO SOLN: 30.0000 mL | ORAL | Status: DC | PRN

## 2018-03-04 MED ORDER — LACTATED RINGERS IV SOLN
INTRAVENOUS | Status: DC
  Administered 2018-03-04: 07:00:00 via INTRAVENOUS

## 2018-03-04 MED ORDER — COCONUT OIL OIL
1.0000 "application " | TOPICAL_OIL | Status: DC | PRN
  Administered 2018-03-05: 1 via TOPICAL
  Filled 2018-03-04: qty 120

## 2018-03-04 MED ORDER — VANCOMYCIN HCL IN DEXTROSE 1-5 GM/200ML-% IV SOLN
1000.0000 mg | Freq: Two times a day (BID) | INTRAVENOUS | Status: DC
  Administered 2018-03-04: 1000 mg via INTRAVENOUS
  Filled 2018-03-04 (×2): qty 200

## 2018-03-04 MED ORDER — DIPHENHYDRAMINE HCL 50 MG/ML IJ SOLN
25.0000 mg | Freq: Three times a day (TID) | INTRAMUSCULAR | Status: DC | PRN
  Administered 2018-03-04: 25 mg via INTRAVENOUS
  Filled 2018-03-04: qty 1

## 2018-03-04 MED ORDER — SENNOSIDES-DOCUSATE SODIUM 8.6-50 MG PO TABS
2.0000 | ORAL_TABLET | ORAL | Status: DC
  Administered 2018-03-04 – 2018-03-06 (×2): 2 via ORAL
  Filled 2018-03-04 (×2): qty 2

## 2018-03-04 MED ORDER — ONDANSETRON HCL 4 MG PO TABS: 4.0000 mg | ORAL_TABLET | ORAL | Status: DC | PRN

## 2018-03-04 MED ORDER — FENTANYL 2.5 MCG/ML BUPIVACAINE 1/10 % EPIDURAL INFUSION (WH - ANES): 14.0000 mL/h | INTRAMUSCULAR | Status: DC | PRN

## 2018-03-04 MED ORDER — MEASLES, MUMPS & RUBELLA VAC IJ SOLR
0.5000 mL | Freq: Once | INTRAMUSCULAR | Status: DC
  Filled 2018-03-04: qty 0.5

## 2018-03-04 MED ORDER — IBUPROFEN 600 MG PO TABS
600.0000 mg | ORAL_TABLET | Freq: Four times a day (QID) | ORAL | Status: DC
  Administered 2018-03-04 – 2018-03-06 (×7): 600 mg via ORAL
  Filled 2018-03-04 (×7): qty 1

## 2018-03-04 MED ORDER — OXYTOCIN BOLUS FROM INFUSION
500.0000 mL | Freq: Once | INTRAVENOUS | Status: AC
  Administered 2018-03-04: 500 mL via INTRAVENOUS

## 2018-03-04 NOTE — Progress Notes (Signed)
LABOR PROGRESS NOTE  Jody Mooney is a 23 y.o. G1P0 at [redacted]w[redacted]d  admitted for PROM.   Subjective: Patient extremely uncomfortable with contractions. Unable to sit still in the bed.   Objective: BP 124/70   Pulse 84   Temp 98.7 F (37.1 C) (Oral)   Resp 18   Ht 5\' 4"  (1.626 m)   Wt 99.5 kg   LMP 06/09/2017 (Approximate)   SpO2 97%   BMI 37.66 kg/m  or  Vitals:   03/04/18 0542 03/04/18 0641 03/04/18 0648 03/04/18 0924  BP: 126/82  124/70   Pulse: 78  81 84  Resp: 17  18 18   Temp: 98.5 F (36.9 C)  98.7 F (37.1 C)   TempSrc:   Oral   SpO2:    97%  Weight:  99.5 kg    Height:  5\' 4"  (1.626 m)      Dilation: 1.5 Effacement (%): 80 Station: -2 Presentation: Vertex Exam by:: Sharyn Lull, RN  FHT: baseline rate 125, moderate varibility, +acel, no decel Toco: q1-4 min   Labs: Lab Results  Component Value Date   WBC 11.3 (H) 03/04/2018   HGB 12.8 03/04/2018   HCT 37.4 03/04/2018   MCV 88.8 03/04/2018   PLT 210 03/04/2018    Patient Active Problem List   Diagnosis Date Noted  . Normal labor and delivery 03/04/2018  . GBS bacteriuria 01/25/2018  . Positive urine culture 11/08/2017  . Vaginal discharge 11/08/2017  . Ovarian cyst 11/03/2017  . Supervision of normal first pregnancy 09/08/2017  . PVC (premature ventricular contraction) 02/14/2014  . Intermittent asthma 11/03/2011    Assessment / Plan: 23 y.o. G1P0 at [redacted]w[redacted]d here for PROM.   Labor: Cervix remains unchanged from admission on serial cervical exams. Will start augmentation with Pitocin. Discussed I would recommend placing FB but patient unable to tolerate prolonged cervical exam and declines FB placement. Given cervical effacement, cytotec unlikely to be beneficial.  Fetal Wellbeing:  Cat I  Pain Control:  Has received IV Fentanyl x2 and still in significant pain. Will proceed with early epidural placement.  Anticipated MOD:  NSVD  Phill Myron, D.O. OB Fellow  03/04/2018, 9:27 AM

## 2018-03-04 NOTE — MAU Provider Note (Signed)
S: Ms. Jody Mooney is a 23 y.o. G1P0 at [redacted]w[redacted]d  who presents to MAU today complaining of leaking of fluid since 0500. She denies vaginal bleeding. She endorses contractions. She reports normal fetal movement.    O: BP 126/82 (BP Location: Left Arm)   Pulse 78   Temp 98.5 F (36.9 C)   Resp 17   Ht 5\' 4"  (1.626 m)   Wt 99.5 kg   LMP 06/09/2017 (Approximate)   BMI 37.63 kg/m  GENERAL: Well-developed, well-nourished female in no acute distress.  HEAD: Normocephalic, atraumatic.  CHEST: Normal effort of breathing, regular heart rate ABDOMEN: Soft, nontender, gravid PELVIC: Normal external female genitalia. Vagina is pink and rugated. Cervix with normal contour, no lesions. Normal discharge.  Positive pooling.   Cervical exam:  Dilation: 1.5 Effacement (%): 80 Station: -2 Presentation: Vertex Exam by:: Wende Bushy, CNM   Fetal Monitoring: Baseline: 130 Variability: moderate Accelerations: present  Decelerations: none Contractions: 1.5-4  POCT Fern test: Positive    A: SIUP at [redacted]w[redacted]d  SROM  P: Admit to L&D  Report given to L&D team   Lajean Manes, CNM 03/04/2018 6:13 AM

## 2018-03-04 NOTE — Anesthesia Pain Management Evaluation Note (Signed)
  CRNA Pain Management Visit Note  Patient: Jody Mooney, 22 y.o., female  "Hello I am a member of the anesthesia team at [facility]. We have an anesthesia team available at all times to provide care throughout the hospital, including epidural management and anesthesia for C-section. I don't know your plan for the delivery whether it a natural birth, water birth, IV sedation, nitrous supplementation, doula or epidural, but we want to meet your pain goals."   1.Was your pain managed to your expectations on prior hospitalizations?   No prior hospitalizations  2.What is your expectation for pain management during this hospitalization?     Epidural  3.How can we help you reach that goal?   Record the patient's initial score and the patient's pain goal.   Pain: 6  Pain Goal: 8 [facility] wants you to be able to say your pain was always managed very well.  Jabier Mutton 03/04/2018

## 2018-03-04 NOTE — Lactation Note (Signed)
This note was copied from a baby's chart. Lactation Consultation Note  Patient Name: Jody Mooney [id number] Date: 03/04/2018   G1P1 Vaginal delivery of baby Jody Buzan born at [redacted] weeks gestation now 61 hours old.  Mom reports she did not have any breastfeeding education during pregnancy. Mom reports she did get a book from [facility]. Mom with no complications during pregnancy or delivery .Urged to feed on cue and 8 or more feedings in 24 hours.  Reviewed understanding mother and baby booklet, [facility] breastfeeding consultation services,  Breastfeeding resource list and feeding log.  Mom denies need for breastfeeding assistance at this time.  Left name and number white board.   Maternal Data    Feeding Feeding Type: Breast Fed  [facility] Score                   Interventions    Lactation Tools Discussed/Used     Consult Status      Jody Mooney 03/04/2018, 7:35 PM

## 2018-03-04 NOTE — H&P (Addendum)
LABOR AND DELIVERY ADMISSION HISTORY AND PHYSICAL NOTE  Jody Mooney is a 23 y.o. female G1P0 with IUP at 41w0dby LMP presenting for SROM.  She reports positive fetal movement.  [place]:  - GBS + w/ PCN allergy - Yeast infection x2, completed course terconazole and miconazole - Adnexal mass found to be dermoid cysts  Past Medical History: Past Medical History:  Diagnosis Date  . Asthma     Past Surgical History: Past Surgical History:  Procedure Laterality Date  . WISDOM TOOTH EXTRACTION      Obstetrical History: OB History    Gravida  1   Para      Term      Preterm      AB      Living        SAB      TAB      Ectopic      Multiple      Live Births              Social History: Social History   Socioeconomic History  . Marital status: Single    Spouse name: Not on file  . Number of children: Not on file  . Years of education: Not on file  . Highest education level: Not on file  Occupational History  . Occupation: sShip broker Social Needs  . Financial resource strain: Not on file  . Food insecurity:    Worry: Not on file    Inability: Not on file  . Transportation needs:    Medical: Not on file    Non-medical: Not on file  Tobacco Use  . Smoking status: Never Smoker  . Smokeless tobacco: Never Used  Substance and Sexual Activity  . Alcohol use: No  . Drug use: Never  . Sexual activity: Yes    Birth control/protection: None  Lifestyle  . Physical activity:    Days per week: Not on file    Minutes per session: Not on file  . Stress: Not on file  Relationships  . Social connections:    Talks on phone: Not on file    Gets together: Not on file    Attends religious service: Not on file    Active member of club or organization: Not on file    Attends meetings of clubs or organizations: Not on file    Relationship status: Not on file  Other  Topics Concern  . Not on file  Social History Narrative   Currently attending college.    Feels safe in relationship of 2 years.    Family History: Family History  Problem Relation Age of Onset  . Hyperlipidemia Mother   . Anxiety disorder Other        Grandmother and mother  . Depression Other        Grandmother and mother  . Hyperlipidemia Other   . Diabetes type II Other        [place]terparents  . Other Other        VSD repaired in grandmother age 22   Allergies: Allergies  Allergen Reactions  . Penicillins Rash    Has patient had a PCN reaction causing immediate rash, facial/tongue/throat swelling, SOB or lightheadedness with hypotension: Unknown Has patient had a PCN reaction causing severe rash involving mucus membranes or skin necrosis: Unknown Has patient had a PCN reaction that required hospitalization: Unknown Has patient had a PCN reaction  occurring within the last 10 years: no If all of the above answers are "NO", then may proceed with Cephalosporin use.     Medications Prior to Admission  Medication Sig Dispense Refill Last Dose  . Miconazole Nitrate Applicator 544 & 2 MG-% (9GM) KIT Place 1 Applicatorful vaginally at bedtime for 7 days. 24 g 0 Past Week at Unknown time  . Prenatal Vit-Fe Fumarate-FA (PRENATAL MULTIVITAMIN) TABS tablet Take 1 tablet by mouth daily at 12 noon. (Patient not taking: Reported on 02/02/2018) 60 tablet 3 Not Taking at Unknown time     Review of Systems  All systems reviewed and negative except as stated in HPI  Physical Exam Blood pressure 126/82, pulse 78, temperature 98.5 F (36.9 C), resp. rate 17, height '5\' 4"'  (1.626 m), weight 99.5 kg, last menstrual period 06/09/2017. General appearance: alert, oriented, NAD Lungs: normal respiratory effort Heart: regular rate Abdomen: soft, non-tender; gravid, FH appropriate for GA Extremities: No calf swelling or tenderness Presentation: cephalic Fetal monitoring: cat 1 Uterine  activity: inadequate contractions ever 3-4 minutes Dilation: 1.5 Effacement (%): 80 Station: -2 Exam by:: Wende Bushy, CNM  Prenatal labs: ABO, Rh: --/--/O POS Performed at [facility], [address] [place], [place] [address]  [phone]) Antibody: Negative (05/29 1132) Rubella: 6.86 (05/29 1132) RPR: Non Reactive (11/07 1038)  HBsAg: Negative (05/29 1132)  HIV: Non Reactive (11/07 1038)  GC/Chlamydia: neg GBS: POS w/ PCN allergy 1-hr GTT: 103 Genetic screening:  neg Anatomy US: normal  Prenatal Transfer Tool  Maternal Diabetes: No Genetic Screening: Normal Maternal Ultrasounds/Referrals: Normal Fetal Ultrasounds or other Referrals:  None Maternal Substance Abuse:  No Significant Maternal Medications:  None Significant Maternal Lab Results: Lab values include: Group B Strep positive  Results for orders placed or performed during the hospital encounter of 03/04/18 (from the past 24 hour(s))  CBC   Collection Time: 03/04/18  6:22 AM  Result Value Ref Range   WBC 11.3 (H) 4.0 - 10.5 K/uL   RBC 4.21 3.87 - 5.11 MIL/uL   Hemoglobin 12.8 12.0 - 15.0 g/dL   HCT 37.4 36.0 - 46.0 %   MCV 88.8 80.0 - 100.0 fL   MCH 30.4 26.0 - 34.0 pg   MCHC 34.2 30.0 - 36.0 g/dL   RDW 13.3 11.5 - 15.5 %   Platelets 210 150 - 400 K/uL   nRBC 0.0 0.0 - 0.2 %    Patient Active Problem List   Diagnosis Date Noted  . Normal labor and delivery 03/04/2018  . GBS bacteriuria 01/25/2018  . Positive urine culture 11/08/2017  . Vaginal discharge 11/08/2017  . Ovarian cyst 11/03/2017  . Supervision of normal first pregnancy 09/08/2017  . PVC (premature ventricular contraction) 02/14/2014  . Intermittent asthma 11/03/2011    Assessment: Jody Mooney is a 23 y.o. G1P0 at 73w0dhere for SROM. Will treat expectantly, possibly add cytotec +/- FB if does not progress after a few hours. GBS + with pcn allergy. Will start on vancomycin.  #Labor: expectant for now #Pain: Epidural when  needed #FWB: Cat 1 #ID:  GBS + #MOF: breast #MOC:Unsure but thinking IUD #Circ:  Do not know sex of fetus, yes if boy  JGuadalupe DawnMD PGY-2 Family Medicine Resident 03/04/2018, 6:36 AM   OB FELLOW HISTORY AND PHYSICAL ATTESTATION  I have seen and examined this patient; I agree with above documentation in the resident's note.   CPhill Myron D.O. OB Fellow  03/04/2018, 10:55 AM

## 2018-03-04 NOTE — Anesthesia Preprocedure Evaluation (Deleted)

## 2018-03-04 NOTE — MAU Note (Signed)
SROM at 0420-clear fluid.  No VB.  + FM.  Reports ctx every 3-5 mins.

## 2018-03-05 NOTE — Progress Notes (Deleted)
Subjective:    Jody Mooney is a 23 y.o. G1P1001 {Race/ethnicity:17218} female who presents for a postpartum visit. She is {1-10:13787} {time; units:18646} postpartum following a {delivery:12449}. I have fully reviewed the prenatal and intrapartum course. The delivery was at *** gestational weeks. Outcome: {delivery outcome:32078}. Anesthesia: {anesthesia types:812}. Postpartum course has been ***. Baby's course has been ***. Baby is feeding by {breast/bottle:69}. Bleeding {vag bleed:12292}. Bowel function is {normal:32111}. Bladder function is {normal:32111}. Patient {is/is not:9024} sexually active. Contraception method is {contraceptive method:5051}. Postpartum depression screening: {neg default:13464::"negative"}.  The following portions of the patient's history were reviewed and updated as appropriate: allergies, current medications, past medical history, past surgical history and problem list.  Review of Systems Pertinent items are noted in HPI.   There were no vitals filed for this visit.  Objective:     General:  alert, cooperative and no distress   Breasts:  deferred, no complaints  Lungs: clear to auscultation bilaterally  Heart:  regular rate and rhythm  Abdomen: soft, nontender   Vulva: normal  Vagina: normal vagina  Cervix:  closed  Corpus: Well-involuted  Adnexa:  Non-palpable  Rectal Exam: *** hemorrhoids        Assessment:   *** postpartum exam *** wks s/p *** Depression screening Contraception counseling   Plan:  : {method:5051} Follow up in: {1-10:13787} {time; units:19136} for *** or as needed.

## 2018-03-05 NOTE — Progress Notes (Signed)
Post Partum Day 1 Subjective: no complaints, up ad lib, voiding, tolerating PO and + flatus  Objective: Blood pressure 122/76, pulse 82, temperature 99.1 F (37.3 C), temperature source Oral, resp. rate 18, height 5\' 4"  (1.626 m), weight 99.5 kg, last menstrual period 06/09/2017, SpO2 99 %, unknown if currently breastfeeding.  Physical Exam:  General: alert, cooperative and appears stated age Lochia: appropriate Uterine Fundus: firm Incision: healing well, no significant drainage, no dehiscence DVT Evaluation: No evidence of DVT seen on physical exam. Negative Homan's sign. No cords or calf tenderness.  Recent Labs    03/04/18 0622  HGB 12.8  HCT 37.4    Assessment/Plan: Doing well with no issues. Possible discharge later today pending pediatrics team. Unclear if considered adequately treated for GBS due to vanco.    LOS: 1 day   Guadalupe Dawn 03/05/2018, 8:40 AM

## 2018-03-06 ENCOUNTER — Encounter: Payer: Medicaid Other | Admitting: Family Medicine

## 2018-03-06 MED ORDER — IBUPROFEN 600 MG PO TABS: 600.0000 mg | ORAL_TABLET | Freq: Four times a day (QID) | ORAL | 0 refills | Status: DC

## 2018-03-06 NOTE — Discharge Summary (Addendum)
OB Discharge Summary     Patient Name: Jody Mooney DOB: 1996/03/01 MRN: [id number]  Date of admission: 03/04/2018 Delivering MD: Gaylan Gerold R   Date of discharge: 03/07/2018  Admitting diagnosis: 93 wks water broke and ctx Intrauterine pregnancy: [redacted]w[redacted]d     Secondary diagnosis:  Active Problems:   Normal labor and delivery  Additional problems:  GBS + w/ PCN allergy Yeast infection x2, completed terconazole and miconazole Adnexal Mass-> later diagnosed as dermoid cysts     Discharge diagnosis: Term Pregnancy Delivered         Post partum procedures:None Augmentation: Pitocin Complications: None  Hospital course:  Onset of Labor With Vaginal Delivery     23 y.o. yo G1P1001 at [redacted]w[redacted]d was admitted in Active Labor on 03/04/2018. Patient had an uncomplicated labor course as follows: Admitted on 1/4 for PROM. Dilation of 1.5cm at presentation. Managed expectantly initially. After not making much progress patient started on pitocin. Had routine delivery with no complications in the afternoon of 1/4. Patient's post-partum course had no complications.  Membrane Rupture Time/Date: 4:20 AM ,03/04/2018   Intrapartum Procedures: Episiotomy: None [1]                                         Lacerations:  None [1]  Patient had a delivery of a Viable infant. 03/04/2018  Information for the patient's newborn:  Jody Mooney [[id number]]  Delivery Method: [place] had an uncomplicated postpartum course.  She is ambulating, tolerating a regular diet, passing flatus, and urinating well. Patient is discharged home in stable condition on 03/07/18.   Physical exam  Vitals:   03/05/18 0432 03/05/18 1509 03/05/18 2247 03/06/18 0555  BP: 122/76 136/80 (!) 143/82 129/82  Pulse: 82 79 (!) 56 85  Resp: 18 16 18 18   Temp: 99.1 F (37.3 C) 98.4 F (36.9 C) 97.8 F (36.6 C) 98 F (36.7 C)  TempSrc: Oral Oral Oral Axillary  SpO2: 99%     Weight:      Height:       General:  alert, cooperative and no distress Lochia: appropriate Uterine Fundus: firm Incision: Healing well with no significant drainage, No significant erythema, Dressing is clean, dry, and intact DVT Evaluation: No evidence of DVT seen on physical exam. Negative Homan's sign. No cords or calf tenderness. Labs: Lab Results  Component Value Date   WBC 11.3 (H) 03/04/2018   HGB 12.8 03/04/2018   HCT 37.4 03/04/2018   MCV 88.8 03/04/2018   PLT 210 03/04/2018   CMP Latest Ref Rng & Units 08/08/2017  Glucose 65 - 99 mg/dL 73  BUN 6 - 20 mg/dL 13  Creatinine 0.44 - 1.00 mg/dL 0.79  Sodium 135 - 145 mmol/L 135  Potassium 3.5 - 5.1 mmol/L 3.8  Chloride 101 - 111 mmol/L 103  CO2 22 - 32 mmol/L 25  Calcium 8.9 - 10.3 mg/dL 8.9    Discharge instruction: per After Visit Summary and "Baby and Me Booklet".  After visit meds:  Allergies as of 03/06/2018      Reactions   Vancomycin Itching, Other (See Comments)   Red man syndrome   Penicillins Rash   Has patient had a PCN reaction causing immediate rash, facial/tongue/throat swelling, SOB or lightheadedness with hypotension: Unknown Has patient had a PCN reaction causing severe rash involving mucus membranes or skin necrosis: Unknown Has  patient had a PCN reaction that required hospitalization: Unknown Has patient had a PCN reaction occurring within the last 10 years: no If all of the above answers are "NO", then may proceed with Cephalosporin use.      Medication List    TAKE these medications   ibuprofen 600 MG tablet Commonly known as:  ADVIL,MOTRIN Take 1 tablet (600 mg total) by mouth every 6 (six) hours.       Diet: routine diet  Activity: Advance as tolerated. Pelvic rest for 6 weeks.   Outpatient follow up:4 weeks Follow up Appt: Future Appointments  Date Time Provider Department Center  04/10/2018  2:10 PM Bonnita Hollow, MD [facility] [place]   Postpartum contraception: IUD Mirena  Newborn Data: Live born female  Birth  Weight: 6 lb 9.6 oz (2994 g) APGAR: 58, 9  Newborn Delivery   Birth date/time:  03/04/2018 11:38:00 Delivery type:  Vaginal, Spontaneous    Baby Feeding: Breast Disposition:home with mother   03/07/2018 Glenice Bow, DO  Attestation: I have seen this patient and agree with the resident's documentation. I have examined them separately, and we have discussed the plan of care.  Lambert Mody. Juleen China, DO OB/GYN Fellow

## 2018-03-06 NOTE — Plan of Care (Signed)
Patient is progressing well, is ready for discharge

## 2018-03-06 NOTE — Lactation Note (Signed)
This note was copied from a baby's chart. Lactation Consultation Note  Patient Name: Girl Jeanni Allshouse [id number] Date: 03/06/2018 Reason for consult: Follow-up assessment;Term;Primapara;1st time breastfeeding  P1 mother whose infant is now 13 hours old  Mother has been breast feeding and also pumping and bottle feeding.  She feels like baby is latching well.  Mother's nipples are tender but not painful.  She is using coconut oil for comfort.  Mother has been able to pump between 20-30 mls in one session.  She will continue feeding 8-12 times/24 hours and with feeding cues.  Engorgement prevention/treatment discussed.  Mother will be returning to work and has 2 manual pumps at home and is planning on obtaining a DEBP.  She participates in [facility] and I offered a [facility] loaner pump to her.  She will discuss this option with her boyfriend and will let me know if she would like to pursue obtaining a pump.  Mother will call for further questions/concerns or latch assistance prior to discharge as needed.  Father present.   Maternal Data Formula Feeding for Exclusion: No Has patient been taught Hand Expression?: Yes Does the patient have breastfeeding experience prior to this delivery?: No  Feeding Feeding Type: Breast Fed  LATCH Score                   Interventions    Lactation Tools Discussed/Used WIC Program: Yes   Consult Status Consult Status: Complete Date: 03/06/18 Follow-up type: Call as needed    Kadiatou Oplinger R Chaise Mahabir 03/06/2018, 8:21 AM

## 2018-04-10 ENCOUNTER — Encounter: Payer: Self-pay | Admitting: Family Medicine

## 2018-04-10 ENCOUNTER — Ambulatory Visit (INDEPENDENT_AMBULATORY_CARE_PROVIDER_SITE_OTHER): Payer: BLUE CROSS/BLUE SHIELD | Admitting: Family Medicine

## 2018-04-10 ENCOUNTER — Other Ambulatory Visit: Payer: Self-pay

## 2018-04-10 NOTE — Patient Instructions (Addendum)

## 2018-04-10 NOTE — Progress Notes (Signed)
  Subjective:     Jody Mooney is a 23 y.o. female who presents for a postpartum visit. She is 6 weeks postpartum following a spontaneous vaginal delivery. I have fully reviewed the prenatal and intrapartum course. The delivery was at 39.0 gestational weeks. Outcome: spontaneous vaginal delivery. Anesthesia: none. Postpartum course has been uncomplicated. Baby's course has been uncomplicated. Baby is feeding by bottle - Similac Advance. Bleeding no bleeding. Bowel function is normal. Bladder function is normal. Patient is not sexually active. Contraception method is condoms. But is considering IUD. Does not like depo due to weight gain. Postpartum depression screening: negative.  The following portions of the patient's history were reviewed and updated as appropriate: allergies, current medications, past family history, past medical history, past social history, past surgical history and problem list.  Review of Systems Pertinent items are noted in HPI.   Objective:    BP 120/60   Pulse 65   Temp 98.4 F (36.9 C) (Oral)   Wt 204 lb 2 oz (92.6 kg)   LMP 04/03/2018   SpO2 97%   BMI 35.04 kg/m   General:  alert, cooperative and appears stated age  Lungs: clear to auscultation bilaterally and NWOB  Heart:  regular rate and rhythm, S1, S2 normal, no murmur, click, rub or gallop  Abdomen: soft, non-tender; bowel sounds normal; no masses,  no organomegaly  Skin Dry, warm, no rash  Neuro/MSK No edema, moves extremities spontaneously        Assessment:     Routine postpartum exam. Pap smear not done at today's visit.   Plan:    1. Contraception: abstinence, coitus interruptus and condoms 2. Contemplated IUD. Discussed birth spacing, benefits to mother and baby. Addressed questions about IUD and other forms of BC. Pt undecided at the moment.  3. Follow up in: prn

## 2018-05-31 ENCOUNTER — Telehealth: Payer: BLUE CROSS/BLUE SHIELD

## 2018-05-31 ENCOUNTER — Other Ambulatory Visit: Payer: Self-pay

## 2018-05-31 ENCOUNTER — Telehealth: Payer: Self-pay | Admitting: Family Medicine

## 2018-05-31 NOTE — Telephone Encounter (Signed)
Was on for telehealth visit however does not have a medical concern She decided on IUD for birth control I scheduled her for next Wednesday 4/8 to get this done

## 2018-06-07 ENCOUNTER — Other Ambulatory Visit: Payer: Self-pay

## 2018-06-07 ENCOUNTER — Ambulatory Visit (INDEPENDENT_AMBULATORY_CARE_PROVIDER_SITE_OTHER): Payer: BLUE CROSS/BLUE SHIELD | Admitting: Student in an Organized Health Care Education/Training Program

## 2018-06-07 VITALS — BP 108/60 | HR 67 | Temp 98.1°F | Wt 202.2 lb

## 2018-06-07 DIAGNOSIS — Z30430 Encounter for insertion of intrauterine contraceptive device: Secondary | ICD-10-CM | POA: Diagnosis not present

## 2018-06-07 DIAGNOSIS — Z3202 Encounter for pregnancy test, result negative: Secondary | ICD-10-CM | POA: Diagnosis not present

## 2018-06-07 DIAGNOSIS — Z3009 Encounter for other general counseling and advice on contraception: Secondary | ICD-10-CM

## 2018-06-07 LAB — POCT URINE PREGNANCY: Preg Test, Ur: NEGATIVE

## 2018-06-07 MED ORDER — LEVONORGESTREL 20 MCG/24HR IU IUD
INTRAUTERINE_SYSTEM | Freq: Once | INTRAUTERINE | Status: AC
  Administered 2018-06-07: 1 via INTRAUTERINE

## 2018-06-07 NOTE — Patient Instructions (Signed)
It was a pleasure seeing you today in our clinic. Here is the treatment plan we have discussed and agreed upon together:  Please schedule a visit to be seen in one month for a string check   Please use alternative contraception such as condoms for the next 7 days.  Our clinic's number is [phone]. Please call with questions or concerns about what we discussed today.  Be well, Dr. Burr Medico

## 2018-06-07 NOTE — Progress Notes (Signed)
   CC: IUD insertion  HPI: Jody Mooney is a 23 y.o. female 3 months postpartum presenting for IUD insertion.  She is currently menstruating. Pregnancy test was negative. She previously reviewed contraceptive options during her pregnancy and has decided in the [place] IUD.  Review of Symptoms:  See HPI for ROS.   CC, SH/smoking status, and VS noted.  Objective: BP 108/60   Pulse 67   Temp 98.1 F (36.7 C) (Oral)   Wt 202 lb 4 oz (91.7 kg)   SpO2 98%   BMI 34.72 kg/m  GEN: NAD, alert, cooperative, and pleasant. RESPIRATORY: Comfortable work of breathing, speaks in full sentences CV: Regular rate noted, distal extremities well perfused and warm without edema GI: Soft, nondistended SKIN: warm and dry, no rashes or lesions NEURO: II-XII grossly intact MSK: Moves 4 extremities equally PSYCH: AAOx3, appropriate affect Female genitalia: normal external genitalia, vulva, vagina, cervix, uterus and adnexa   GYNECOLOGY CLINIC PROCEDURE NOTE  IUD Insertion Procedure Note Patient identified, informed consent performed.  Discussed risks of irregular bleeding, cramping, infection, malpositioning or misplacement of the IUD outside the uterus which may require further procedures. Time out was performed.  Urine pregnancy test negative.  Speculum placed in the vagina.  Cervix visualized.  Cleaned with Betadine x 2.  Uterus sounded to 7 cm.  Mirena IUD placed per manufacturer's recommendations.  Strings trimmed to 3 cm. Patient tolerated procedure well.   Assessment and plan:  Encounter for IUD insertion Patient was given post-procedure instructions.  Patient was asked to follow up in 4 weeks for IUD check.  Pregnancy test negative  Orders Placed This Encounter  Procedures  . POCT urine pregnancy    Meds ordered this encounter  Medications  . levonorgestrel (MIRENA) 20 MCG/24HR IUD     Everrett Coombe, MD,MS,  PGY3 06/08/2018 8:42 AM

## 2018-06-08 NOTE — Assessment & Plan Note (Signed)
Patient was given post-procedure instructions.  Patient was asked to follow up in 4 weeks for IUD check.

## 2018-06-26 ENCOUNTER — Ambulatory Visit: Payer: BLUE CROSS/BLUE SHIELD

## 2018-07-11 ENCOUNTER — Ambulatory Visit: Payer: BLUE CROSS/BLUE SHIELD | Admitting: Family Medicine

## 2018-07-19 ENCOUNTER — Encounter: Payer: Self-pay | Admitting: Family Medicine

## 2018-07-19 ENCOUNTER — Other Ambulatory Visit: Payer: Self-pay

## 2018-07-19 ENCOUNTER — Ambulatory Visit (INDEPENDENT_AMBULATORY_CARE_PROVIDER_SITE_OTHER): Payer: BLUE CROSS/BLUE SHIELD | Admitting: Family Medicine

## 2018-07-19 VITALS — BP 104/62 | HR 58

## 2018-07-19 DIAGNOSIS — Z30431 Encounter for routine checking of intrauterine contraceptive device: Secondary | ICD-10-CM | POA: Diagnosis not present

## 2018-07-19 NOTE — Progress Notes (Signed)
    Subjective:  Jody Mooney is a 23 y.o. female who presents to the [facility] today with a chief complaint of IUD side effects and string check.   HPI: Patient is complaining of back pain associated with when she got her IUD placed.  It is mild and patient does not have to take any ibuprofen or Tylenol for the pain.  It is not affecting movement or her ability to work at her job back pain was.  She was told that back pain was a major side effect and is concerned that this may be "trick of her mind".  Patient not having any fevers, nausea, vomiting, abdominal pain, dysuria, hematuria, constipation, diarrhea.  Patient denies any change in menses.  Pain is associated with abdominal cramps.  Patient not having any other side effects that she knows of from the IUD.  Patient said IUD placed for approximately 1 month.  The back pain is not getting worse.   ROS: Per HPI   Objective:  Physical Exam: BP 104/62   Pulse (!) 58   LMP 07/03/2018 (Approximate)   SpO2 98%   Breastfeeding No   Gen: NAD, resting comfortably CV: RRR with no murmurs appreciated Pulm: NWOB, CTAB with no crackles, wheezes, or rhonchi GI: Normal bowel sounds present. Soft, Nontender, Nondistended, no CVA tenderness MSK: Back nontender to palpation,  Skin: warm, dry Neuro: grossly normal, moves all extremities Psych: Normal affect and thought content Pelvic: IUD strings visualized, normal cervic, closed os, some mucus, no bleeding, no vaginal lesions  No results found for this or any previous visit (from the past 72 hour(s)).   Assessment/Plan:  IUD check up IUD checked today with strings visualized.  Provided reassurance that patient's back pain is unlikely related to IUD.  Told patient come back if she had any worrisome symptoms.  Recommend symptomatic treatment of back pain with ibuprofen and Tylenol as needed.   Lab Orders  No laboratory test(s) ordered today    No orders of the defined types were placed in this  encounter.     Marny Lowenstein, MD, MS FAMILY MEDICINE RESIDENT - PGY2 07/19/2018 3:19 PM

## 2018-07-19 NOTE — Assessment & Plan Note (Signed)
IUD checked today with strings visualized.  Provided reassurance that patient's back pain is unlikely related to IUD.  Told patient come back if she had any worrisome symptoms.  Recommend symptomatic treatment of back pain with ibuprofen and Tylenol as needed.

## 2019-04-17 ENCOUNTER — Encounter: Payer: Self-pay | Admitting: Family Medicine

## 2019-04-17 ENCOUNTER — Other Ambulatory Visit: Payer: Self-pay

## 2019-04-17 ENCOUNTER — Ambulatory Visit (INDEPENDENT_AMBULATORY_CARE_PROVIDER_SITE_OTHER): Payer: BC Managed Care – PPO | Admitting: Family Medicine

## 2019-04-17 VITALS — BP 102/66 | HR 84 | Wt 207.4 lb

## 2019-04-17 DIAGNOSIS — Z0001 Encounter for general adult medical examination with abnormal findings: Secondary | ICD-10-CM

## 2019-04-17 DIAGNOSIS — Z566 Other physical and mental strain related to work: Secondary | ICD-10-CM

## 2019-04-17 DIAGNOSIS — R079 Chest pain, unspecified: Secondary | ICD-10-CM | POA: Diagnosis not present

## 2019-04-17 DIAGNOSIS — O99210 Obesity complicating pregnancy, unspecified trimester: Secondary | ICD-10-CM | POA: Insufficient documentation

## 2019-04-17 DIAGNOSIS — E6609 Other obesity due to excess calories: Secondary | ICD-10-CM

## 2019-04-17 DIAGNOSIS — Z6835 Body mass index (BMI) 35.0-35.9, adult: Secondary | ICD-10-CM

## 2019-04-17 MED ORDER — PRENATAL + COMPLETE MULTI 0.267 & 373 MG PO THPK: 1.0000 | PACK | Freq: Every morning | ORAL | 11 refills | Status: DC

## 2019-04-17 MED ORDER — DICLOFENAC SODIUM 1 % EX GEL: 2.0000 g | Freq: Four times a day (QID) | CUTANEOUS | 1 refills | Status: DC

## 2019-04-17 NOTE — Assessment & Plan Note (Signed)
Intermittent.  Seems to be emotionally related to stressful job.  Low concern for cardiac involvement at this time as patient has no chest pain during active cardiac movements.  No history of cardiac issue, also very young and unlikely to have cardiac disease.  No family history of early, sudden cardiac disease.  Screened with GAD-7 and PHQ-9 were both negative.  Likely musculoskeletal -Monitor chest pain -Trial of topical diclofenac as needed -Follow-up in 1 month

## 2019-04-17 NOTE — Assessment & Plan Note (Signed)
Possibly hormonal related.  Patient wishes to have her IUD removed.  She is unsure about alternative birth controls at this time.  Recommended nutritional interventions first. -Referral to nutrition

## 2019-04-17 NOTE — Patient Instructions (Addendum)
Health Maintenance, Female Adopting a healthy lifestyle and getting preventive care are important in promoting health and wellness. Ask your health care provider about:  The right schedule for you to have regular tests and exams.  Things you can do on your own to prevent diseases and keep yourself healthy. What should I know about diet, weight, and exercise? Eat a healthy diet   Eat a diet that includes plenty of vegetables, fruits, low-fat dairy products, and lean protein.  Do not eat a lot of foods that are high in solid fats, added sugars, or sodium. Maintain a healthy weight Body mass index (BMI) is used to identify weight problems. It estimates body fat based on height and weight. Your health care provider can help determine your BMI and help you achieve or maintain a healthy weight. Get regular exercise Get regular exercise. This is one of the most important things you can do for your health. Most adults should:  Exercise for at least 150 minutes each week. The exercise should increase your heart rate and make you sweat (moderate-intensity exercise).  Do strengthening exercises at least twice a week. This is in addition to the moderate-intensity exercise.  Spend less time sitting. Even light physical activity can be beneficial. Watch cholesterol and blood lipids Have your blood tested for lipids and cholesterol at 23 years of age, then have this test every 5 years. Have your cholesterol levels checked more often if:  Your lipid or cholesterol levels are high.  You are older than 23 years of age.  You are at high risk for heart disease. What should I know about cancer screening? Depending on your health history and family history, you may need to have cancer screening at various ages. This may include screening for:  Breast cancer.  Cervical cancer.  Colorectal cancer.  Skin cancer.  Lung cancer. What should I know about heart disease, diabetes, and high blood  pressure? Blood pressure and heart disease  High blood pressure causes heart disease and increases the risk of stroke. This is more likely to develop in people who have high blood pressure readings, are of African descent, or are overweight.  Have your blood pressure checked: ? Every 3-5 years if you are 54-9 years of age. ? Every year if you are 69 years old or older. Diabetes Have regular diabetes screenings. This checks your fasting blood sugar level. Have the screening done:  Once every three years after age 36 if you are at a normal weight and have a low risk for diabetes.  More often and at a younger age if you are overweight or have a high risk for diabetes. What should I know about preventing infection? Hepatitis B If you have a higher risk for hepatitis B, you should be screened for this virus. Talk with your health care provider to find out if you are at risk for hepatitis B infection. Hepatitis C Testing is recommended for:  Everyone born from [birth year] through 1964.  Anyone with known risk factors for hepatitis C. Sexually transmitted infections (STIs)  Get screened for STIs, including gonorrhea and chlamydia, if: ? You are sexually active and are younger than 23 years of age. ? You are older than 23 years of age and your health care provider tells you that you are at risk for this type of infection. ? Your sexual activity has changed since you were last screened, and you are at increased risk for chlamydia or gonorrhea. Ask your health care provider  if you are at risk.  Ask your health care provider about whether you are at high risk for HIV. Your health care provider may recommend a prescription medicine to help prevent HIV infection. If you choose to take medicine to prevent HIV, you should first get tested for HIV. You should then be tested every 3 months for as long as you are taking the medicine. Pregnancy  If you are about to stop having your period (premenopausal) and  you may become pregnant, seek counseling before you get pregnant.  Take 400 to 800 micrograms (mcg) of folic acid every day if you become pregnant.  Ask for birth control (contraception) if you want to prevent pregnancy. Osteoporosis and menopause Osteoporosis is a disease in which the bones lose minerals and strength with aging. This can result in bone fractures. If you are 57 years old or older, or if you are at risk for osteoporosis and fractures, ask your health care provider if you should:  Be screened for bone loss.  Take a calcium or vitamin D supplement to lower your risk of fractures.  Be given hormone replacement therapy (HRT) to treat symptoms of menopause. Follow these instructions at home: Lifestyle  Do not use any products that contain nicotine or tobacco, such as cigarettes, e-cigarettes, and chewing tobacco. If you need help quitting, ask your health care provider.  Do not use street drugs.  Do not share needles.  Ask your health care provider for help if you need support or information about quitting drugs. Alcohol use  Do not drink alcohol if: ? Your health care provider tells you not to drink. ? You are pregnant, may be pregnant, or are planning to become pregnant.  If you drink alcohol: ? Limit how much you use to 0-1 drink a day. ? Limit intake if you are breastfeeding.  Be aware of how much alcohol is in your drink. In the [place] one drink equals one 12 oz bottle of beer (355 mL), one 5 oz glass of wine (148 mL), or one 1 oz glass of hard liquor (44 mL). General instructions  Schedule regular health, dental, and eye exams.  Stay current with your vaccines.  Tell your health care provider if: ? You often feel depressed. ? You have ever been abused or do not feel safe at home. Summary  Adopting a healthy lifestyle and getting preventive care are important in promoting health and wellness.  Follow your health care provider's instructions about healthy  diet, exercising, and getting tested or screened for diseases.  Follow your health care provider's instructions on monitoring your cholesterol and blood pressure. This information is not intended to replace advice given to you by your health care provider. Make sure you discuss any questions you have with your health care provider. Document Revised: 02/08/2018 Document Reviewed: 02/08/2018 Elsevier Patient Education  [place].  Nonspecific Chest Pain Chest pain can be caused by many different conditions. Some causes of chest pain can be life-threatening. These will require treatment right away. Serious causes of chest pain include:  Heart attack.  A tear in the body's main blood vessel.  Redness and swelling (inflammation) around your heart.  Blood clot in your lungs. Other causes of chest pain may not be so serious. These include:  Heartburn.  Anxiety or stress.  Damage to bones or muscles in your chest.  Lung infections. Chest pain can feel like:  Pain or discomfort in your chest.  Crushing, pressure, aching, or squeezing pain.  Burning or tingling.  Dull or sharp pain that is worse when you move, cough, or take a deep breath.  Pain or discomfort that is also felt in your back, neck, jaw, shoulder, or arm, or pain that spreads to any of these areas. It is hard to know whether your pain is caused by something that is serious or something that is not so serious. So it is important to see your doctor right away if you have chest pain. Follow these instructions at home: Medicines  Take over-the-counter and prescription medicines only as told by your doctor.  If you were prescribed an antibiotic medicine, take it as told by your doctor. Do not stop taking the antibiotic even if you start to feel better. Lifestyle   Rest as told by your doctor.  Do not use any products that contain nicotine or tobacco, such as cigarettes, e-cigarettes, and chewing tobacco. If  you need help quitting, ask your doctor.  Do not drink alcohol.  Make lifestyle changes as told by your doctor. These may include: ? Getting regular exercise. Ask your doctor what activities are safe for you. ? Eating a heart-healthy diet. A diet and nutrition specialist (dietitian) can help you to learn healthy eating options. ? Staying at a healthy weight. ? Treating diabetes or high blood pressure, if needed. ? Lowering your stress. Activities such as yoga and relaxation techniques can help. General instructions  Pay attention to any changes in your symptoms. Tell your doctor about them or any new symptoms.  Avoid any activities that cause chest pain.  Keep all follow-up visits as told by your doctor. This is important. You may need more testing if your chest pain does not go away. Contact a doctor if:  Your chest pain does not go away.  You feel depressed.  You have a fever. Get help right away if:  Your chest pain is worse.  You have a cough that gets worse, or you cough up blood.  You have very bad (severe) pain in your belly (abdomen).  You pass out (faint).  You have either of these for no clear reason: ? Sudden chest discomfort. ? Sudden discomfort in your arms, back, neck, or jaw.  You have shortness of breath at any time.  You suddenly start to sweat, or your skin gets clammy.  You feel sick to your stomach (nauseous).  You throw up (vomit).  You suddenly feel lightheaded or dizzy.  You feel very weak or tired.  Your heart starts to beat fast, or it feels like it is skipping beats. These symptoms may be an emergency. Do not wait to see if the symptoms will go away. Get medical help right away. Call your local emergency services (911 in the [place] Do not drive yourself to the hospital. Summary  Chest pain can be caused by many different conditions. The cause may be serious and need treatment right away. If you have chest pain, see your doctor right  away.  Follow your doctor's instructions for taking medicines and making lifestyle changes.  Keep all follow-up visits as told by your doctor. This includes visits for any further testing if your chest pain does not go away.  Be sure to know the signs that show that your condition has become worse. Get help right away if you have these symptoms. This information is not intended to replace advice given to you by your health care provider. Make sure you discuss any questions you have with your health  care provider. Document Revised: 08/18/2017 Document Reviewed: 08/18/2017 Elsevier Patient Education  2020 [employer].

## 2019-04-17 NOTE — H&P (View-Only) (Signed)
24 y.o. year old female presents for well woman/preventative visit   Acute Concerns:  Chest pain. Intermittent. Hurts in chest wall. Left sided. No family h/o early cardiac death.  Does not radiate anywhere.  No current CP. Occurs at rest when patient is irrattated by [employer] at work. Denies S/S of heart burn. Not worse with food. Patient actively does cardio work outs multiple times a week. CP never occurred during this timeframe.  Not associated with shortness of breath, lower extremity swelling.  Obesity.  Patient is concerned about her weight.  Says it keeps going up especially of her birth of her child approximately 1 year ago.  Patient is thinking of having her IUD removed as she thinks that this is the likely cause.  States that she works out and tries eat healthy still notices her weight going up.  Curious to discuss interventions.  She is resistant to trying medications.  PHQ-9 and GAD-7 are both 0 throughout.  Diet: "Tries eat healthy"  Exercise: 2-3 times a week, cardio or weights  Sexual/Birth History: G1P1001  Birth Control: IUD  Surgical History: Past Surgical History:  Procedure Laterality Date  . WISDOM TOOTH EXTRACTION      Allergies: Allergies  Allergen Reactions  . Vancomycin Itching and Other (See Comments)    Red man syndrome  . Penicillins Rash    Has patient had a PCN reaction causing immediate rash, facial/tongue/throat swelling, SOB or lightheadedness with hypotension: Unknown Has patient had a PCN reaction causing severe rash involving mucus membranes or skin necrosis: Unknown Has patient had a PCN reaction that required hospitalization: Unknown Has patient had a PCN reaction occurring within the last 10 years: no If all of the above answers are "NO", then may proceed with Cephalosporin use.     Social:  Social History   Socioeconomic History  . Marital status: Single    Spouse name: Not on file  . Number of children: Not on file  . Years  of education: Not on file  . Highest education level: Not on file  Occupational History  . Occupation: Ship broker  Tobacco Use  . Smoking status: Never Smoker  . Smokeless tobacco: Never Used  Substance and Sexual Activity  . Alcohol use: No  . Drug use: Never  . Sexual activity: Yes    Birth control/protection: None  Other Topics Concern  . Not on file  Social History Narrative   Currently attending college.    Feels safe in relationship of 2 years.   Social Determinants of Health   Financial Resource Strain:   . Difficulty of Paying Living Expenses: Not on file  Food Insecurity:   . Worried About Charity fundraiser in the Last Year: Not on file  . Ran Out of Food in the Last Year: Not on file  Transportation Needs:   . Lack of Transportation (Medical): Not on file  . Lack of Transportation (Non-Medical): Not on file  Physical Activity:   . Days of Exercise per Week: Not on file  . Minutes of Exercise per Session: Not on file  Stress:   . Feeling of Stress : Not on file  Social Connections:   . Frequency of Communication with Friends and Family: Not on file  . Frequency of Social Gatherings with Friends and Family: Not on file  . Attends Religious Services: Not on file  . Active Member of Clubs or Organizations: Not on file  . Attends Archivist Meetings: Not on file  .  Marital Status: Not on file    Immunization: Immunization History  Administered Date(s) Administered  . Influenza,inj,Quad PF,6+ Mos 01/12/2013, 01/01/2014, 11/28/2014, 03/08/2016, 11/03/2017  . Meningococcal Conjugate 05/31/2012  . Tdap 07/18/2015, 01/05/2018    Health Maintenance  Topic Date Due  . INFLUENZA VACCINE  05/30/2019 (Originally 09/30/2018)  . CHLAMYDIA SCREENING  04/16/2020 (Originally 12/16/2018)  . PAP-Cervical Cytology Screening  08/09/2020  . PAP SMEAR-Modifier  08/09/2020  . TETANUS/TDAP  01/06/2028  . HIV Screening  Completed    Physical Exam: VITALS:  Reviewed GEN: Pleasant female, NAD HEENT: Normocephalic, PERRL, EOMI, no scleral icterus, bilateral TM pearly grey, nasal septum midline, MMM, uvula midline, no anterior or posterior lymphadenopathy, no thyromegaly CARDIAC:RRR, S1 and S2 present, no murmur, no heaves/thrills RESP: CTAB, normal effort ABD: Soft, no tenderness, normal bowel sounds EXT: No edema, 2+ radial and DP pulses SKIN: Warm and dry, no rash  ASSESSMENT & PLAN: 24 y.o. female presents for annual well woman/preventative exam . Please see problem specific assessment and plan.   Chest pain Intermittent.  Seems to be emotionally related to stressful job.  Low concern for cardiac involvement at this time as patient has no chest pain during active cardiac movements.  No history of cardiac issue, also very young and unlikely to have cardiac disease.  No family history of early, sudden cardiac disease.  Screened with GAD-7 and PHQ-9 were both negative.  Likely musculoskeletal -Monitor chest pain -Trial of topical diclofenac as needed -Follow-up in 1 month  Class 2 obesity due to excess calories without serious comorbidity with body mass index (BMI) of 35.0 to 35.9 in adult Possibly hormonal related.  Patient wishes to have her IUD removed.  She is unsure about alternative birth controls at this time.  Recommended nutritional interventions first. -Referral to nutrition    Josephine Igo, MD, MS PGY-3 [facility]

## 2019-04-17 NOTE — Progress Notes (Signed)
24 y.o. year old female presents for well woman/preventative visit   Acute Concerns:  Chest pain. Intermittent. Hurts in chest wall. Left sided. No family h/o early cardiac death.  Does not radiate anywhere.  No current CP. Occurs at rest when patient is irrattated by [employer]omers at work. Denies S/S of heart burn. Not worse with food. Patient actively does cardio work outs multiple times a week. CP never occurred during this timeframe.  Not associated with shortness of breath, lower extremity swelling.  Obesity.  Patient is concerned about her weight.  Says it keeps going up especially of her birth of her child approximately 1 year ago.  Patient is thinking of having her IUD removed as she thinks that this is the likely cause.  States that she works out and tries eat healthy still notices her weight going up.  Curious to discuss interventions.  She is resistant to trying medications.  PHQ-9 and GAD-7 are both 0 throughout.  Diet: "Tries eat healthy"  Exercise: 2-3 times a week, cardio or weights  Sexual/Birth History: G1P1001  Birth Control: IUD  Surgical History: Past Surgical History:  Procedure Laterality Date  . WISDOM TOOTH EXTRACTION      Allergies: Allergies  Allergen Reactions  . Vancomycin Itching and Other (See Comments)    Red man syndrome  . Penicillins Rash    Has patient had a PCN reaction causing immediate rash, facial/tongue/throat swelling, SOB or lightheadedness with hypotension: Unknown Has patient had a PCN reaction causing severe rash involving mucus membranes or skin necrosis: Unknown Has patient had a PCN reaction that required hospitalization: Unknown Has patient had a PCN reaction occurring within the last 10 years: no If all of the above answers are "NO", then may proceed with Cephalosporin use.     Social:  Social History   Socioeconomic History  . Marital status: Single    Spouse name: Not on file  . Number of children: Not on file  . Years  of education: Not on file  . Highest education level: Not on file  Occupational History  . Occupation: Ship broker  Tobacco Use  . Smoking status: Never Smoker  . Smokeless tobacco: Never Used  Substance and Sexual Activity  . Alcohol use: No  . Drug use: Never  . Sexual activity: Yes    Birth control/protection: None  Other Topics Concern  . Not on file  Social History Narrative   Currently attending college.    Feels safe in relationship of 2 years.   Social Determinants of Health   Financial Resource Strain:   . Difficulty of Paying Living Expenses: Not on file  Food Insecurity:   . Worried About Charity fundraiser in the Last Year: Not on file  . Ran Out of Food in the Last Year: Not on file  Transportation Needs:   . Lack of Transportation (Medical): Not on file  . Lack of Transportation (Non-Medical): Not on file  Physical Activity:   . Days of Exercise per Week: Not on file  . Minutes of Exercise per Session: Not on file  Stress:   . Feeling of Stress : Not on file  Social Connections:   . Frequency of Communication with Friends and Family: Not on file  . Frequency of Social Gatherings with Friends and Family: Not on file  . Attends Religious Services: Not on file  . Active Member of Clubs or Organizations: Not on file  . Attends Archivist Meetings: Not on file  .  Marital Status: Not on file    Immunization: Immunization History  Administered Date(s) Administered  . Influenza,inj,Quad PF,6+ Mos 01/12/2013, 01/01/2014, 11/28/2014, 03/08/2016, 11/03/2017  . Meningococcal Conjugate 05/31/2012  . Tdap 07/18/2015, 01/05/2018    Health Maintenance  Topic Date Due  . INFLUENZA VACCINE  05/30/2019 (Originally 09/30/2018)  . CHLAMYDIA SCREENING  04/16/2020 (Originally 12/16/2018)  . PAP-Cervical Cytology Screening  08/09/2020  . PAP SMEAR-Modifier  08/09/2020  . TETANUS/TDAP  01/06/2028  . HIV Screening  Completed    Physical Exam: VITALS:  Reviewed GEN: Pleasant female, NAD HEENT: Normocephalic, PERRL, EOMI, no scleral icterus, bilateral TM pearly grey, nasal septum midline, MMM, uvula midline, no anterior or posterior lymphadenopathy, no thyromegaly CARDIAC:RRR, S1 and S2 present, no murmur, no heaves/thrills RESP: CTAB, normal effort ABD: Soft, no tenderness, normal bowel sounds EXT: No edema, 2+ radial and DP pulses SKIN: Warm and dry, no rash  ASSESSMENT & PLAN: 24 y.o. female presents for annual well woman/preventative exam . Please see problem specific assessment and plan.   Chest pain Intermittent.  Seems to be emotionally related to stressful job.  Low concern for cardiac involvement at this time as patient has no chest pain during active cardiac movements.  No history of cardiac issue, also very young and unlikely to have cardiac disease.  No family history of early, sudden cardiac disease.  Screened with GAD-7 and PHQ-9 were both negative.  Likely musculoskeletal -Monitor chest pain -Trial of topical diclofenac as needed -Follow-up in 1 month  Class 2 obesity due to excess calories without serious comorbidity with body mass index (BMI) of 35.0 to 35.9 in adult Possibly hormonal related.  Patient wishes to have her IUD removed.  She is unsure about alternative birth controls at this time.  Recommended nutritional interventions first. -Referral to nutrition    Josephine Igo, MD, MS PGY-3 [facility]

## 2019-04-28 ENCOUNTER — Other Ambulatory Visit: Payer: Self-pay

## 2019-04-28 ENCOUNTER — Emergency Department
Admission: EM | Admit: 2019-04-28 | Discharge: 2019-04-29 | Disposition: A | Discharge status: Home / Self Care | Payer: BC Managed Care – PPO | Attending: Emergency Medicine | Admitting: Emergency Medicine

## 2019-04-28 DIAGNOSIS — R102 Pelvic and perineal pain: Secondary | ICD-10-CM | POA: Diagnosis not present

## 2019-04-28 DIAGNOSIS — B9689 Other specified bacterial agents as the cause of diseases classified elsewhere: Secondary | ICD-10-CM | POA: Diagnosis not present

## 2019-04-28 DIAGNOSIS — N390 Urinary tract infection, site not specified: Secondary | ICD-10-CM | POA: Diagnosis not present

## 2019-04-28 DIAGNOSIS — Z975 Presence of (intrauterine) contraceptive device: Secondary | ICD-10-CM | POA: Diagnosis not present

## 2019-04-28 DIAGNOSIS — N760 Acute vaginitis: Secondary | ICD-10-CM | POA: Insufficient documentation

## 2019-04-28 DIAGNOSIS — R1031 Right lower quadrant pain: Secondary | ICD-10-CM | POA: Insufficient documentation

## 2019-04-28 DIAGNOSIS — R52 Pain, unspecified: Secondary | ICD-10-CM

## 2019-04-28 LAB — CBC
HCT: 42.1 % (ref 36.0–46.0)
Hemoglobin: 14 g/dL (ref 12.0–15.0)
MCH: 29.5 pg (ref 26.0–34.0)
MCHC: 33.3 g/dL (ref 30.0–36.0)
MCV: 88.6 fL (ref 80.0–100.0)
Platelets: 274 10*3/uL (ref 150–400)
RBC: 4.75 MIL/uL (ref 3.87–5.11)
RDW: 12.1 % (ref 11.5–15.5)
WBC: 7.8 10*3/uL (ref 4.0–10.5)
nRBC: 0 % (ref 0.0–0.2)

## 2019-04-28 LAB — URINALYSIS, COMPLETE (UACMP) WITH MICROSCOPIC
Bilirubin Urine: NEGATIVE
Glucose, UA: NEGATIVE mg/dL
Hgb urine dipstick: NEGATIVE
Ketones, ur: NEGATIVE mg/dL
Nitrite: NEGATIVE
Protein, ur: NEGATIVE mg/dL
Specific Gravity, Urine: 1.016 (ref 1.005–1.030)
pH: 6 (ref 5.0–8.0)

## 2019-04-28 LAB — WET PREP, GENITAL
Sperm: NONE SEEN
Trich, Wet Prep: NONE SEEN
Yeast Wet Prep HPF POC: NONE SEEN

## 2019-04-28 LAB — BASIC METABOLIC PANEL
Anion gap: 9 (ref 5–15)
BUN: 10 mg/dL (ref 6–20)
CO2: 24 mmol/L (ref 22–32)
Calcium: 9.3 mg/dL (ref 8.9–10.3)
Chloride: 103 mmol/L (ref 98–111)
Creatinine, Ser: 0.68 mg/dL (ref 0.44–1.00)
GFR calc Af Amer: >60 mL/min (ref >60)
GFR calc non Af Amer: >60 mL/min (ref >60)
Glucose, Bld: 84 mg/dL (ref 70–99)
Potassium: 4.1 mmol/L (ref 3.5–5.1)
Sodium: 136 mmol/L (ref 135–145)

## 2019-04-28 LAB — POCT PREGNANCY, URINE: Preg Test, Ur: NEGATIVE

## 2019-04-28 NOTE — ED Provider Notes (Signed)
[facility] Emergency Department Provider Note   ____________________________________________   First MD Initiated Contact with Patient 04/28/19 2313     (approximate)  I have reviewed the triage vital signs and the nursing notes.   HISTORY  Chief Complaint Pelvic Pain and Flank Pain    HPI Jody Mooney is a 24 y.o. female who presents to the ED with a chief complaint of pelvic pain.  Patient has had Mirena IUD x6 months.  Has had occasional right pelvic pain.  Pain exacerbated tonight while at work, especially when patient is walking or sitting.  Patient saw her GYN 2 weeks ago wanting to get the IUD removed but she was advised to wait.  Reports sharp pain radiating from her right pelvis into her right flank.  Denies fever, chills, cough, chest pain, shortness of breath, abdominal pain, nausea, vomiting, dysuria, vaginal discharge.  No concerns for STDs.  Last sexual intercourse 4 days ago.       Past Medical History:  Diagnosis Date  . Asthma   Right ovarian cyst No personal history of kidney stones  Patient Active Problem List   Diagnosis Date Noted  . Class 2 obesity due to excess calories without serious comorbidity with body mass index (BMI) of 35.0 to 35.9 in adult 04/17/2019  . Chest pain 04/17/2019  . 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  . Encounter for general adult medical examination with abnormal findings 11/03/2011    Past Surgical History:  Procedure Laterality Date  . WISDOM TOOTH EXTRACTION      Prior to Admission medications   Medication Sig Start Date End Date Taking? Authorizing Provider  diclofenac Sodium (VOLTAREN) 1 % GEL Apply 2 g topically 4 (four) times daily. 04/17/19   Bonnita Hollow, MD  ibuprofen (ADVIL,MOTRIN) 600 MG tablet Take 1 tablet (600 mg total) by mouth every 6 (six) hours. 03/06/18   Guadalupe Dawn, MD  metroNIDAZOLE (FLAGYL) 500 MG tablet Take 1 tablet (500 mg total) by mouth 2 (two) times daily. 04/29/19   Paulette Blanch, MD  nitrofurantoin, macrocrystal-monohydrate, (MACROBID) 100 MG capsule Take 1 capsule (100 mg total) by mouth 2 (two) times daily. 04/29/19   Paulette Blanch, MD  Prenat-Methylfol-Chol-Fish Oil (PRENATAL + COMPLETE MULTI) 0.267 & 373 MG THPK Take 1 tablet by mouth every morning. 04/17/19   Bonnita Hollow, MD    Allergies Vancomycin and Penicillins  Family History  Problem Relation Age of Onset  . Hyperlipidemia Mother   . Anxiety disorder Other        Grandmother and mother  . Depression Other        Grandmother and mother  . Hyperlipidemia Other   . Diabetes type II Other        [place] parents  . Other Other        VSD repaired in grandmother age 87    Social History Social History   Tobacco Use  . Smoking status: Never Smoker  . Smokeless tobacco: Never Used  Substance Use Topics  . Alcohol use: No  . Drug use: Never    Review of Systems  Constitutional: No fever/chills Eyes: No visual changes. ENT: No sore throat. Cardiovascular: Denies chest pain. Respiratory: Denies shortness of breath. Gastrointestinal: Positive for pelvic pain.  No abdominal pain.  No nausea, no vomiting.  No diarrhea.  No constipation. Genitourinary: Negative for dysuria. Musculoskeletal: Negative for back pain. Skin: Negative for rash. Neurological: Negative for headaches, focal weakness or numbness.  ____________________________________________   PHYSICAL EXAM:  VITAL SIGNS: ED Triage Vitals  Enc Vitals Group     BP 04/28/19 1752 125/66     Pulse Rate 04/28/19 1752 90     Resp 04/28/19 1752 18     Temp 04/28/19 1752 98.1 F (36.7 C)     Temp Source 04/28/19 1752 Oral     SpO2 04/28/19 1752 98 %     Weight 04/28/19 1752 207 lb (93.9 kg)     Height 04/28/19 1752 5\' 4"  (1.626 m)     Head Circumference --      Peak Flow --      Pain Score 04/28/19 1751 8     Pain Loc --      Pain Edu? --      Excl. in [place]? --     Constitutional: Alert and oriented. Well appearing and  in no acute distress. Eyes: Conjunctivae are normal. PERRL. EOMI. Head: Atraumatic. Nose: No congestion/rhinnorhea. Mouth/Throat: Mucous membranes are moist.  Oropharynx non-erythematous. Neck: No stridor.   Cardiovascular: Normal rate, regular rhythm. Grossly normal heart sounds.  Good peripheral circulation. Respiratory: Normal respiratory effort.  No retractions. Lungs CTAB. Gastrointestinal: Soft and nontender to light or deep palpation. No distention. No abdominal bruits. No CVA tenderness. Musculoskeletal: No lower extremity tenderness nor edema.  No joint effusions. Neurologic:  Normal speech and language. No gross focal neurologic deficits are appreciated. No gait instability. Skin:  Skin is warm, dry and intact. No rash noted. Psychiatric: Mood and affect are normal. Speech and behavior are normal.  ____________________________________________   LABS (all labs ordered are listed, but only abnormal results are displayed)  Labs Reviewed  WET PREP, GENITAL - Abnormal; Notable for the following components:      Result Value   Clue Cells Wet Prep HPF POC PRESENT (*)    WBC, Wet Prep HPF POC MANY (*)    All other components within normal limits  URINALYSIS, COMPLETE (UACMP) WITH MICROSCOPIC - Abnormal; Notable for the following components:   Color, Urine YELLOW (*)    APPearance CLOUDY (*)    Leukocytes,Ua LARGE (*)    Bacteria, UA RARE (*)    All other components within normal limits  CBC  BASIC METABOLIC PANEL  POC URINE PREG, ED  POCT PREGNANCY, URINE   ____________________________________________  EKG  None ____________________________________________  RADIOLOGY  ED MD interpretation: IUD in good location; bilateral complex dermoid cysts, stable since 2019  Official radiology report(s): US PELVIC COMPLETE W TRANSVAGINAL AND TORSION R/O  Result Date: 04/29/2019 CLINICAL DATA:  Pelvic pain EXAM: TRANSABDOMINAL AND TRANSVAGINAL ULTRASOUND OF PELVIS DOPPLER  ULTRASOUND OF OVARIES TECHNIQUE: Both transabdominal and transvaginal ultrasound examinations of the pelvis were performed. Transabdominal technique was performed for global imaging of the pelvis including uterus, ovaries, adnexal regions, and pelvic cul-de-sac. It was necessary to proceed with endovaginal exam following the transabdominal exam to visualize the ovaries. Color and duplex Doppler ultrasound was utilized to evaluate blood flow to the ovaries. COMPARISON:  August 09, 2017 FINDINGS: Uterus Measurements: 8.9 x 4.4 x 4.9 = volume: 100 mL. No fibroids or other mass visualized. Endometrium Thickness: 4.3 mm.  IUD seen within the endometrial canal. Right ovary Measurements: 7.5 x 5.7 x 6.7 cm = volume: 151 mL. There is a complex cystic structure within the right ovary measuring 7.2 x 5.7 x 7.3 cm without internal vascularity. There are internal echoes seen throughout. Left ovary Measurements: 4.3 x 3.1 x 2.9 cm = volume: 20 mL.  There is a complex hypoechoic lesion within the left ovary without internal vascularity measuring 2.9 x 2.4 x 2.0 cm. Pulsed Doppler evaluation of both ovaries demonstrates normal low-resistance arterial and venous waveforms. Other findings No abnormal free fluid. IMPRESSION: 1. Normal appearing uterus with IUD in the endometrial canal. 2. Bilateral complex heterogeneously hypoechoic masses likely representing dermoids which were seen on a prior exam dating back to 2019. If further evaluation is required, would recommend MRI. The mass within the right ovary appears to be slightly smaller than on the prior exam measuring up to 7.2 cm, and previously 10.5 cm. The lesion within the left ovary is grossly unchanged measuring up to 2.9 cm. 3. No evidence of ovarian torsion. Electronically Signed   By: Prudencio Pair M.D.   On: 04/29/2019 01:18    ____________________________________________   PROCEDURES  Procedure(s) performed (including Critical Care):  Procedures  Pelvic exam:   External exam within normal limits without rashes, lesions or vesicles.  Speculum exam within normal limits. Cervical os is closed.  Bimanual exam within normal limits. ____________________________________________   INITIAL IMPRESSION / ASSESSMENT AND PLAN / ED COURSE  As part of my medical decision making, I reviewed the following data within the [place] notes reviewed and incorporated, Labs reviewed, Old chart reviewed, Radiograph reviewed and Notes from prior ED visits     JONETTA DEBOCK was evaluated in Emergency Department on 04/29/2019 for the symptoms described in the history of present illness. She was evaluated in the context of the global COVID-19 pandemic, which necessitated consideration that the patient might be at risk for infection with the SARS-CoV-2 virus that causes COVID-19. Institutional protocols and algorithms that pertain to the evaluation of patients at risk for COVID-19 are in a state of rapid change based on information released by regulatory bodies including the CDC and federal and state organizations. These policies and algorithms were followed during the patient's care in the ED.    24 year old female who presents with pelvic pain and concerns of IUD placement. Differential diagnosis includes, but is not limited to, ovarian cyst, ovarian torsion, acute appendicitis, diverticulitis, urinary tract infection/pyelonephritis, endometriosis, bowel obstruction, colitis, renal colic, gastroenteritis, hernia, fibroids, endometriosis, pregnancy related pain including ectopic pregnancy, etc.  Will start antibiotics for UTI and BV.  Proceed with pelvic ultrasound to evaluate IUD placement.  Clinical Course as of Apr 29 151  Sat Apr 28, 2019  2357 Patient informs me she needs to leave right after her ultrasound is done.  She will call back for results.   [JS]  Sun Apr 29, 2019  0128 Patient change her mind and stuck around for her ultrasound results.   I have gone over her ultrasound results with her and will refer her to gynecology as she sees family medicine at [facility] for her IUD but I recommend a specialist for follow-up.  Will discharge home on Macrobid and Flagyl.  Strict return precautions given.  Patient verbalizes understanding and agrees with plan of care.   [JS]    Clinical Course User Index [JS] Paulette Blanch, MD     ____________________________________________   FINAL CLINICAL IMPRESSION(S) / ED DIAGNOSES  Final diagnoses:  Pain  Pelvic pain in female  Bacterial vaginosis  Lower urinary tract infectious disease     ED Discharge Orders         Ordered    metroNIDAZOLE (FLAGYL) 500 MG tablet  2 times daily     04/29/19 0001  nitrofurantoin, macrocrystal-monohydrate, (MACROBID) 100 MG capsule  2 times daily     04/29/19 0001           Note:  This document was prepared using Dragon voice recognition software and may include unintentional dictation errors.   Paulette Blanch, MD 04/29/19 [phone]

## 2019-04-28 NOTE — ED Triage Notes (Addendum)
Pt arrived via POV with reports of pelvic pain, states she thinks it is her Mirena IUD states the pain is there when she is up walking around and sitting down.  Pt states the pain was intense. Pt wanted to get it removed 2 weeks ago, but her Dr told her wait. Pt reports she has had the IUD in place for about 8 months.   Pt c/o right flank pain radiating down to right lower abdomen.

## 2019-04-29 ENCOUNTER — Emergency Department: Payer: BC Managed Care – PPO

## 2019-04-29 DIAGNOSIS — R102 Pelvic and perineal pain: Secondary | ICD-10-CM | POA: Diagnosis not present

## 2019-04-29 MED ORDER — NITROFURANTOIN MONOHYD MACRO 100 MG PO CAPS
100.0000 mg | ORAL_CAPSULE | Freq: Once | ORAL | Status: AC
  Administered 2019-04-29: 100 mg via ORAL
  Filled 2019-04-29: qty 1

## 2019-04-29 MED ORDER — METRONIDAZOLE 500 MG PO TABS
500.0000 mg | ORAL_TABLET | Freq: Once | ORAL | Status: AC
  Administered 2019-04-29: 500 mg via ORAL
  Filled 2019-04-29: qty 1

## 2019-04-29 MED ORDER — NITROFURANTOIN MONOHYD MACRO 100 MG PO CAPS: 100.0000 mg | ORAL_CAPSULE | Freq: Two times a day (BID) | ORAL | 0 refills | Status: DC

## 2019-04-29 MED ORDER — METRONIDAZOLE 500 MG PO TABS: 500.0000 mg | ORAL_TABLET | Freq: Two times a day (BID) | ORAL | 0 refills | Status: DC

## 2019-04-29 NOTE — ED Notes (Signed)
Pt transported to US

## 2019-04-29 NOTE — Discharge Instructions (Signed)
1.  Take antibiotics as prescribed: Macrobid 100 mg twice daily x7 days Flagyl 500 mg twice daily x7 days 2.  You may take Tylenol and/or Ibuprofen as needed for discomfort. 3.  Return to the ER for worsening symptoms, persistent vomiting, heavy vaginal bleeding or other concerns.

## 2019-05-08 ENCOUNTER — Encounter: Payer: Self-pay | Admitting: Obstetrics and Gynecology

## 2019-05-08 ENCOUNTER — Ambulatory Visit (INDEPENDENT_AMBULATORY_CARE_PROVIDER_SITE_OTHER): Payer: BC Managed Care – PPO | Admitting: Obstetrics and Gynecology

## 2019-05-08 ENCOUNTER — Other Ambulatory Visit: Payer: Self-pay

## 2019-05-08 VITALS — BP 100/60 | Ht 64.0 in | Wt 210.0 lb

## 2019-05-08 DIAGNOSIS — D270 Benign neoplasm of right ovary: Secondary | ICD-10-CM

## 2019-05-08 DIAGNOSIS — D271 Benign neoplasm of left ovary: Secondary | ICD-10-CM

## 2019-05-08 NOTE — Progress Notes (Signed)
Patient ID: Jody Mooney, female   DOB: 1995/11/19, 24 y.o.   MRN: [id number]  Reason for Consult: Follow-up (Two ovarian cyst one on either side. )   Referred by Bonnita Hollow, MD  Subjective:     HPI:  Jody Mooney is a 24 y.o. female.  Pain after a recent ER visit.  She was seen with abdominal pain and pelvic ultrasound showed bilateral dermoid cyst.  Comparison to her previous ultrasound in 2019 shows that these dermoids were similar in size at that time.  Her abdominal pain has resolved and improved.  She wonders if this has anything to do with her IUD which was placed about a year ago.  She is interested in having her IUD removed.  She is considering another pregnancy in the near future.  Past Medical History:  Diagnosis Date  . Asthma    Family History  Problem Relation Age of Onset  . Hyperlipidemia Mother   . Anxiety disorder Other        Grandmother and mother  . Depression Other        Grandmother and mother  . Hyperlipidemia Other   . Diabetes type II Other        [place] parents  . Other Other        VSD repaired in grandmother age 79   Past Surgical History:  Procedure Laterality Date  . WISDOM TOOTH EXTRACTION      Short Social History:  Social History   Tobacco Use  . Smoking status: Never Smoker  . Smokeless tobacco: Never Used  Substance Use Topics  . Alcohol use: No    Allergies  Allergen Reactions  . Vancomycin Itching and Other (See Comments)    Red man syndrome  . Penicillins Rash    Has patient had a PCN reaction causing immediate rash, facial/tongue/throat swelling, SOB or lightheadedness with hypotension: Unknown Has patient had a PCN reaction causing severe rash involving mucus membranes or skin necrosis: Unknown Has patient had a PCN reaction that required hospitalization: Unknown Has patient had a PCN reaction occurring within the last 10 years: no If all of the above answers are "NO", then may proceed with Cephalosporin use.    Current Outpatient Medications  Medication Sig Dispense Refill  . diclofenac Sodium (VOLTAREN) 1 % GEL Apply 2 g topically 4 (four) times daily. 50 g 1  . ibuprofen (ADVIL,MOTRIN) 600 MG tablet Take 1 tablet (600 mg total) by mouth every 6 (six) hours. 30 tablet 0  . metroNIDAZOLE (FLAGYL) 500 MG tablet Take 1 tablet (500 mg total) by mouth 2 (two) times daily. 14 tablet 0  . nitrofurantoin, macrocrystal-monohydrate, (MACROBID) 100 MG capsule Take 1 capsule (100 mg total) by mouth 2 (two) times daily. 14 capsule 0  . Prenat-Methylfol-Chol-Fish Oil (PRENATAL + COMPLETE MULTI) 0.267 & 373 MG THPK Take 1 tablet by mouth every morning. 120 each 11   No current facility-administered medications for this visit.    Review of Systems  Constitutional: Negative for chills, fatigue, fever and unexpected weight change.  HENT: Negative for trouble swallowing.  Eyes: Negative for loss of vision.  Respiratory: Negative for cough, shortness of breath and wheezing.  Cardiovascular: Negative for chest pain, leg swelling, palpitations and syncope.  GI: Negative for abdominal pain, blood in stool, diarrhea, nausea and vomiting.  GU: Negative for difficulty urinating, dysuria, frequency and hematuria.  Musculoskeletal: Negative for back pain, leg pain and joint pain.  Skin: Negative for rash.  Neurological: Negative for dizziness, headaches, light-headedness, numbness and seizures.  Psychiatric: Negative for behavioral problem, confusion, depressed mood and sleep disturbance.        Objective:  Objective   Vitals:   05/08/19 1424  BP: 100/60  Weight: 210 lb (95.3 kg)  Height: 5\' 4"  (1.626 m)   Body mass index is 36.05 kg/m.  Physical Exam Vitals and nursing note reviewed.  Constitutional:      Appearance: She is well-developed.  HENT:     Head: Normocephalic and atraumatic.  Eyes:     Pupils: Pupils are equal, round, and reactive to light.  Cardiovascular:     Rate and Rhythm: Normal  rate and regular rhythm.  Pulmonary:     Effort: Pulmonary effort is normal. No respiratory distress.  Skin:    General: Skin is warm and dry.  Neurological:     Mental Status: She is alert and oriented to person, place, and time.  Psychiatric:        Behavior: Behavior normal.        Thought Content: Thought content normal.        Judgment: Judgment normal.         Assessment/Plan:     24 year old with bilateral dermoid cyst.  Right dermoid measures 7.5 x 5.7 x 7.3 cm.  Left ovarian dermoid measures 2.9 x 2.4 x 2 cm.  Discussed management options with the patient included continued monitoring of her dermoid cyst.  These have been stable in size and over 2 years and have not been causing significant continued pain.  Patient has had no torsion events in 2 years.  Discussed options for removal if desired.  Discussed risks with the dermoid remaining such as torsion or rupture causing peritonitis.  Discussed that the procedure could be performed before or after a subsequent pregnancy. At this time she is interested in removal of the right and left dermoid cysts.  She understands that the right dermoid may be large enough to be encompassing the entire ovary.  Because of this there might not be sufficient viable tissue remaining to salvage and save the right ovary.  The cyst on the left ovarian is smaller in size and successful cystectomy is much more likely. Discussed risks of surgical complications such as infection bleeding and damage to surrounding pelvic tissue.  Discussed normal recovery with this type of procedure as well as postoperative expectations.  She is interested is laparoscopic ovarian cystectomy.  Note sent to scheduler.  We will plan for laparoscopic right and left ovarian cystectomy. possible right oophorectomy.   Follow-up to be scheduled based on surgical date.   More than 25 minutes were spent face to face with the patient in the room with more than 50% of the time spent  providing counseling and discussing the plan of management.   Adrian Prows MD [facility] 05/08/2019 3:11 PM

## 2019-05-08 NOTE — Patient Instructions (Addendum)
Ovarian Cyst     An ovarian cyst is a fluid-filled sac that forms on an ovary. The ovaries are small organs that produce eggs in women. Various types of cysts can form on the ovaries. Some may cause symptoms and require treatment. Most ovarian cysts go away on their own, are not cancerous (are benign), and do not cause problems. Common types of ovarian cysts include:  Functional (follicle) cysts. ? Occur during the menstrual cycle, and usually go away with the next menstrual cycle if you do not get pregnant. ? Usually cause no symptoms.  Endometriomas. ? Are cysts that form from the tissue that lines the uterus (endometrium). ? Are sometimes called "chocolate cysts" because they become filled with blood that turns brown. ? Can cause pain in the lower abdomen during intercourse and during your period.  Cystadenoma cysts. ? Develop from cells on the outside surface of the ovary. ? Can get very large and cause lower abdomen pain and pain with intercourse. ? Can cause severe pain if they twist or break open (rupture).  Dermoid cysts. ? Are sometimes found in both ovaries. ? May contain different kinds of body tissue, such as skin, teeth, hair, or cartilage. ? Usually do not cause symptoms unless they get very big.  Theca lutein cysts. ? Occur when too much of a certain hormone (human chorionic gonadotropin) is produced and overstimulates the ovaries to produce an egg. ? Are most common after having procedures used to assist with the conception of a baby (in vitro fertilization). What are the causes? Ovarian cysts may be caused by:  Ovarian hyperstimulation syndrome. This is a condition that can develop from taking fertility medicines. It causes multiple large ovarian cysts to form.  Polycystic ovarian syndrome (PCOS). This is a common hormonal disorder that can cause ovarian cysts, as well as problems with your period or fertility. What increases the risk? The following factors may  make you more likely to develop ovarian cysts:  Being overweight or obese.  Taking fertility medicines.  Taking certain forms of hormonal birth control.  Smoking. What are the signs or symptoms? Many ovarian cysts do not cause symptoms. If symptoms are present, they may include:  Pelvic pain or pressure.  Pain in the lower abdomen.  Pain during sex.  Abdominal swelling.  Abnormal menstrual periods.  Increasing pain with menstrual periods. How is this diagnosed? These cysts are commonly found during a routine pelvic exam. You may have tests to find out more about the cyst, such as:  Ultrasound.  X-ray of the pelvis.  CT scan.  MRI.  Blood tests. How is this treated? Many ovarian cysts go away on their own without treatment. Your health care provider may want to check your cyst regularly for 2-3 months to see if it changes. If you are in menopause, it is especially important to have your cyst monitored closely because menopausal women have a higher rate of ovarian cancer. When treatment is needed, it may include:  Medicines to help relieve pain.  A procedure to drain the cyst (aspiration).  Surgery to remove the whole cyst.  Hormone treatment or birth control pills. These methods are sometimes used to help dissolve a cyst. Follow these instructions at home:  Take over-the-counter and prescription medicines only as told by your health care provider.  Do not drive or use heavy machinery while taking prescription pain medicine.  Get regular pelvic exams and Pap tests as often as told by your health care provider.    Return to your normal activities as told by your health care provider. Ask your health care provider what activities are safe for you.  Do not use any products that contain nicotine or tobacco, such as cigarettes and e-cigarettes. If you need help quitting, ask your health care provider.  Keep all follow-up visits as told by your health care provider.  This is important. Contact a health care provider if:  Your periods are late, irregular, or painful, or they stop.  You have pelvic pain that does not go away.  You have pressure on your bladder or trouble emptying your bladder completely.  You have pain during sex.  You have any of the following in your abdomen: ? A feeling of fullness. ? Pressure. ? Discomfort. ? Pain that does not go away. ? Swelling.  You feel generally ill.  You become constipated.  You lose your appetite.  You develop severe acne.  You start to have more body hair and facial hair.  You are gaining weight or losing weight without changing your exercise and eating habits.  You think you may be pregnant. Get help right away if:  You have abdominal pain that is severe or gets worse.  You cannot eat or drink without vomiting.  You suddenly develop a fever.  Your menstrual period is much heavier than usual. This information is not intended to replace advice given to you by your health care provider. Make sure you discuss any questions you have with your health care provider. Document Revised: 05/16/2017 Document Reviewed: 07/20/2015 Elsevier Patient Education  [place].   Laparoscopy Laparoscopy is a procedure to diagnose diseases in the abdomen. It might be done for a variety of reasons, such as to look for scar tissue, cancer, or a reason for abdomen (abdominal) pain. During the procedure, a thin, flexible tube that has a light and a camera on the end (laparoscope) is inserted through an incision in the abdomen. The image from the camera is shown on a monitor to help your surgeon see inside your body. Tell a health care provider about:  Any allergies you have.  All medicines you are taking, including vitamins, herbs, eye drops, creams, and over-the-counter medicines.  Any problems you or family members have had with anesthetic medicines.  Any blood disorders you have.  Any  surgeries you have had.  Any medical conditions you have. What are the risks? Generally, this is a safe procedure. However, problems may occur, including:  Infection.  Bleeding.  Allergic reactions to medicines or dyes.  Damage to abdominal structures or organs, such as the intestines, liver, stomach, or spleen. What happens before the procedure? Medicines  Ask your health care provider about: ? Changing or stopping your regular medicines. This is especially important if you are taking diabetes medicines or blood thinners. ? Taking medicines such as aspirin and ibuprofen. These medicines can thin your blood. Do not take these medicines unless your health care provider tells you to take them. ? Taking over-the-counter medicines, vitamins, herbs, and supplements.  You may be given antibiotic medicine to help prevent infection. Staying hydrated Follow instructions from your health care provider about hydration, which may include:  Up to 2 hours before the procedure - you may continue to drink clear liquids, such as water, clear fruit juice, black coffee, and plain tea. Eating and drinking restrictions Follow instructions from your health care provider about eating and drinking, which may include:  8 hours before the procedure - stop eating  heavy meals or foods such as meat, fried foods, or fatty foods.  6 hours before the procedure - stop eating light meals or foods, such as toast or cereal.  6 hours before the procedure - stop drinking milk or drinks that contain milk.  2 hours before the procedure - stop drinking clear liquids. General instructions  Ask your health care provider how your surgical site will be marked or identified.  You may be asked to shower with a germ-killing soap.  Plan to have someone take you home from the hospital or clinic.  Plan to have a responsible adult care for you for at least 24 hours after you leave the hospital or clinic. This is  important. What happens during the procedure?   To lower your risk of infection: ? Your health care team will wash or sanitize their hands. ? Hair may be removed from the surgical area. ? Your skin will be washed with soap.  An IV will be inserted into one of your veins.  You will be given a medicine to make you fall asleep (general anesthetic). You may also be given a medicine to help you relax (sedative).  A breathing tube will be placed down your throat to help you breathe during the procedure.  Your abdomen will be filled with an air-like gas so it expands. This will give the surgeon more room to operate and will make your organs easier to see.  Many small incisions will be made in your abdomen.  A laparoscope and other surgical instruments will be inserted into your abdomen through the incisions.  A tissue sample may be removed from an organ for examination (biopsy). This will depend on the reason why you are having this procedure.  The laparoscope and other instruments will be removed from your abdomen.  The gas will be released.  Your incisions will be closed with stitches (sutures) and covered with a bandage (dressing).  Your breathing tube will be removed. The procedure may vary among health care providers and hospitals. What happens after the procedure?   Your blood pressure, heart rate, breathing rate, and blood oxygen level will be monitored until the medicines you were given have worn off.  Do not drive for 24 hours if you were given a sedative during your procedure.  It is up to you to get the results of your procedure. Ask your health care provider, or the department that is doing the procedure, when your results will be ready. Summary  Laparoscopy is a way to look for problems in the abdomen using small incisions.  Follow instructions from your health care provider about how to prepare for the procedure.  Plan to have a responsible adult care for you for  at least 24 hours after you leave the hospital or clinic. This is important. This information is not intended to replace advice given to you by your health care provider. Make sure you discuss any questions you have with your health care provider. Document Revised: 01/28/2017 Document Reviewed: 08/11/2016 Elsevier Patient Education  [place].

## 2019-05-10 ENCOUNTER — Ambulatory Visit: Payer: BC Managed Care – PPO

## 2019-05-10 ENCOUNTER — Telehealth: Payer: Self-pay | Admitting: Obstetrics and Gynecology

## 2019-05-10 NOTE — Telephone Encounter (Signed)
-----   Message from Homero Fellers, MD sent at 05/08/2019  3:13 PM EST ----- Surgery Booking Request Patient Full Name:  Jody Mooney  MRN: [id number]  DOB: June 16, 1995  Surgeon: Homero Fellers, MD  Requested Surgery Date and Time: 2021 Primary Diagnosis AND Code: Bilateral dermoid cysts Secondary Diagnosis and Code:  Surgical Procedure: Laparoscopic right ovarian cystectomy,  left ovarian cystectomy, possible right oophorectomy.  L&D Notification: No Admission Status: same day surgery Length of Surgery: 2 hours Special Case Needs: No H&P: Yes Phone Interview???:  yes Interpreter: No Language:  Medical Clearance:  No Special Scheduling Instructions: none Any known health/anesthesia issues, diabetes, sleep apnea, latex allergy, defibrillator/pacemaker?: No Acuity: P3   (P1 highest, P2 delay may cause harm, P3 low, elective gyn, P4 lowest)

## 2019-05-10 NOTE — Telephone Encounter (Signed)
Recd call from pt ready to schedule surgery ASAP with Dr Gilman Schmidt. Adv that I had opening on 3/16 that I would have to get ok'ed with Dr Gilman Schmidt for her to do H&P paperwork on DOS and place orders today. She was in OR today so I called her cell with the request. She agreed to the DOS, H&P and placing of orders.   Conf w pt that Covid testing would be tomorrow, 3/12, at [place], drive up and wear mask. Adv she would need to quar until DOS. Pt understood. Adv that she would be receiving a phone call from pre-admit soon to adv her with DOS instructions, etc.   Faxing request to Pre-admit now.

## 2019-05-11 ENCOUNTER — Other Ambulatory Visit: Payer: Self-pay | Admitting: Obstetrics and Gynecology

## 2019-05-11 ENCOUNTER — Telehealth: Payer: Self-pay

## 2019-05-11 ENCOUNTER — Other Ambulatory Visit
Admission: RE | Admit: 2019-05-11 | Discharge: 2019-05-11 | Disposition: A | Discharge status: Home / Self Care | Payer: BC Managed Care – PPO | Source: Ambulatory Visit | Attending: Obstetrics and Gynecology | Admitting: Obstetrics and Gynecology

## 2019-05-11 ENCOUNTER — Ambulatory Visit: Payer: BC Managed Care – PPO | Admitting: Family Medicine

## 2019-05-11 DIAGNOSIS — Z01812 Encounter for preprocedural laboratory examination: Secondary | ICD-10-CM | POA: Diagnosis not present

## 2019-05-11 DIAGNOSIS — Z20822 Contact with and (suspected) exposure to covid-19: Secondary | ICD-10-CM | POA: Insufficient documentation

## 2019-05-11 LAB — SARS CORONAVIRUS 2 (TAT 6-24 HRS): SARS Coronavirus 2: NEGATIVE

## 2019-05-11 NOTE — Telephone Encounter (Signed)
Orders placed.

## 2019-05-11 NOTE — Telephone Encounter (Signed)
Due to the surgery being scheduled on 3/11, Preadmit did was not able to call pt. Jody Mooney said that I should contact preadmit and get the instructions for DOS and call and give to the pt.  I received instructions to relay to pt from [facility] in [place] and just called the pt to adv the following:  NPO after midnight before DOS Clear liquids are allowed up to 2 hours prior to surgery to include water, apple juice, Gatorade, black coffee or black tea - sugar is ok but no cream or milk.  Adv pt to leave jewelry at home Adv to wear NO makeup Adv no alcohol 24 hr prior to and 24 hr after surgery  Conf that she takes no meds (as is reflected in her Epic chart) and adv to take no medication on the morning of surgery.

## 2019-05-11 NOTE — Telephone Encounter (Signed)
Pt left msg on triage line in regards to her claim for leave of absence, she says she received a call they needed more information. Says she will talk to Dr Gilman Schmidt when she sees her Tuesday. I called pt back to get more details, no answer, and could not leave a voice msg due to mail box not set up.

## 2019-05-15 ENCOUNTER — Ambulatory Visit
Admission: RE | Admit: 2019-05-15 | Discharge: 2019-05-15 | Disposition: A | Discharge status: Home / Self Care | Payer: BC Managed Care – PPO | Attending: Obstetrics and Gynecology | Admitting: Obstetrics and Gynecology

## 2019-05-15 ENCOUNTER — Encounter
Admission: RE | Disposition: A | Discharge status: Home / Self Care | Payer: Self-pay | Source: Home / Self Care | Attending: Obstetrics and Gynecology

## 2019-05-15 ENCOUNTER — Encounter: Payer: Self-pay | Admitting: Obstetrics and Gynecology

## 2019-05-15 ENCOUNTER — Ambulatory Visit: Payer: BC Managed Care – PPO | Admitting: Certified Registered"

## 2019-05-15 ENCOUNTER — Other Ambulatory Visit: Payer: Self-pay

## 2019-05-15 DIAGNOSIS — N83201 Unspecified ovarian cyst, right side: Secondary | ICD-10-CM | POA: Diagnosis not present

## 2019-05-15 DIAGNOSIS — D271 Benign neoplasm of left ovary: Secondary | ICD-10-CM | POA: Diagnosis not present

## 2019-05-15 DIAGNOSIS — Z881 Allergy status to other antibiotic agents status: Secondary | ICD-10-CM | POA: Diagnosis not present

## 2019-05-15 DIAGNOSIS — J4520 Mild intermittent asthma, uncomplicated: Secondary | ICD-10-CM | POA: Insufficient documentation

## 2019-05-15 DIAGNOSIS — K55049 Acute infarction of large intestine, extent unspecified: Secondary | ICD-10-CM | POA: Diagnosis not present

## 2019-05-15 DIAGNOSIS — D270 Benign neoplasm of right ovary: Secondary | ICD-10-CM | POA: Diagnosis not present

## 2019-05-15 DIAGNOSIS — E669 Obesity, unspecified: Secondary | ICD-10-CM | POA: Diagnosis not present

## 2019-05-15 DIAGNOSIS — Z880 Allergy status to penicillin: Secondary | ICD-10-CM | POA: Diagnosis not present

## 2019-05-15 DIAGNOSIS — N83202 Unspecified ovarian cyst, left side: Secondary | ICD-10-CM | POA: Diagnosis not present

## 2019-05-15 DIAGNOSIS — D369 Benign neoplasm, unspecified site: Secondary | ICD-10-CM | POA: Diagnosis not present

## 2019-05-15 DIAGNOSIS — Z6835 Body mass index (BMI) 35.0-35.9, adult: Secondary | ICD-10-CM | POA: Diagnosis not present

## 2019-05-15 HISTORY — PX: LAPAROSCOPY: SHX197

## 2019-05-15 HISTORY — PX: CYSTOSCOPY: SHX5120

## 2019-05-15 LAB — CBC
HCT: 43 % (ref 36.0–46.0)
Hemoglobin: 14.5 g/dL (ref 12.0–15.0)
MCH: 30 pg (ref 26.0–34.0)
MCHC: 33.7 g/dL (ref 30.0–36.0)
MCV: 89 fL (ref 80.0–100.0)
Platelets: 259 10*3/uL (ref 150–400)
RBC: 4.83 MIL/uL (ref 3.87–5.11)
RDW: 12.3 % (ref 11.5–15.5)
WBC: 8.2 10*3/uL (ref 4.0–10.5)
nRBC: 0 % (ref 0.0–0.2)

## 2019-05-15 LAB — TYPE AND SCREEN
ABO/RH(D): O POS
Antibody Screen: NEGATIVE

## 2019-05-15 LAB — POCT PREGNANCY, URINE: Preg Test, Ur: NEGATIVE

## 2019-05-15 SURGERY — LAPAROSCOPY, DIAGNOSTIC
Anesthesia: General | Site: Bladder

## 2019-05-15 MED ORDER — FENTANYL CITRATE (PF) 100 MCG/2ML IJ SOLN
INTRAMUSCULAR | Status: AC
  Filled 2019-05-15: qty 2

## 2019-05-15 MED ORDER — PHENYLEPHRINE HCL (PRESSORS) 10 MG/ML IV SOLN
INTRAVENOUS | Status: DC | PRN
  Administered 2019-05-15: 150 ug via INTRAVENOUS

## 2019-05-15 MED ORDER — FENTANYL CITRATE (PF) 100 MCG/2ML IJ SOLN
INTRAMUSCULAR | Status: DC | PRN
  Administered 2019-05-15 (×4): 50 ug via INTRAVENOUS

## 2019-05-15 MED ORDER — ONDANSETRON HCL 4 MG/2ML IJ SOLN
INTRAMUSCULAR | Status: AC
  Filled 2019-05-15: qty 2

## 2019-05-15 MED ORDER — PROPOFOL 10 MG/ML IV BOLUS
INTRAVENOUS | Status: AC
  Filled 2019-05-15: qty 20

## 2019-05-15 MED ORDER — PROMETHAZINE HCL 25 MG/ML IJ SOLN: 6.2500 mg | INTRAMUSCULAR | Status: DC | PRN

## 2019-05-15 MED ORDER — ACETAMINOPHEN NICU IV SYRINGE 10 MG/ML
INTRAVENOUS | Status: AC
  Filled 2019-05-15: qty 1

## 2019-05-15 MED ORDER — MIDAZOLAM HCL 2 MG/2ML IJ SOLN
INTRAMUSCULAR | Status: DC | PRN
  Administered 2019-05-15: 2 mg via INTRAVENOUS

## 2019-05-15 MED ORDER — FAMOTIDINE 20 MG PO TABS
ORAL_TABLET | ORAL | Status: AC
  Filled 2019-05-15: qty 1

## 2019-05-15 MED ORDER — FENTANYL CITRATE (PF) 100 MCG/2ML IJ SOLN: 25.0000 ug | INTRAMUSCULAR | Status: DC | PRN

## 2019-05-15 MED ORDER — SUGAMMADEX SODIUM 500 MG/5ML IV SOLN
INTRAVENOUS | Status: DC | PRN
  Administered 2019-05-15: 200 mg via INTRAVENOUS

## 2019-05-15 MED ORDER — BUPIVACAINE HCL (PF) 0.5 % IJ SOLN
INTRAMUSCULAR | Status: DC | PRN
  Administered 2019-05-15: 19 mL

## 2019-05-15 MED ORDER — LACTATED RINGERS IV SOLN: INTRAVENOUS | Status: DC

## 2019-05-15 MED ORDER — PROPOFOL 10 MG/ML IV BOLUS
INTRAVENOUS | Status: DC | PRN
  Administered 2019-05-15: 200 mg via INTRAVENOUS

## 2019-05-15 MED ORDER — GLYCOPYRROLATE 0.2 MG/ML IJ SOLN
INTRAMUSCULAR | Status: DC | PRN
  Administered 2019-05-15: .2 mg via INTRAVENOUS

## 2019-05-15 MED ORDER — ACETAMINOPHEN 500 MG PO TABS: 1000.0000 mg | ORAL_TABLET | Freq: Four times a day (QID) | ORAL | 0 refills | Status: AC | PRN

## 2019-05-15 MED ORDER — KETOROLAC TROMETHAMINE 30 MG/ML IJ SOLN
INTRAMUSCULAR | Status: DC | PRN
  Administered 2019-05-15: 30 mg via INTRAVENOUS

## 2019-05-15 MED ORDER — BUPIVACAINE LIPOSOME 1.3 % IJ SUSP
INTRAMUSCULAR | Status: AC
  Filled 2019-05-15: qty 20

## 2019-05-15 MED ORDER — GLYCOPYRROLATE 0.2 MG/ML IJ SOLN
INTRAMUSCULAR | Status: AC
  Filled 2019-05-15: qty 1

## 2019-05-15 MED ORDER — DEXMEDETOMIDINE HCL IN NACL 80 MCG/20ML IV SOLN
INTRAVENOUS | Status: AC
  Filled 2019-05-15: qty 20

## 2019-05-15 MED ORDER — ONDANSETRON HCL 4 MG/2ML IJ SOLN
INTRAMUSCULAR | Status: DC | PRN
  Administered 2019-05-15 (×2): 4 mg via INTRAVENOUS

## 2019-05-15 MED ORDER — BUPIVACAINE HCL (PF) 0.5 % IJ SOLN
INTRAMUSCULAR | Status: AC
  Filled 2019-05-15: qty 30

## 2019-05-15 MED ORDER — DEXAMETHASONE SODIUM PHOSPHATE 10 MG/ML IJ SOLN
INTRAMUSCULAR | Status: DC | PRN
  Administered 2019-05-15: 10 mg via INTRAVENOUS

## 2019-05-15 MED ORDER — LACTATED RINGERS IV SOLN: INTRAVENOUS | Status: DC | PRN

## 2019-05-15 MED ORDER — DEXMEDETOMIDINE HCL IN NACL 200 MCG/50ML IV SOLN
INTRAVENOUS | Status: DC | PRN
  Administered 2019-05-15 (×5): 4 ug via INTRAVENOUS

## 2019-05-15 MED ORDER — MEPERIDINE HCL 50 MG/ML IJ SOLN: 6.2500 mg | INTRAMUSCULAR | Status: DC | PRN

## 2019-05-15 MED ORDER — ACETAMINOPHEN 10 MG/ML IV SOLN
INTRAVENOUS | Status: DC | PRN
  Administered 2019-05-15: 1000 mg via INTRAVENOUS

## 2019-05-15 MED ORDER — LIDOCAINE HCL (PF) 2 % IJ SOLN
INTRAMUSCULAR | Status: AC
  Filled 2019-05-15: qty 5

## 2019-05-15 MED ORDER — OXYCODONE HCL 5 MG/5ML PO SOLN: 5.0000 mg | Freq: Once | ORAL | Status: DC | PRN

## 2019-05-15 MED ORDER — MIDAZOLAM HCL 2 MG/2ML IJ SOLN
INTRAMUSCULAR | Status: AC
  Filled 2019-05-15: qty 2

## 2019-05-15 MED ORDER — OXYCODONE HCL 5 MG PO TABS: 5.0000 mg | ORAL_TABLET | Freq: Four times a day (QID) | ORAL | 0 refills | Status: AC | PRN

## 2019-05-15 MED ORDER — ROCURONIUM BROMIDE 100 MG/10ML IV SOLN
INTRAVENOUS | Status: DC | PRN
  Administered 2019-05-15: 50 mg via INTRAVENOUS
  Administered 2019-05-15: 10 mg via INTRAVENOUS
  Administered 2019-05-15 (×2): 20 mg via INTRAVENOUS

## 2019-05-15 MED ORDER — KETOROLAC TROMETHAMINE 30 MG/ML IJ SOLN
INTRAMUSCULAR | Status: AC
  Filled 2019-05-15: qty 1

## 2019-05-15 MED ORDER — OXYCODONE HCL 5 MG PO TABS: 5.0000 mg | ORAL_TABLET | Freq: Once | ORAL | Status: DC | PRN

## 2019-05-15 MED ORDER — FAMOTIDINE 20 MG PO TABS
20.0000 mg | ORAL_TABLET | Freq: Once | ORAL | Status: AC
  Administered 2019-05-15: 12:00:00 20 mg via ORAL

## 2019-05-15 MED ORDER — DEXAMETHASONE SODIUM PHOSPHATE 10 MG/ML IJ SOLN
INTRAMUSCULAR | Status: AC
  Filled 2019-05-15: qty 1

## 2019-05-15 MED ORDER — ROCURONIUM BROMIDE 10 MG/ML (PF) SYRINGE
PREFILLED_SYRINGE | INTRAVENOUS | Status: AC
  Filled 2019-05-15: qty 10

## 2019-05-15 MED ORDER — IBUPROFEN 600 MG PO TABS: 600.0000 mg | ORAL_TABLET | Freq: Four times a day (QID) | ORAL | 0 refills | Status: DC | PRN

## 2019-05-15 MED ORDER — LIDOCAINE HCL (CARDIAC) PF 100 MG/5ML IV SOSY
PREFILLED_SYRINGE | INTRAVENOUS | Status: DC | PRN
  Administered 2019-05-15: 100 mg via INTRAVENOUS

## 2019-05-15 SURGICAL SUPPLY — 48 items
ANCHOR TIS RET SYS 1550ML (BAG) ×1 IMPLANT
ANCHOR TIS RET SYS 235ML (MISCELLANEOUS) ×1 IMPLANT
APPLICATOR ARISTA FLEXITIP XL (MISCELLANEOUS) ×1 IMPLANT
BAG URINE DRAIN 2000ML AR STRL (UROLOGICAL SUPPLIES) ×4 IMPLANT
BASIN GRAD PLASTIC 32OZ STRL (MISCELLANEOUS) ×1 IMPLANT
BLADE SURG SZ11 CARB STEEL (BLADE) ×4 IMPLANT
CATH FOL 2WAY LX 16X5 (CATHETERS) ×4 IMPLANT
CHLORAPREP W/TINT 26 (MISCELLANEOUS) ×4 IMPLANT
COVER WAND RF STERILE (DRAPES) ×3 IMPLANT
DERMABOND ADVANCED (GAUZE/BANDAGES/DRESSINGS) ×1
DERMABOND ADVANCED .7 DNX12 (GAUZE/BANDAGES/DRESSINGS) ×3 IMPLANT
DRESSING SURGICEL FIBRLLR 1X2 (HEMOSTASIS) IMPLANT
DRSG SURGICEL FIBRILLAR 1X2 (HEMOSTASIS) ×4
DRSG TEGADERM 2-3/8X2-3/4 SM (GAUZE/BANDAGES/DRESSINGS) ×4 IMPLANT
ELECT REM PT RETURN 9FT ADLT (ELECTROSURGICAL) ×4
ELECTRODE REM PT RTRN 9FT ADLT (ELECTROSURGICAL) ×3 IMPLANT
GLOVE BIOGEL PI IND STRL 6.5 (GLOVE) ×3 IMPLANT
GLOVE BIOGEL PI INDICATOR 6.5 (GLOVE) ×1
GLOVE SURG SYN 6.5 ES PF (GLOVE) ×4 IMPLANT
GLOVE SURG SYN 6.5 PF PI (GLOVE) ×3 IMPLANT
GOWN STRL REUS W/ TWL LRG LVL3 (GOWN DISPOSABLE) ×6 IMPLANT
GOWN STRL REUS W/TWL LRG LVL3 (GOWN DISPOSABLE) ×2
GRASPER SUT TROCAR 14GX15 (MISCELLANEOUS) ×1 IMPLANT
HANDLE YANKAUER SUCT BULB TIP (MISCELLANEOUS) ×1 IMPLANT
HEMOSTAT ARISTA ABSORB 1G (HEMOSTASIS) ×1 IMPLANT
KIT PINK PAD W/HEAD ARE REST (MISCELLANEOUS) ×4
KIT PINK PAD W/HEAD ARM REST (MISCELLANEOUS) ×3 IMPLANT
KIT TURNOVER CYSTO (KITS) ×4 IMPLANT
L-HOOK LAP DISP 36CM (ELECTROSURGICAL) ×4
LABEL OR SOLS (LABEL) ×4 IMPLANT
LHOOK LAP DISP 36CM (ELECTROSURGICAL) IMPLANT
NS IRRIG 500ML POUR BTL (IV SOLUTION) ×4 IMPLANT
PACK GYN LAPAROSCOPIC (MISCELLANEOUS) ×4 IMPLANT
PAD OB MATERNITY 4.3X12.25 (PERSONAL CARE ITEMS) ×4 IMPLANT
PAD PREP 24X41 OB/GYN DISP (PERSONAL CARE ITEMS) ×4 IMPLANT
PENCIL ELECTRO HAND CTR (MISCELLANEOUS) ×1 IMPLANT
SCISSORS METZENBAUM CVD 33 (INSTRUMENTS) ×1 IMPLANT
SET CYSTO W/LG BORE CLAMP LF (SET/KITS/TRAYS/PACK) ×1 IMPLANT
SET TUBE SMOKE EVAC HIGH FLOW (TUBING) ×4 IMPLANT
SLEEVE ENDOPATH XCEL 5M (ENDOMECHANICALS) ×8 IMPLANT
SUT MNCRL 4-0 (SUTURE) ×1
SUT MNCRL 4-0 27XMFL (SUTURE) ×3
SUT MNCRL AB 4-0 PS2 18 (SUTURE) ×4 IMPLANT
SUTURE MNCRL 4-0 27XMF (SUTURE) IMPLANT
SYR 10ML LL (SYRINGE) ×4 IMPLANT
TROCAR BLADELESS 15MM (ENDOMECHANICALS) ×1 IMPLANT
TROCAR XCEL NON-BLD 5MMX100MML (ENDOMECHANICALS) ×4 IMPLANT
TUBING CONNECTING 10 (TUBING) ×2 IMPLANT

## 2019-05-15 NOTE — Op Note (Signed)
Operative Note   05/15/2019  PRE-OP DIAGNOSIS: Bilateral Ovarian Dermoid cysts  POST-OP DIAGNOSIS: same   PROCEDURE: Procedure(s): LAPAROSCOPY DIAGNOSTIC WITH RIGHT OOPHORECTOMY AND LEFT OVARIAN CYSTECTOMY CYSTOSCOPY  EXCISION OF INFARCTED EPIPLOICA  SURGEON: Adrian Prows MD  ASSISTANT: Prentice Docker MD  ANESTHESIA: General   ESTIMATED BLOOD LOSS: 25 cc  COMPLICATIONS:  None  DISPOSITION: PACU - hemodynamically stable.  CONDITION: stable  FINDINGS: Laparoscopic survey of the abdomen revealed a grossly normal uterus, tubes. Significantly enlarged right ovary, left dermoid cyst. Normal liver edge, gallbladder edge and appendix,.No intra-abdominal adhesions were noted.  Infarcted epiploica noted on surface of the rectum.  PROCEDURE IN DETAIL: The patient was taken to the OR where anesthesia was administed. The patient was positioned in dorsal lithotomy in the [place]. The patient was then examined under anesthesia with the above noted findings. The patient was prepped and draped in the normal sterile fashion and bladder was drained using a foley catheter.   Attention was turned to the patient's abdomen where a 15 mm skin incision was made in the umbilical fold, after injection of local anesthesia.  The 5 mm port was then placed under direct visualization with the operative laparoscope  The above noted findings. Pneumoperitoneum was obtained. Patient positioned into Trendelenburg.  A 5 mm trocar was then placed in the left and right lower quadrants under direct visualization with the laparoscope.  Umbilical trocar exchanged with a 15 mm trocar.  The pelvic anatomy was identified.  The right ovary was significantly enlarged.  The right fallopian tube was identified lateral to the right ovary.  The left ovary and uterus were noted in the pelvis all the view was obstructed by enlarged right ovarian cyst.  The harmonic scalpel was used to coagulate and transect the  infundibulopelvic ligament to remove the right ovary.  The right fallopian tube was conserved.  Care was taken to avoid the right ureter which was visualized as inferior to the area of dissection.  Once the right ovary was free it was placed in the right upper quadrant.   A lesion was noted on the rectum after the right ovary was removed.  General surgery was consulted intraoperatively.  They assessed the lesion and noted it to be an infarcted epiploica.  They advised removal with the harmonic scalpel.  They supervised [place] to remove the epiploica.  It was sent to pathology.  No injury to the rectum was noted.  Attention was turned to the left ovary.  The dermoid cyst on the left ovary was identified.  The L-hook cautery was used to incise the ovarian capsule.  The dermoid cyst was dissected from the normal ovarian tissue using the [place] and Medical laboratory scientific officer.  The left dermoid cyst was placed into a surgical bag.  The ovary was brought to the level of the umbilical incision.  Kellys were used to dissect and remove the left ovarian cyst  in pieces until the bag could be removed.  Attention was then turned to the the right ovary.  The right ovary was placed into a new laparoscopic bag.  And brought to the level of the umbilical incision.  Kelly's and Mayo scissors were again used to dissect the right ovary until the entire ovary and its contents could be removed.  Dissection occurred within the bag until the ovary in the bag could be fit through the umbilical incision.  The 15 mm trocar was replaced.  the pelvis was inspected and copiously irrigated.  Small area of  bleeding noted on the left ovary at the site of the cystectomy.  Arista and fibrillar applied.  The left ovary was observed and noted to be hemostatic.  Pressure was dropped in the abdomen to 5 mmHg and hemostasis of the left ovary was maintained.  All of the right ovarian pedicles were hemostatic. No injuries or bleeding was noted.  The umbilical  fascial incision was closed using the [employer] device with 0 Vicryl suture.  All instruments and ports were then removed from the abdomen after gas was expelled and patient was leveled.   The skin was closed with 4-0 monocryl and skin adhesive. The foley catheter was removed from the bladder.   Cystoscopy was performed.  Cystoscope was inserted into the bladder.  Bladder was filled with normal saline.  Bladder was intact and without injury.  Efflux of urine through bilateral ureteral orifices were noted.  The bladder was drained and cystoscope was removed.  Dr. Glennon Mac assisted with all components of this case.  This was a high-level case requiring Insurance risk surveyor.  Dr. Glennon Mac assisted with retraction, manipulation, dissection and transection of tissue.   The patient tolerated the procedure well. All counts were correct x 2. The patient was transferred to the recovery room awake, alert and breathing independently.  Adrian Prows MD [facility] 05/15/2019 6:37 PM

## 2019-05-15 NOTE — Anesthesia Procedure Notes (Signed)
Procedure Name: Intubation Performed by: Fletcher-Harrison, Novi Calia, CRNA Pre-anesthesia Checklist: Patient identified, Emergency Drugs available, Suction available and Patient being monitored Patient Re-evaluated:Patient Re-evaluated prior to induction Oxygen Delivery Method: Circle system utilized Preoxygenation: Pre-oxygenation with 100% oxygen Induction Type: IV induction Ventilation: Mask ventilation without difficulty Laryngoscope Size: McGraph and 3 Grade View: Grade I Tube type: Oral Tube size: 7.0 mm Number of attempts: 1 Airway Equipment and Method: Stylet Placement Confirmation: ETT inserted through vocal cords under direct vision,  positive ETCO2,  CO2 detector and breath sounds checked- equal and bilateral Secured at: 21 cm Tube secured with: Tape Dental Injury: Teeth and Oropharynx as per pre-operative assessment        

## 2019-05-15 NOTE — Transfer of Care (Signed)
Immediate Anesthesia Transfer of Care Note  Patient: Jody Mooney  Procedure(s) Performed: LAPAROSCOPY DIAGNOSTIC WITH RIGHT OOPHORECTOMY AND LEFT OVARIAN CYSTECTOMY (Abdomen) CYSTOSCOPY (N/A Bladder)  Patient Location: PACU  Anesthesia Type:General  Level of Consciousness: sedated  Airway & Oxygen Therapy: Patient connected to face mask oxygen  Post-op Assessment: Post -op Vital signs reviewed and stable  Post vital signs: stable  Last Vitals:  Vitals Value Taken Time  BP 110/58 05/15/19 1830  Temp    Pulse 84 05/15/19 1833  Resp 22 05/15/19 1833  SpO2 100 % 05/15/19 1833  Vitals shown include unvalidated device data.  Last Pain:  Vitals:   05/15/19 1117  TempSrc: Tympanic         Complications: No apparent anesthesia complications

## 2019-05-15 NOTE — Anesthesia Preprocedure Evaluation (Signed)
Anesthesia Evaluation  Patient identified by MRN, date of birth, ID band Patient awake    Reviewed: Allergy & Precautions, NPO status , Patient's Chart, lab work & pertinent test results  History of Anesthesia Complications Negative for: history of anesthetic complications  Airway Mallampati: II  TM Distance: >3 FB Neck ROM: Full    Dental no notable dental hx.    Pulmonary asthma (mild intermittent) , neg sleep apnea,    breath sounds clear to auscultation- rhonchi (-) wheezing      Cardiovascular Exercise Tolerance: Good (-) hypertension(-) CAD, (-) Past MI and (-) Cardiac Stents  Rhythm:Regular Rate:Normal - Systolic murmurs and - Diastolic murmurs    Neuro/Psych neg Seizures negative neurological ROS  negative psych ROS   GI/Hepatic negative GI ROS, Neg liver ROS,   Endo/Other  negative endocrine ROSneg diabetes  Renal/GU negative Renal ROS     Musculoskeletal negative musculoskeletal ROS (+)   Abdominal (+) + obese,   Peds  Hematology negative hematology ROS (+)   Anesthesia Other Findings Past Medical History: No date: Asthma   Reproductive/Obstetrics                             Anesthesia Physical Anesthesia Plan  ASA: II  Anesthesia Plan: General   Post-op Pain Management:    Induction: Intravenous  PONV Risk Score and Plan: 2 and Ondansetron, Dexamethasone and Midazolam  Airway Management Planned: Oral ETT  Additional Equipment:   Intra-op Plan:   Post-operative Plan: Extubation in OR  Informed Consent: I have reviewed the patients History and Physical, chart, labs and discussed the procedure including the risks, benefits and alternatives for the proposed anesthesia with the patient or authorized representative who has indicated his/her understanding and acceptance.     Dental advisory given  Plan Discussed with: CRNA and Anesthesiologist  Anesthesia Plan  Comments:         Anesthesia Quick Evaluation

## 2019-05-15 NOTE — Anesthesia Postprocedure Evaluation (Signed)
Anesthesia Post Note  Patient: Jody Mooney  Procedure(s) Performed: LAPAROSCOPY DIAGNOSTIC WITH RIGHT OOPHORECTOMY AND LEFT OVARIAN CYSTECTOMY (Abdomen) CYSTOSCOPY (N/A Bladder)  Patient location during evaluation: PACU Anesthesia Type: General Level of consciousness: awake and alert Pain management: pain level controlled Vital Signs Assessment: post-procedure vital signs reviewed and stable Respiratory status: spontaneous breathing, nonlabored ventilation and respiratory function stable Cardiovascular status: blood pressure returned to baseline and stable Postop Assessment: no apparent nausea or vomiting Anesthetic complications: no     Last Vitals:  Vitals:   05/15/19 1930 05/15/19 1935  BP:  117/71  Pulse: 64 75  Resp:    Temp:  (!) 36.2 C  SpO2: 100% 100%    Last Pain:  Vitals:   05/15/19 1935  TempSrc:   PainSc: [place]

## 2019-05-15 NOTE — Discharge Instructions (Signed)

## 2019-05-15 NOTE — Interval H&P Note (Signed)
History and Physical Interval Note:  05/15/2019 2:21 PM  Jody Mooney  has presented today for surgery, with the diagnosis of BILATERAL DURMOID CYSTS.  The various methods of treatment have been discussed with the patient and family. After consideration of risks, benefits and other options for treatment, the patient has consented to  Procedure(s): LAPAROSCOPIC OVARIAN CYSTECTOMY (Bilateral) as a surgical intervention.  The patient's history has been reviewed, patient examined, no change in status, stable for surgery.  I have reviewed the patient's chart and labs.  Questions were answered to the patient's satisfaction.     [place]

## 2019-05-16 DIAGNOSIS — Z0289 Encounter for other administrative examinations: Secondary | ICD-10-CM

## 2019-05-17 ENCOUNTER — Telehealth: Payer: Self-pay

## 2019-05-17 NOTE — Telephone Encounter (Signed)
FMLA/DISABILITY form for [employer] filled out, signature obtained and given to KT for processing.

## 2019-05-18 LAB — SURGICAL PATHOLOGY

## 2019-05-22 NOTE — Telephone Encounter (Signed)
Patient is calling on FMLA status. Please advise

## 2019-05-23 ENCOUNTER — Ambulatory Visit: Payer: BC Managed Care – PPO | Admitting: Obstetrics and Gynecology

## 2019-05-24 NOTE — Telephone Encounter (Signed)
Spoke to pt she has been informed 2 set of forms were sent over. Pt was wanting an extension on her FMLA advised pt to speak with CRS at her next appointment, pt understanding and will do that

## 2019-05-29 ENCOUNTER — Ambulatory Visit: Payer: BC Managed Care – PPO | Admitting: Dietician

## 2019-06-05 ENCOUNTER — Other Ambulatory Visit: Payer: Self-pay

## 2019-06-05 ENCOUNTER — Encounter: Payer: Self-pay | Admitting: Obstetrics and Gynecology

## 2019-06-05 ENCOUNTER — Ambulatory Visit (INDEPENDENT_AMBULATORY_CARE_PROVIDER_SITE_OTHER): Payer: BC Managed Care – PPO | Admitting: Obstetrics and Gynecology

## 2019-06-05 VITALS — BP 110/62 | Wt 211.0 lb

## 2019-06-05 DIAGNOSIS — Z30432 Encounter for removal of intrauterine contraceptive device: Secondary | ICD-10-CM | POA: Diagnosis not present

## 2019-06-05 DIAGNOSIS — Z4889 Encounter for other specified surgical aftercare: Secondary | ICD-10-CM

## 2019-06-05 NOTE — Progress Notes (Signed)
  Postoperative Follow-up Patient presents post op from laparoscopic right oophorectomy, left cystectomy for bilateral dermoid cysts, 1 month ago.  Subjective: Patient reports some improvement in her preop symptoms. Eating a regular diet without difficulty. Pain is controlled without any medications.  Activity: normal activities of daily living. Patient reports additional symptom's since surgery of left sided pain occasionally. She would like to have her IUD removed today  Objective: Wt 211 lb (95.7 kg)   BMI 36.22 kg/m  Physical Exam Constitutional:      Appearance: She is well-developed.  Genitourinary:     Vagina and uterus normal.     No lesions in the vagina.     No cervical motion tenderness.     No right or left adnexal mass present.  HENT:     Head: Normocephalic and atraumatic.  Neck:     Thyroid: No thyromegaly.  Cardiovascular:     Rate and Rhythm: Normal rate and regular rhythm.     Heart sounds: Normal heart sounds.  Pulmonary:     Effort: Pulmonary effort is normal.     Breath sounds: Normal breath sounds.  Chest:     Breasts:        Right: No inverted nipple, mass, nipple discharge or skin change.        Left: No inverted nipple, mass, nipple discharge or skin change.  Abdominal:     General: Bowel sounds are normal. There is no distension.     Palpations: Abdomen is soft. There is no mass.  Musculoskeletal:     Cervical back: Neck supple.  Neurological:     Mental Status: She is alert and oriented to person, place, and time.  Skin:    General: Skin is warm and dry.  Psychiatric:        Behavior: Behavior normal.        Thought Content: Thought content normal.        Judgment: Judgment normal.  Vitals reviewed.    Physical Exam:  BP 110/62   Wt 211 lb (95.7 kg)   Breastfeeding No   BMI 36.22 kg/m  Body mass index is 36.22 kg/m. Constitutional: Well nourished, well developed female in no acute distress.  Abdomen: diffusely non tender to palpation,  non distended, and no masses, hernias Neuro: Grossly intact Psych:  Normal mood and affect.    Pelvic exam:  Two IUD strings absent  from the cervical os. EGBUS, vaginal vault and cervix: within normal limits  IUD Removal Strings of IUD identified and grasped inside cervical canal.  IUD removed without problem.  Pt tolerated this well.  IUD noted to be intact.  Assessment: s/p :  laparoscopic right oophorectomy, left cystectomy,  stable   Plan: IUD removed and plan for contraception is no method. She desires to conceive in the future. She was amenable to this plan.  Patient has done well after surgery with no apparent complications.  I have discussed the post-operative course to date, and the expected progress moving forward.  The patient understands what complications to be concerned about.  I will see the patient in routine follow up, or sooner if needed.    Activity plan: No restriction..  Pelvic rest.  Hiyab Nhem R Sadler Teschner 06/05/2019, 3:26 PM

## 2019-06-14 ENCOUNTER — Telehealth: Payer: Self-pay

## 2019-06-14 NOTE — Telephone Encounter (Signed)
Pt called triage needing a return to work from. to return on Monday 18th

## 2019-07-05 ENCOUNTER — Telehealth: Payer: Self-pay

## 2019-07-05 NOTE — Telephone Encounter (Signed)
Patient requesting copy of latest return to work note. [id number]

## 2019-07-05 NOTE — Telephone Encounter (Signed)
Spoke w/patient. Advised note was sent/can be seen in my chart. She hasn't been active since 04/2018 and doesn't remember login info. Given Username and reset password for patient.

## 2019-12-10 ENCOUNTER — Ambulatory Visit (INDEPENDENT_AMBULATORY_CARE_PROVIDER_SITE_OTHER): Payer: Medicaid Other | Admitting: Obstetrics and Gynecology

## 2019-12-10 ENCOUNTER — Encounter: Payer: Self-pay | Admitting: Obstetrics and Gynecology

## 2019-12-10 ENCOUNTER — Other Ambulatory Visit: Payer: Self-pay

## 2019-12-10 VITALS — BP 112/68 | Ht 64.0 in | Wt 215.0 lb

## 2019-12-10 DIAGNOSIS — Z319 Encounter for procreative management, unspecified: Secondary | ICD-10-CM | POA: Diagnosis not present

## 2019-12-10 NOTE — Progress Notes (Signed)
Patient ID: DAYJA LOVERIDGE, female   DOB: 04/06/1995, 24 y.o.   MRN: [id number]  Reason for Consult: Gynecologic Exam   Referred by Lurline Del, DO  Subjective:     HPI:  COLEY LITTLES is a 24 y.o. female. She reports that she would like to conceive.   Past Medical History:  Diagnosis Date  . Asthma    Family History  Problem Relation Age of Onset  . Hyperlipidemia Mother   . Anxiety disorder Other        Grandmother and mother  . Depression Other        Grandmother and mother  . Hyperlipidemia Other   . Diabetes type II Other        [place] parents  . Other Other        VSD repaired in grandmother age 30   Past Surgical History:  Procedure Laterality Date  . CYSTOSCOPY N/A 05/15/2019   Procedure: CYSTOSCOPY;  Surgeon: Homero Fellers, MD;  Location: [facility];  Service: Gynecology;  Laterality: N/A;  . LAPAROSCOPY  05/15/2019   Procedure: LAPAROSCOPY DIAGNOSTIC WITH RIGHT OOPHORECTOMY AND LEFT OVARIAN CYSTECTOMY;  Surgeon: Homero Fellers, MD;  Location: [facility];  Service: Gynecology;;  . WISDOM TOOTH EXTRACTION      Short Social History:  Social History   Tobacco Use  . Smoking status: Never Smoker  . Smokeless tobacco: Never Used  Substance Use Topics  . Alcohol use: No    Allergies  Allergen Reactions  . Vancomycin Itching and Other (See Comments)    Red man syndrome  . Penicillins Rash    Has patient had a PCN reaction causing immediate rash, facial/tongue/throat swelling, SOB or lightheadedness with hypotension: Unknown Has patient had a PCN reaction causing severe rash involving mucus membranes or skin necrosis: Unknown Has patient had a PCN reaction that required hospitalization: Unknown Has patient had a PCN reaction occurring within the last 10 years: no If all of the above answers are "NO", then may proceed with Cephalosporin use.     Current Outpatient Medications  Medication Sig Dispense Refill  . acetaminophen (TYLENOL) 500 MG  tablet Take 2 tablets (1,000 mg total) by mouth every 6 (six) hours as needed. (Patient not taking: Reported on 06/05/2019) 100 tablet 0  . ibuprofen (ADVIL) 600 MG tablet Take 1 tablet (600 mg total) by mouth every 6 (six) hours as needed. (Patient not taking: Reported on 06/05/2019) 60 tablet 0  . oxyCODONE (ROXICODONE) 5 MG immediate release tablet Take 1 tablet (5 mg total) by mouth every 6 (six) hours as needed for severe pain. (Patient not taking: Reported on 06/05/2019) 20 tablet 0   No current facility-administered medications for this visit.    REVIEW OF SYSTEMS      Objective:  Objective   Vitals:   12/10/19 1517  BP: 112/68  Weight: 215 lb (97.5 kg)  Height: 5\' 4"  (1.626 m)   Body mass index is 36.9 kg/m.  Physical Exam Vitals and nursing note reviewed.  Constitutional:      Appearance: She is well-developed.  HENT:     Head: Normocephalic and atraumatic.  Eyes:     Pupils: Pupils are equal, round, and reactive to light.  Cardiovascular:     Rate and Rhythm: Normal rate and regular rhythm.  Pulmonary:     Effort: Pulmonary effort is normal. No respiratory distress.  Abdominal:     General: Abdomen is flat.     Palpations: Abdomen is  soft.  Genitourinary:    Comments: Declined Skin:    General: Skin is warm and dry.  Neurological:     Mental Status: She is alert and oriented to person, place, and time.  Psychiatric:        Behavior: Behavior normal.        Thought Content: Thought content normal.        Judgment: Judgment normal.         Assessment/Plan:     24 yo Who desires pregnancy Discussed tips for timing of intercourse for conception.  Encouraged monitoring of first day of LMP.  Encouraged taking a daily prenatal vitamin  More than 20 minutes were spent face to face with the patient in the room, reviewing the medical record, labs and images, and coordinating care for the patient. The plan of management was discussed in detail and counseling was  provided.    Adrian Prows MD [facility], [facility] 12/10/2019 3:41 PM

## 2019-12-10 NOTE — Progress Notes (Signed)
Pt states she has pain in her left ovary. This has been going on for 2 weeks . This is off and on.

## 2020-03-11 ENCOUNTER — Other Ambulatory Visit: Payer: Self-pay

## 2020-03-11 ENCOUNTER — Ambulatory Visit (INDEPENDENT_AMBULATORY_CARE_PROVIDER_SITE_OTHER): Payer: Medicaid Other | Admitting: Obstetrics and Gynecology

## 2020-03-11 ENCOUNTER — Encounter: Payer: Self-pay | Admitting: Obstetrics and Gynecology

## 2020-03-11 VITALS — BP 124/70 | Ht 64.0 in | Wt 221.6 lb

## 2020-03-11 DIAGNOSIS — N926 Irregular menstruation, unspecified: Secondary | ICD-10-CM | POA: Diagnosis not present

## 2020-03-11 DIAGNOSIS — N911 Secondary amenorrhea: Secondary | ICD-10-CM

## 2020-03-11 LAB — POCT URINE PREGNANCY: Preg Test, Ur: NEGATIVE

## 2020-03-11 NOTE — Progress Notes (Signed)
Patient ID: Jody Mooney, female   DOB: Apr 14, 1995, 25 y.o.   MRN: [id number]  Reason for Consult: Gynecologic Exam (Amenorrhea x 40 days)   Referred by Lurline Del, DO  Subjective:     HPI:  Jody Mooney is a 25 y.o. female. She reports her LMP is 01/22/2020. She is concerned since she is late for a menstrual cycle. She has been trying to conceive since about October. She has taken two at home pregnancy tests which were negative. She has recently started healthy lifestyle changes. She is exercising 5-6 times a week.   Past Medical History:  Diagnosis Date  . Asthma    Family History  Problem Relation Age of Onset  . Hyperlipidemia Mother   . Anxiety disorder Other        Grandmother and mother  . Depression Other        Grandmother and mother  . Hyperlipidemia Other   . Diabetes type II Other        [place] parents  . Other Other        VSD repaired in grandmother age 72   Past Surgical History:  Procedure Laterality Date  . CYSTOSCOPY N/A 05/15/2019   Procedure: CYSTOSCOPY;  Surgeon: Homero Fellers, MD;  Location: [facility];  Service: Gynecology;  Laterality: N/A;  . LAPAROSCOPY  05/15/2019   Procedure: LAPAROSCOPY DIAGNOSTIC WITH RIGHT OOPHORECTOMY AND LEFT OVARIAN CYSTECTOMY;  Surgeon: Homero Fellers, MD;  Location: [facility];  Service: Gynecology;;  . WISDOM TOOTH EXTRACTION      Short Social History:  Social History   Tobacco Use  . Smoking status: Never Smoker  . Smokeless tobacco: Never Used  Substance Use Topics  . Alcohol use: No    Allergies  Allergen Reactions  . Vancomycin Itching and Other (See Comments)    Red man syndrome  . Penicillins Rash    Has patient had a PCN reaction causing immediate rash, facial/tongue/throat swelling, SOB or lightheadedness with hypotension: Unknown Has patient had a PCN reaction causing severe rash involving mucus membranes or skin necrosis: Unknown Has patient had a PCN reaction that required  hospitalization: Unknown Has patient had a PCN reaction occurring within the last 10 years: no If all of the above answers are "NO", then may proceed with Cephalosporin use.     Current Outpatient Medications  Medication Sig Dispense Refill  . acetaminophen (TYLENOL) 500 MG tablet Take 2 tablets (1,000 mg total) by mouth every 6 (six) hours as needed. (Patient not taking: Reported on 06/05/2019) 100 tablet 0  . ibuprofen (ADVIL) 600 MG tablet Take 1 tablet (600 mg total) by mouth every 6 (six) hours as needed. (Patient not taking: Reported on 06/05/2019) 60 tablet 0  . oxyCODONE (ROXICODONE) 5 MG immediate release tablet Take 1 tablet (5 mg total) by mouth every 6 (six) hours as needed for severe pain. (Patient not taking: Reported on 06/05/2019) 20 tablet 0   No current facility-administered medications for this visit.    Review of Systems  Constitutional: Negative for chills, fatigue, fever and unexpected weight change.  HENT: Negative for trouble swallowing.  Eyes: Negative for loss of vision.  Respiratory: Negative for cough, shortness of breath and wheezing.  Cardiovascular: Negative for chest pain, leg swelling, palpitations and syncope.  GI: Negative for abdominal pain, blood in stool, diarrhea, nausea and vomiting.  GU: Negative for difficulty urinating, dysuria, frequency and hematuria.  Musculoskeletal: Negative for back pain, leg pain and joint pain.  Skin: Negative for rash.  Neurological: Negative for dizziness, headaches, light-headedness, numbness and seizures.  Psychiatric: Negative for behavioral problem, confusion, depressed mood and sleep disturbance.        Objective:  Objective   Vitals:   03/11/20 1006  BP: 124/70  Weight: 221 lb 9.6 oz (100.5 kg)  Height: 5\' 4"  (1.626 m)   Body mass index is 38.04 kg/m.  Physical Exam Vitals and nursing note reviewed.  Constitutional:      Appearance: She is well-developed.  HENT:     Head: Normocephalic and atraumatic.   Cardiovascular:     Rate and Rhythm: Normal rate and regular rhythm.  Pulmonary:     Effort: Pulmonary effort is normal.     Breath sounds: Normal breath sounds.  Abdominal:     General: Bowel sounds are normal.     Palpations: Abdomen is soft.  Musculoskeletal:        General: Normal range of motion.  Skin:    General: Skin is warm and dry.  Neurological:     Mental Status: She is alert and oriented to person, place, and time.  Psychiatric:        Behavior: Behavior normal.        Thought Content: Thought content normal.        Judgment: Judgment normal.     Assessment/Plan:     25 yo with irregular menstrual cycle Discussed evaluation for PCOS today versus after 6 months of irregular cycles.  Patient would like to to continue to observe periods and their intervals.  Would like lab testing for pregnancy today.  Will return for PCOS evaluation if periods still irregular over the next 6 months.   More than 20 minutes were spent face to face with the patient in the room, reviewing the medical record, labs and images, and coordinating care for the patient. The plan of management was discussed in detail and counseling was provided.      Adrian Prows MD [facility] 03/11/2020 10:20 AM

## 2020-03-11 NOTE — Progress Notes (Signed)
Amenorrhea x 40 days

## 2020-03-12 LAB — BETA HCG QUANT (REF LAB): hCG Quant: <1 m[IU]/mL

## 2020-04-23 ENCOUNTER — Other Ambulatory Visit: Payer: Self-pay

## 2020-04-23 ENCOUNTER — Ambulatory Visit (INDEPENDENT_AMBULATORY_CARE_PROVIDER_SITE_OTHER): Payer: Medicaid Other | Admitting: Obstetrics and Gynecology

## 2020-04-23 ENCOUNTER — Encounter: Payer: Self-pay | Admitting: Obstetrics and Gynecology

## 2020-04-23 ENCOUNTER — Other Ambulatory Visit (HOSPITAL_COMMUNITY)
Admission: RE | Admit: 2020-04-23 | Discharge: 2020-04-23 | Disposition: A | Discharge status: Home / Self Care | Payer: Medicaid Other | Source: Ambulatory Visit | Attending: Obstetrics and Gynecology | Admitting: Obstetrics and Gynecology

## 2020-04-23 VITALS — BP 120/72 | Ht 64.0 in | Wt 215.4 lb

## 2020-04-23 DIAGNOSIS — Z124 Encounter for screening for malignant neoplasm of cervix: Secondary | ICD-10-CM

## 2020-04-23 DIAGNOSIS — Z01419 Encounter for gynecological examination (general) (routine) without abnormal findings: Secondary | ICD-10-CM

## 2020-04-23 DIAGNOSIS — Z0000 Encounter for general adult medical examination without abnormal findings: Secondary | ICD-10-CM | POA: Diagnosis not present

## 2020-04-23 DIAGNOSIS — R21 Rash and other nonspecific skin eruption: Secondary | ICD-10-CM

## 2020-04-23 DIAGNOSIS — Z113 Encounter for screening for infections with a predominantly sexual mode of transmission: Secondary | ICD-10-CM | POA: Diagnosis not present

## 2020-04-23 NOTE — Patient Instructions (Signed)
Institute [employer] Recommended Dietary Allowances for Calcium and Vitamin D  Age (yr) Calcium Recommended Dietary Allowance (mg/day) Vitamin D Recommended Dietary Allowance (international units/day)  9-18 1,300 600  19-50 1,000 600  51-70 1,200 600  71 and older 1,200 800  Data from [employer]. Dietary reference intakes: calcium, vitamin D. [place], [place]: National Academies [employer]; 2011.    Exercising to Stay Healthy To become healthy and stay healthy, it is recommended that you do moderate-intensity and vigorous-intensity exercise. You can tell that you are exercising at a moderate intensity if your heart starts beating faster and you start breathing faster but can still hold a conversation. You can tell that you are exercising at a vigorous intensity if you are breathing much harder and faster and cannot hold a conversation while exercising. Exercising regularly is important. It has many health benefits, such as:  Improving overall fitness, flexibility, and endurance.  Increasing bone density.  Helping with weight control.  Decreasing body fat.  Increasing muscle strength.  Reducing stress and tension.  Improving overall health. How often should I exercise? Choose an activity that you enjoy, and set realistic goals. Your health care provider can help you make an activity plan that works for you. Exercise regularly as told by your health care provider. This may include:  Doing strength training two times a week, such as: ? Lifting weights. ? Using resistance bands. ? Push-ups. ? Sit-ups. ? Yoga.  Doing a certain intensity of exercise for a given amount of time. Choose from these options: ? A total of 150 minutes of moderate-intensity exercise every week. ? A total of 75 minutes of vigorous-intensity exercise every week. ? A mix of moderate-intensity and vigorous-intensity exercise every week. Children, pregnant women, people who have not exercised  regularly, people who are overweight, and older adults may need to talk with a health care provider about what activities are safe to do. If you have a medical condition, be sure to talk with your health care provider before you start a new exercise program. What are some exercise ideas? Moderate-intensity exercise ideas include:  Walking 1 mile (1.6 km) in about 15 minutes.  Biking.  Hiking.  Golfing.  Dancing.  Water aerobics. Vigorous-intensity exercise ideas include:  Walking 4.5 miles (7.2 km) or more in about 1 hour.  Jogging or running 5 miles (8 km) in about 1 hour.  Biking 10 miles (16.1 km) or more in about 1 hour.  Lap swimming.  Roller-skating or in-line skating.  Cross-country skiing.  Vigorous competitive sports, such as football, basketball, and soccer.  Jumping rope.  Aerobic dancing.   What are some everyday activities that can help me to get exercise?  Yard work, such as: ? Pushing a lawn mower. ? Raking and bagging leaves.  Washing your car.  Pushing a stroller.  Shoveling snow.  Gardening.  Washing windows or floors. How can I be more active in my day-to-day activities?  Use stairs instead of an elevator.  Take a walk during your lunch break.  If you drive, park your car farther away from your work or school.  If you take public transportation, get off one stop early and walk the rest of the way.  Stand up or walk around during all of your indoor phone calls.  Get up, stretch, and walk around every 30 minutes throughout the day.  Enjoy exercise with a friend. Support to continue exercising will help you keep a regular routine of activity. What guidelines   can I follow while exercising?  Before you start a new exercise program, talk with your health care provider.  Do not exercise so much that you hurt yourself, feel dizzy, or get very short of breath.  Wear comfortable clothes and wear shoes with good support.  Drink plenty of  water while you exercise to prevent dehydration or heat stroke.  Work out until your breathing and your heartbeat get faster. Where to find more information  [place] Department of Health and Human Services: www.hhs.gov  Centers for Disease Control and Prevention (CDC): www.cdc.gov Summary  Exercising regularly is important. It will improve your overall fitness, flexibility, and endurance.  Regular exercise also will improve your overall health. It can help you control your weight, reduce stress, and improve your bone density.  Do not exercise so much that you hurt yourself, feel dizzy, or get very short of breath.  Before you start a new exercise program, talk with your health care provider. This information is not intended to replace advice given to you by your health care provider. Make sure you discuss any questions you have with your health care provider. Document Revised: 01/28/2017 Document Reviewed: 01/06/2017 Elsevier Patient Education  2021 Else[employer] Inc.   Budget-Friendly Healthy Eating There are many ways to save money at the grocery store and continue to eat healthy. You can be successful if you:  Plan meals according to your budget.  Make a grocery list and only purchase food according to your grocery list.  Prepare food yourself at home. What are tips for following this plan? Reading food labels  Compare food labels between brand name foods and the store brand. Often the nutritional value is the same, but the store brand is lower cost.  Look for products that do not have added sugar, fat, or salt (sodium). These often cost the same but are healthier for you. Products may be labeled as: ? Sugar-free. ? Nonfat. ? Low-fat. ? Sodium-free. ? Low-sodium.  Look for lean ground beef labeled as at least 92% lean and 8% fat. Shopping  Buy only the items on your grocery list and go only to the areas of the store that have the items on your list.  Use coupons only for  foods and brands you normally buy. Avoid buying items you wouldn't normally buy simply because they are on sale.  Check online and in newspapers for weekly deals.  Buy healthy items from the bulk bins when available, such as herbs, spices, flour, pasta, nuts, and dried fruit.  Buy fruits and vegetables that are in season. Prices are usually lower on in-season produce.  Look at the unit price on the price tag. Use it to compare different brands and sizes to find out which item is the best deal.  Choose healthy items that are often low-cost, such as carrots, potatoes, apples, bananas, and oranges. Dried or canned beans are a low-cost protein source.  Buy in bulk and freeze extra food. Items you can buy in bulk include meats, fish, poultry, frozen fruits, and frozen vegetables.  Avoid buying "ready-to-eat" foods, such as pre-cut fruits and vegetables and pre-made salads.  If possible, shop around to discover where you can find the best prices. Consider other retailers such as dollar stores, larger wholesale stores, local fruit and vegetable stands, and farmers markets.  Do not shop when you are hungry. If you shop while hungry, it may be hard to stick to your list and budget.  Resist impulse buying. Use your grocery   list as your official plan for the week.  Buy a variety of vegetables and fruits by purchasing fresh, frozen, and canned items.  Look at the top and bottom shelves for deals. Foods at eye level (eye level of an adult or child) are usually more expensive.  Be efficient with your time when shopping. The more time you spend at the store, the more money you are likely to spend.  To save money when choosing more expensive foods like meats and dairy: ? Choose cheaper cuts of meat, such as bone-in chicken thighs and drumsticks instead of skinless and boneless chicken. When you are ready to prepare the chicken, you can remove the skin yourself to make it healthier. ? Choose lean meats  like chicken or turkey instead of beef. ? Choose canned seafood, such as tuna, salmon, or sardines. ? Buy eggs as a low-cost source of protein. ? Buy dried beans and peas, such as lentils, split peas, or kidney beans instead of meats. Dried beans and peas are a good alternative source of protein. ? Buy the larger tubs of yogurt instead of individual-sized containers.  Choose water instead of sodas and other sweetened beverages.  Avoid buying chips, cookies, and other "junk food." These items are usually expensive and not healthy.   Cooking  Make extra food and freeze the extras in meal-sized containers or in individual portions for fast meals and snacks.  Pre-cook on days when you have extra time to prepare meals in advance. You can keep these meals in the fridge or freezer and reheat for a quick meal.  When you come home from the grocery store, wash, peel, and cut fruits and vegetables so they are ready to use and eat. This will help reduce food waste. Meal planning  Do not eat out or get fast food. Prepare food at home.  Make a grocery list and make sure to bring it with you to the store. If you have a smart phone, you could use your phone to create your shopping list.  Plan meals and snacks according to a grocery list and budget you create.  Use leftovers in your meal plan for the week.  Look for recipes where you can cook once and make enough food for two meals.  Prepare budget-friendly types of meals like stews, casseroles, and stir-fry dishes.  Try some meatless meals or try "no cook" meals like salads.  Make sure that half your plate is filled with fruits or vegetables. Choose from fresh, frozen, or canned fruits and vegetables. If eating canned, remember to rinse them before eating. This will remove any excess salt added for packaging. Summary  Eating healthy on a budget is possible if you plan your meals according to your budget, purchase according to your budget and  grocery list, and prepare food yourself.  Tips for buying more food on a limited budget include buying generic brands, using coupons only for foods you normally buy, and buying healthy items from the bulk bins when available.  Tips for buying cheaper food to replace expensive food include choosing cheaper, lean cuts of meat, and buying dried beans and peas. This information is not intended to replace advice given to you by your health care provider. Make sure you discuss any questions you have with your health care provider. Document Revised: 11/29/2019 Document Reviewed: 11/29/2019 Elsevier Patient Education  2021 [employer] Inc.   Bone Health Bones protect organs, store calcium, anchor muscles, and support the whole body. Keeping your bones   strong is important, especially as you get older. You can take actions to help keep your bones strong and healthy. Why is keeping my bones healthy important? Keeping your bones healthy is important because your body constantly replaces bone cells. Cells get old, and new cells take their place. As we age, we lose bone cells because the body may not be able to make enough new cells to replace the old cells. The amount of bone cells and bone tissue you have is referred to as bone mass. The higher your bone mass, the stronger your bones. The aging process leads to an overall loss of bone mass in the body, which can increase the likelihood of:  Joint pain and stiffness.  Broken bones.  A condition in which the bones become weak and brittle (osteoporosis). A large decline in bone mass occurs in older adults. In women, it occurs about the time of menopause.   What actions can I take to keep my bones healthy? Good health habits are important for maintaining healthy bones. This includes eating nutritious foods and exercising regularly. To have healthy bones, you need to get enough of the right minerals and vitamins. Most nutrition experts recommend getting these  nutrients from the foods that you eat. In some cases, taking supplements may also be recommended. Doing certain types of exercise is also important for bone health. What are the nutritional recommendations for healthy bones? Eating a well-balanced diet with plenty of calcium and vitamin D will help to protect your bones. Nutritional recommendations vary from person to person. Ask your health care provider what is healthy for you. Here are some general guidelines. Get enough calcium Calcium is the most important (essential) mineral for bone health. Most people can get enough calcium from their diet, but supplements may be recommended for people who are at risk for osteoporosis. Good sources of calcium include:  Dairy products, such as low-fat or nonfat milk, cheese, and yogurt.  Dark green leafy vegetables, such as bok choy and broccoli.  Calcium-fortified foods, such as orange juice, cereal, bread, soy beverages, and tofu products.  Nuts, such as almonds. Follow these recommended amounts for daily calcium intake:  Children, age 1-3: 700 mg.  Children, age 4-8: 1,000 mg.  Children, age 9-13: 1,300 mg.  Teens, age 14-18: 1,300 mg.  Adults, age 19-50: 1,000 mg.  Adults, age 51-70: ? Men: 1,000 mg. ? Women: 1,200 mg.  Adults, age 71 or older: 1,200 mg.  Pregnant and breastfeeding females: ? Teens: 1,300 mg. ? Adults: 1,000 mg. Get enough vitamin D Vitamin D is the most essential vitamin for bone health. It helps the body absorb calcium. Sunlight stimulates the skin to make vitamin D, so be sure to get enough sunlight. If you live in a cold climate or you do not get outside often, your health care provider may recommend that you take vitamin D supplements. Good sources of vitamin D in your diet include:  Egg yolks.  Saltwater fish.  Milk and cereal fortified with vitamin D. Follow these recommended amounts for daily vitamin D intake:  Children and teens, age 1-18: 600  international units.  Adults, age 50 or younger: 400-800 international units.  Adults, age 51 or older: 800-1,000 international units. Get other important nutrients Other nutrients that are important for bone health include:  Phosphorus. This mineral is found in meat, poultry, dairy foods, nuts, and legumes. The recommended daily intake for adult men and adult women is 700 mg.  Magnesium. This mineral   is found in seeds, nuts, dark green vegetables, and legumes. The recommended daily intake for adult men is 400-420 mg. For adult women, it is 310-320 mg.  Vitamin K. This vitamin is found in green leafy vegetables. The recommended daily intake is 120 mg for adult men and 90 mg for adult women.   What type of physical activity is best for building and maintaining healthy bones? Weight-bearing and strength-building activities are important for building and maintaining healthy bones. Weight-bearing activities cause muscles and bones to work against gravity. Strength-building activities increase the strength of the muscles that support bones. Weight-bearing and muscle-building activities include:  Walking and hiking.  Jogging and running.  Dancing.  Gym exercises.  Lifting weights.  Tennis and racquetball.  Climbing stairs.  Aerobics. Adults should get at least 30 minutes of moderate physical activity on most days. Children should get at least 60 minutes of moderate physical activity on most days. Ask your health care provider what type of exercise is best for you.   How can I find out if my bone mass is low? Bone mass can be measured with an X-ray test called a bone mineral density (BMD) test. This test is recommended for all women who are age 65 or older. It may also be recommended for:  Men who are age 70 or older.  People who are at risk for osteoporosis because of: ? Having bones that break easily. ? Having a long-term disease that weakens bones, such as kidney disease or  rheumatoid arthritis. ? Having menopause earlier than normal. ? Taking medicine that weakens bones, such as steroids, thyroid hormones, or hormone treatment for breast cancer or prostate cancer. ? Smoking. ? Drinking three or more alcoholic drinks a day. If you find that you have a low bone mass, you may be able to prevent osteoporosis or further bone loss by changing your diet and lifestyle. Where can I find more information? For more information, check out the following websites:  National Osteoporosis Foundation: www.nof.org/patients  National Institutes of Health: www.bones.nih.gov  International Osteoporosis Foundation: www.iofbonehealth.org Summary  The aging process leads to an overall loss of bone mass in the body, which can increase the likelihood of broken bones and osteoporosis.  Eating a well-balanced diet with plenty of calcium and vitamin D will help to protect your bones.  Weight-bearing and strength-building activities are also important for building and maintaining strong bones.  Bone mass can be measured with an X-ray test called a bone mineral density (BMD) test. This information is not intended to replace advice given to you by your health care provider. Make sure you discuss any questions you have with your health care provider. Document Revised: 03/14/2017 Document Reviewed: 03/14/2017 Elsevier Patient Education  2021 [employer].   

## 2020-04-23 NOTE — Progress Notes (Signed)
Gynecology Annual Exam  PCP: Lurline Del, DO  Chief Complaint:  Chief Complaint  Patient presents with  . Gynecologic Exam    History of Present Illness: Patient is a 25 y.o. G1P1001 presents for annual exam. The patient has no complaints today.   LMP: Patient's last menstrual period was 04/06/2020. Average Interval: monthly Duration of flow: 6 days Heavy Menses: no Intermenstrual Bleeding: no Dysmenorrhea: no  The patient does perform self breast exams.  There is no notable family history of breast or ovarian cancer in her family.  The patient reports her exercise generally consists of working out 5-6 days a week.  .  The patient denies current symptoms of depression.   She reports she has has a reoccurrence of a bothersome skin rash. In the past she was told it was a fungal rash. She would like to see a dermatologist regarding this rash.   Review of Systems: Review of Systems  Constitutional: Negative for chills, fever, malaise/fatigue and weight loss.  HENT: Negative for congestion, hearing loss and sinus pain.   Eyes: Negative for blurred vision and double vision.  Respiratory: Negative for cough, sputum production, shortness of breath and wheezing.   Cardiovascular: Negative for chest pain, palpitations, orthopnea and leg swelling.  Gastrointestinal: Negative for abdominal pain, constipation, diarrhea, nausea and vomiting.  Genitourinary: Negative for dysuria, flank pain, frequency, hematuria and urgency.  Musculoskeletal: Negative for back pain, falls and joint pain.  Skin: Negative for itching and rash.  Neurological: Negative for dizziness and headaches.  Psychiatric/Behavioral: Negative for depression, substance abuse and suicidal ideas. The patient is not nervous/anxious.     Past Medical History:  Past Medical History:  Diagnosis Date  . Asthma     Past Surgical History:  Past Surgical History:  Procedure Laterality Date  . CYSTOSCOPY N/A 05/15/2019    Procedure: CYSTOSCOPY;  Surgeon: Homero Fellers, MD;  Location: [facility] ORS;  Service: Gynecology;  Laterality: N/A;  . LAPAROSCOPY  05/15/2019   Procedure: LAPAROSCOPY DIAGNOSTIC WITH RIGHT OOPHORECTOMY AND LEFT OVARIAN CYSTECTOMY;  Surgeon: Homero Fellers, MD;  Location: [facility] ORS;  Service: Gynecology;;  . WISDOM TOOTH EXTRACTION      Gynecologic History:  Patient's last menstrual period was 04/06/2020. Menarche: 14  History of fibroids, polyps, or ovarian cysts? : history of dermoid, removed  History of PCOS? no Hstory of Endometriosis? no History of abnormal pap smears? no Have you had any sexually transmitted infections in the past? no  She has received HPV vaccination in the past.    Last Pap: Results were: 2019 NIL    She identifies as a female. She is sexually active with men.   She denies dyspareunia. She denies postcoital bleeding.  She currently uses none for contraception.    Obstetric History: G1P1001  Family History:  Family History  Problem Relation Age of Onset  . Hyperlipidemia Mother   . Anxiety disorder Other        Grandmother and mother  . Depression Other        Grandmother and mother  . Hyperlipidemia Other   . Diabetes type II Other        [place] parents  . Other Other        VSD repaired in grandmother age 44    Social History:  Social History   Socioeconomic History  . Marital status: Single    Spouse name: Not on file  . Number of children: Not on file  . Years of  education: Not on file  . Highest education level: Not on file  Occupational History  . Occupation: Ship broker  Tobacco Use  . Smoking status: Never Smoker  . Smokeless tobacco: Never Used  Vaping Use  . Vaping Use: Never used  Substance and Sexual Activity  . Alcohol use: No  . Drug use: Never  . Sexual activity: Yes    Birth control/protection: None  Other Topics Concern  . Not on file  Social History Narrative   Currently attending college.    Feels  safe in relationship of 2 years.   Social Determinants of Health   Financial Resource Strain: Not on file  Food Insecurity: Not on file  Transportation Needs: Not on file  Physical Activity: Not on file  Stress: Not on file  Social Connections: Not on file  Intimate Partner Violence: Not on file    Allergies:  Allergies  Allergen Reactions  . Vancomycin Itching and Other (See Comments)    Red man syndrome  . Penicillins Rash    Has patient had a PCN reaction causing immediate rash, facial/tongue/throat swelling, SOB or lightheadedness with hypotension: Unknown Has patient had a PCN reaction causing severe rash involving mucus membranes or skin necrosis: Unknown Has patient had a PCN reaction that required hospitalization: Unknown Has patient had a PCN reaction occurring within the last 10 years: no If all of the above answers are "NO", then may proceed with Cephalosporin use.     Medications: Prior to Admission medications   Medication Sig Start Date End Date Taking? Authorizing Provider  acetaminophen (TYLENOL) 500 MG tablet Take 2 tablets (1,000 mg total) by mouth every 6 (six) hours as needed. Patient not taking: Reported on 06/05/2019 05/15/19 05/14/20  Homero Fellers, MD  ibuprofen (ADVIL) 600 MG tablet Take 1 tablet (600 mg total) by mouth every 6 (six) hours as needed. Patient not taking: Reported on 06/05/2019 05/15/19   Homero Fellers, MD  oxyCODONE (ROXICODONE) 5 MG immediate release tablet Take 1 tablet (5 mg total) by mouth every 6 (six) hours as needed for severe pain. Patient not taking: Reported on 06/05/2019 05/15/19 05/14/20  Homero Fellers, MD    Physical Exam Vitals: Blood pressure 120/72, height 5\' 4"  (1.626 m), weight 215 lb 6.4 oz (97.7 kg), last menstrual period 04/06/2020.  Physical Exam Constitutional:      Appearance: She is well-developed.  Genitourinary:     Genitourinary Comments: External: Normal appearing vulva. No lesions noted.   Speculum examination: Normal appearing cervix. No blood in the vaginal vault. no discharge.   Bimanual examination: Uterus midline, non-tender, normal in size, shape and contour.  No CMT. No adnexal masses. No adnexal tenderness. Pelvis not fixed.   Breasts:     Right: No inverted nipple, mass, nipple discharge, skin change, tenderness, axillary adenopathy or supraclavicular adenopathy.     Left: No inverted nipple, mass, nipple discharge, skin change, tenderness, axillary adenopathy or supraclavicular adenopathy.    HENT:     Head: Normocephalic and atraumatic.  Neck:     Thyroid: No thyromegaly.  Cardiovascular:     Rate and Rhythm: Normal rate and regular rhythm.     Heart sounds: Normal heart sounds.  Pulmonary:     Effort: Pulmonary effort is normal.     Breath sounds: Normal breath sounds.  Abdominal:     General: Bowel sounds are normal. There is no distension.     Palpations: Abdomen is soft. There is no mass.  Musculoskeletal:  Cervical back: Neck supple.  Lymphadenopathy:     Upper Body:     Right upper body: No supraclavicular or axillary adenopathy.     Left upper body: No supraclavicular or axillary adenopathy.  Neurological:     Mental Status: She is alert and oriented to person, place, and time.  Skin:    General: Skin is warm and dry.  Psychiatric:        Behavior: Behavior normal.        Thought Content: Thought content normal.        Judgment: Judgment normal.  Vitals reviewed. Exam conducted with a chaperone present.     Female chaperone present for pelvic and breast  portions of the physical exam  Assessment: 25 y.o. G1P1001 routine annual exam  Plan: Problem List Items Addressed This Visit   None   Visit Diagnoses    Health maintenance examination    -  Primary   Encounter for annual routine gynecological examination       Cervical cancer screening       Relevant Orders   Cytology - PAP   Screen for STD (sexually transmitted disease)        Relevant Orders   Cytology - PAP   Skin rash       Relevant Orders   Ambulatory referral to Dermatology      1) STI screening was offered and accepted  2) ASCCP guidelines and rational discussed.  Patient opts for every 5 years screening interval  3) Contraception - none desired, would like to obtain pregnancy. Encouraged to start a prenatal vitamin.  4) Routine healthcare maintenance including cholesterol, diabetes screening discussed managed by PCP  5) Skin rash- referral placed to dermatology.   Adrian Prows MD, [facility], [facility] 04/23/2020 11:13 AM

## 2020-04-28 LAB — CYTOLOGY - PAP
Chlamydia: NEGATIVE
Comment: NEGATIVE
Comment: NEGATIVE
Comment: NORMAL
Diagnosis: NEGATIVE
Neisseria Gonorrhea: NEGATIVE
Trichomonas: NEGATIVE

## 2020-04-30 ENCOUNTER — Ambulatory Visit: Payer: Medicaid Other | Admitting: Dermatology

## 2020-05-28 ENCOUNTER — Encounter: Payer: Self-pay | Admitting: Family Medicine

## 2020-05-28 NOTE — Telephone Encounter (Signed)
Called patient to attempt to schedule appointment. LVM for patient to return call to office to schedule appointment.   Talbot Grumbling, RN

## 2020-06-08 NOTE — Patient Instructions (Incomplete)
It was great to see you! Thank you for allowing me to participate in your care!  I recommend that you always bring your medications to each appointment as this makes it easy to ensure we are on the correct medications and helps [place] not miss when refills are needed.  Our plans for today:  - *** -   We are checking some labs today, I will call you if they are abnormal will send you a MyChart message or a letter if they are normal.  If you do not hear about your labs in the next 2 weeks please let us know.***  Take care and seek immediate care sooner if you develop any concerns.   Dr. Lurline Del, [place]

## 2020-06-08 NOTE — Progress Notes (Deleted)
    SUBJECTIVE:   CHIEF COMPLAINT / HPI:   Concern for hernia: Patient is 25 year old female presents today for concern of a hernia which she states occurred after weight lifting.  Patient states***.  PERTINENT  PMH / PSH: ***  OBJECTIVE:   There were no vitals taken for this visit.   General: NAD, pleasant, able to participate in exam Cardiac: RRR, no murmurs. Respiratory: CTAB, normal effort, No wheezes, rales or rhonchi Abdomen: Bowel sounds present, nontender, nondistended, no hepatosplenomegaly. Skin: warm and dry, no rashes noted Neuro: alert, no obvious focal deficits Psych: Normal affect and mood  ASSESSMENT/PLAN:   No problem-specific Assessment & Plan notes found for this encounter.     Lurline Del, [place]    This note was prepared using Dragon voice recognition software and may include unintentional dictation errors due to the inherent limitations of voice recognition software.  {    This will disappear when note is signed, click to select method of visit    :1}

## 2020-06-09 ENCOUNTER — Ambulatory Visit: Payer: Medicaid Other | Admitting: Family Medicine

## 2020-07-02 NOTE — Progress Notes (Deleted)
    SUBJECTIVE:   CHIEF COMPLAINT / HPI:   Abdominal pain: Patient is a 25 year old female who presents today to discuss abdominal pain.  PERTINENT  PMH / PSH: ***  OBJECTIVE:   There were no vitals taken for this visit.   General: NAD, pleasant, able to participate in exam Cardiac: RRR, no murmurs. Respiratory: CTAB, normal effort, No wheezes, rales or rhonchi Abdomen: Bowel sounds present, nontender, nondistended, no hepatosplenomegaly. Extremities: no edema or cyanosis. Skin: warm and dry, no rashes noted Neuro: alert, no obvious focal deficits Psych: Normal affect and mood  ASSESSMENT/PLAN:   No problem-specific Assessment & Plan notes found for this encounter.     Lurline Del, [place]    This note was prepared using Dragon voice recognition software and may include unintentional dictation errors due to the inherent limitations of voice recognition software.  {    This will disappear when note is signed, click to select method of visit    :1}

## 2020-07-02 NOTE — Patient Instructions (Incomplete)
It was great to see you! Thank you for allowing me to participate in your care!  I recommend that you always bring your medications to each appointment as this makes it easy to ensure we are on the correct medications and helps [place] not miss when refills are needed.  Our plans for today:  - *** -   We are checking some labs today, I will call you if they are abnormal will send you a MyChart message or a letter if they are normal.  If you do not hear about your labs in the next 2 weeks please let us know.***  Take care and seek immediate care sooner if you develop any concerns.   Dr. Lurline Del, [place]

## 2020-07-03 ENCOUNTER — Ambulatory Visit: Payer: Medicaid Other | Admitting: Family Medicine

## 2020-07-08 NOTE — Patient Instructions (Signed)
It was great to see you! Thank you for allowing me to participate in your care!  I recommend that you always bring your medications to each appointment as this makes it easy to ensure we are on the correct medications and helps [place] not miss when refills are needed.  Our plans for today:  -For your abdominal pain I believe this is likely superficial nerve pain.  It should not harm anything to continue exercising as you have been doing.  If you develop any worsening symptoms, worsening pain, or deeper pain please let us know. -As far as trying to conceive I recommend that she try to have intercourse with your partner at least 2-3 times per week in the 3 weeks around the time where you might be able to conceive.  This would give you the best chance to conceive.  Obviously more bouts of intercourse to increase the possibility of a pregnancy so trying to increase this is much as possible will increase the likelihood of pregnancy.  I would try this for the next 4 to 6 months and let us know if you have not had any success in that time. -I do recommend that she take a prenatal vitamin each and every day if you are not doing so.  Take care and seek immediate care sooner if you develop any concerns.   Dr. Lurline Del, [place]

## 2020-07-08 NOTE — Progress Notes (Signed)
SUBJECTIVE:   CHIEF COMPLAINT / HPI:   Abdominal pain: Patient is a 25 year old female who presents today to discuss abdominal pain. She states this started since having ovarian surgery for multiple cysts about a year ago. She states she had her post-op appointment and had a good report and so went back to her normal weight lifting and exercise. She states since then she has had some pain, particularly when on her menses. The pain seems to occur around the region of the umbilicus. It seems shallow, no deeper than about 1/2 inch below the surface of the skin in the region around her scar from the laparoscopic surgery.  Wants to get pregnant She is currently going to OB/gyn but states that for the past 8 months or so her partner and her have tried to get pregnant but without success. She has had irregular periods. Her and her partner have tried to have intercourse 1-2x a week in the week or two around the time she things she could conceive.   PERTINENT  PMH / PSH: Ovary removed from cysts.   OBJECTIVE:   BP 120/82   Pulse 67   Ht 5\' 4"  (1.626 m)   Wt 218 lb (98.9 kg)   LMP 06/19/2020   SpO2 97%   BMI 37.42 kg/m    General: NAD, pleasant, able to participate in exam Cardiac: RRR, no murmurs. Respiratory: CTAB, normal effort, No wheezes, rales or rhonchi Abdomen: Bowel sounds present, nontender, nondistended, well-healed surgical scars present, no superficial or deep pain to palpation. Psych: Normal affect and mood  ASSESSMENT/PLAN:   Attempting to conceive Assessment: 25 year old female who presents today to discuss the fact that she been trying to conceive for the past 8 months or so.  She has had a recent surgery of one of her ovaries removed due to multiple cysts.  She has not seen her OB/GYN and has had no other obvious concerns.  She and her current partner have had a child in the past.  She states that she has been trying to time her cycles and due to their busy work schedule  they have been limited in the amount of time for intercourse, however they have tried to have intercourse 1-2 times a week during the week or 2 around the time she think she can be able to conceive. Plan: -I recommended that she increase the frequency of intercourse around the 2 or 3 weeks surrounding the time where she thinks she would be most likely to conceive.  I recommended trying this for the next several months and then follow-up if she is unsuccessful -I recommend that she continue with the daily prenatal vitamin   Abdominal pain: 24 year old female with superficial nerve type abdominal pain in the region of a well-healed scar from her laparoscopic surgery to have her ovary removed.  On physical exam she has no pain to light or deep touch and she mostly feels this discomfort when performing regular exercise.  She states that she feels this pain superficially and it does not seem to extend into deeper than about half inch below the surface.  Most likely etiology is damage superficial nerves from the previous surgery.  I provided reassurance to the patient and she will let us know if anything changes with her symptoms in the future.   Lurline Del, [place]    This note was prepared using Dragon voice recognition software and may include unintentional dictation errors due to the  inherent limitations of voice recognition software.

## 2020-07-10 ENCOUNTER — Encounter: Payer: Self-pay | Admitting: Family Medicine

## 2020-07-10 ENCOUNTER — Other Ambulatory Visit: Payer: Self-pay

## 2020-07-10 ENCOUNTER — Ambulatory Visit (INDEPENDENT_AMBULATORY_CARE_PROVIDER_SITE_OTHER): Payer: Medicaid Other | Admitting: Family Medicine

## 2020-07-10 VITALS — BP 120/82 | HR 67 | Ht 64.0 in | Wt 218.0 lb

## 2020-07-10 DIAGNOSIS — Z789 Other specified health status: Secondary | ICD-10-CM | POA: Diagnosis not present

## 2020-07-10 DIAGNOSIS — Z3200 Encounter for pregnancy test, result unknown: Secondary | ICD-10-CM

## 2020-07-10 LAB — POCT URINE PREGNANCY: Preg Test, Ur: NEGATIVE

## 2020-07-10 NOTE — Assessment & Plan Note (Signed)
Assessment: 25 year old female who presents today to discuss the fact that she been trying to conceive for the past 8 months or so.  She has had a recent surgery of one of her ovaries removed due to multiple cysts.  She has not seen her OB/GYN and has had no other obvious concerns.  She and her current partner have had a child in the past.  She states that she has been trying to time her cycles and due to their busy work schedule they have been limited in the amount of time for intercourse, however they have tried to have intercourse 1-2 times a week during the week or 2 around the time she think she can be able to conceive. Plan: -I recommended that she increase the frequency of intercourse around the 2 or 3 weeks surrounding the time where she thinks she would be most likely to conceive.  I recommended trying this for the next several months and then follow-up if she is unsuccessful -I recommend that she continue with the daily prenatal vitamin

## 2020-08-18 ENCOUNTER — Encounter: Payer: Self-pay | Admitting: Obstetrics and Gynecology

## 2020-08-18 ENCOUNTER — Other Ambulatory Visit: Payer: Self-pay

## 2020-08-18 ENCOUNTER — Other Ambulatory Visit: Payer: Self-pay | Admitting: Obstetrics and Gynecology

## 2020-08-18 ENCOUNTER — Ambulatory Visit (INDEPENDENT_AMBULATORY_CARE_PROVIDER_SITE_OTHER): Payer: Medicaid Other | Admitting: Obstetrics and Gynecology

## 2020-08-18 ENCOUNTER — Other Ambulatory Visit (HOSPITAL_COMMUNITY)
Admission: RE | Admit: 2020-08-18 | Discharge: 2020-08-18 | Disposition: A | Discharge status: Home / Self Care | Payer: Medicaid Other | Source: Ambulatory Visit | Attending: Obstetrics and Gynecology | Admitting: Obstetrics and Gynecology

## 2020-08-18 VITALS — BP 120/72 | Ht 64.0 in | Wt 219.6 lb

## 2020-08-18 DIAGNOSIS — Z3481 Encounter for supervision of other normal pregnancy, first trimester: Secondary | ICD-10-CM | POA: Insufficient documentation

## 2020-08-18 DIAGNOSIS — O219 Vomiting of pregnancy, unspecified: Secondary | ICD-10-CM

## 2020-08-18 DIAGNOSIS — Z3A01 Less than 8 weeks gestation of pregnancy: Secondary | ICD-10-CM | POA: Diagnosis not present

## 2020-08-18 DIAGNOSIS — O99210 Obesity complicating pregnancy, unspecified trimester: Secondary | ICD-10-CM | POA: Diagnosis not present

## 2020-08-18 DIAGNOSIS — Z3490 Encounter for supervision of normal pregnancy, unspecified, unspecified trimester: Secondary | ICD-10-CM | POA: Insufficient documentation

## 2020-08-18 DIAGNOSIS — N926 Irregular menstruation, unspecified: Secondary | ICD-10-CM

## 2020-08-18 LAB — POCT URINALYSIS DIPSTICK OB
Glucose, UA: NEGATIVE
POC,PROTEIN,UA: NEGATIVE

## 2020-08-18 LAB — POCT URINE PREGNANCY: Preg Test, Ur: POSITIVE — AB

## 2020-08-18 MED ORDER — ONDANSETRON 4 MG PO TBDP: 4.0000 mg | ORAL_TABLET | Freq: Four times a day (QID) | ORAL | 3 refills | Status: DC | PRN

## 2020-08-18 MED ORDER — PROMETHAZINE HCL 25 MG PO TABS: 25.0000 mg | ORAL_TABLET | Freq: Four times a day (QID) | ORAL | 3 refills | Status: DC | PRN

## 2020-08-18 NOTE — Patient Instructions (Signed)
Initial steps to help :   B6 (pyridoxine) 25 mg,  3-4 times a day- 200 mg a day total Unisom (doxylamine) 25 mg at bedtime **B6 and Unisom are available as a combination prescription medications called diclegis and bonjesta  B1 (thiamin)  50-100 mg 1-2 a day-  100 mg a day total  Continue prenatal vitamin with iron and thiamin. If it is not tolerated switch to 1 mg of folic acid.  Can add medication for gastric reflux if needed.  Subsequent steps to be added to B1, B6, and Unisom:  Antihistamine (one of the following medications) Dramamine      25-50 mg every 4-6 hours Benadryl      25-50 mg every 4-6 hours Meclizine      25 mg every 6 hours  2. Dopamine Antagonist (one of the following medications) Metoclopramide  (Reglan)  5-10 mg every 6-8 hours         PO Promethazine   (Phenergan)   12.5-25 mg every 4-6 hours      PO or rectal Prochlorperazine  (Compazine)  5-10 mg every 6-8 hours     25mg  BID rectally   Subsequent steps if there has still not been improvement in symptoms:  3. Daily stool softner:  Colace 100 mg twice a day  4. Ondansetron  (Zofran)   4-8 mg every 6-8 hours     First Trimester of Pregnancy  The first trimester of pregnancy starts on the first day of your last menstrual period until the end of week 12. This is months 1 through 3 of pregnancy. A week after a sperm fertilizes an egg, the egg will implant into the wall of the uterus and begin to develop into a baby. By the end of 12 weeks, all the baby'sorgans will be formed and the baby will be 2-3 inches in size. Body changes during your first trimester Your body goes through many changes during pregnancy. The changes vary andgenerally return to normal after your baby is born. Physical changes You may gain or lose weight. Your breasts may begin to grow larger and become tender. The tissue that surrounds your nipples (areola) may become darker. Dark spots or blotches (chloasma or mask of pregnancy) may develop  on your face. You may have changes in your hair. These can include thickening or thinning of your hair or changes in texture. Health changes You may feel nauseous, and you may vomit. You may have heartburn. You may develop headaches. You may develop constipation. Your gums may bleed and may be sensitive to brushing and flossing. Other changes You may tire easily. You may urinate more often. Your menstrual periods will stop. You may have a loss of appetite. You may develop cravings for certain kinds of food. You may have changes in your emotions from day to day. You may have more vivid and strange dreams. Follow these instructions at home: Medicines Follow your health care provider's instructions regarding medicine use. Specific medicines may be either safe or unsafe to take during pregnancy. Do not take any medicines unless told to by your health care provider. Take a prenatal vitamin that contains at least 600 micrograms (mcg) of folic acid. Eating and drinking Eat a healthy diet that includes fresh fruits and vegetables, whole grains, good sources of protein such as meat, eggs, or tofu, and low-fat dairy products. Avoid raw meat and unpasteurized juice, milk, and cheese. These carry germs that can harm you and your baby. If you feel nauseous or  you vomit: Eat 4 or 5 small meals a day instead of 3 large meals. Try eating a few soda crackers. Drink liquids between meals instead of during meals. You may need to take these actions to prevent or treat constipation: Drink enough fluid to keep your urine pale yellow. Eat foods that are high in fiber, such as beans, whole grains, and fresh fruits and vegetables. Limit foods that are high in fat and processed sugars, such as fried or sweet foods. Activity Exercise only as directed by your health care provider. Most people can continue their usual exercise routine during pregnancy. Try to exercise for 30 minutes at least 5 days a week. Stop  exercising if you develop pain or cramping in the lower abdomen or lower back. Avoid exercising if it is very hot or humid or if you are at high altitude. Avoid heavy lifting. If you choose to, you may have sex unless your health care provider tells you not to. Relieving pain and discomfort Wear a good support bra to relieve breast tenderness. Rest with your legs elevated if you have leg cramps or low back pain. If you develop bulging veins (varicose veins) in your legs: Wear support hose as told by your health care provider. Elevate your feet for 15 minutes, 3-4 times a day. Limit salt in your diet. Safety Wear your seat belt at all times when driving or riding in a car. Talk with your health care provider if someone is verbally or physically abusive to you. Talk with your health care provider if you are feeling sad or have thoughts of hurting yourself. Lifestyle Do not use hot tubs, steam rooms, or saunas. Do not douche. Do not use tampons or scented sanitary pads. Do not use herbal remedies, alcohol, illegal drugs, or medicines that are not approved by your health care provider. Chemicals in these products can harm your baby. Do not use any products that contain nicotine or tobacco, such as cigarettes, e-cigarettes, and chewing tobacco. If you need help quitting, ask your health care provider. Avoid cat litter boxes and soil used by cats. These carry germs that can cause birth defects in the baby and possibly loss of the unborn baby (fetus) by miscarriage or stillbirth. General instructions During routine prenatal visits in the first trimester, your health care provider will do a physical exam, perform necessary tests, and ask you how things are going. Keep all follow-up visits. This is important. Ask for help if you have counseling or nutritional needs during pregnancy. Your health care provider can offer advice or refer you to specialists for help with various needs. Schedule a dentist  appointment. At home, brush your teeth with a soft toothbrush. Floss gently. Write down your questions. Take them to your prenatal visits. Where to find more information American Pregnancy Association: americanpregnancy.[place] and Gynecologists: PoolDevices.com.pt Office on [employer]: KeywordPortfolios.com.br Contact a health care provider if you have: Dizziness. A fever. Mild pelvic cramps, pelvic pressure, or nagging pain in the abdominal area. Nausea, vomiting, or diarrhea that lasts for 24 hours or longer. A bad-smelling vaginal discharge. Pain when you urinate. Known exposure to a contagious illness, such as chickenpox, measles, Zika virus, HIV, or hepatitis. Get help right away if you have: Spotting or bleeding from your vagina. Severe abdominal cramping or pain. Shortness of breath or chest pain. Any kind of trauma, such as from a fall or a car crash. New or increased pain, swelling, or redness in an arm or  leg. SummaryHyperemesis Gravidarum Hyperemesis gravidarum is a severe form of nausea and vomiting that happens during pregnancy. Hyperemesis is worse than morning sickness. It may cause you to have nausea or vomiting all day for many days. It may keep you from eating and drinking enough food and liquids, which can lead to dehydration, malnutrition, and weight loss. Hyperemesis usually occurs during the first half (the first 20 weeks) of pregnancy. It often goes away once a woman is in her second half of pregnancy. However, sometimes hyperemesis continues through anentire pregnancy. What are the causes? The cause of this condition is not known. It may be associated with: Changes in hormones in the body during pregnancy. Changes in the gastrointestinal system. Genetic or inherited conditions. What are the signs or symptoms? Symptoms of this condition include: Severe nausea and vomiting that does not go away. Problems  keeping food down. Weight loss. Loss of body fluid (dehydration). Loss of appetite. You may have no desire to eat or you may not like the food you have previously enjoyed. How is this diagnosed? This condition may be diagnosed based on your medical history, your symptoms,and a physical exam. You may also have other tests, including: Blood tests. Urine tests. Blood pressure tests. Ultrasound to look for problems with the placenta or to check if you are pregnant with more than one baby. How is this treated? This condition is managed by controlling symptoms. This may include: Following an eating plan. This can help to lessen nausea and vomiting. Treatments that do not use medicine. These include acupressure bracelets, hypnosis, and eating or drinking foods or fluids that contain ginger, ginger ale, or ginger tea. Taking prescription medicine or over-the-counter medicine as told by your health care provider. Continuing to take prenatal vitamins. You may need to change what kind you take and when you take them. Follow your health care provider's instructions about prenatal vitamins. An eating plan and medicines are often used together to help control symptoms. If medicines do not help relieve nausea and vomiting, you may need to receivefluids through an IV at the hospital. Follow these instructions at home: To help relieve your symptoms, listen to your body. Everyone is different and has different preferences. Find what works best for you. Here are some thingsyou can try to help relieve your symptoms: Meals and snacks  Eat 5-6 small meals daily instead of 3 large meals. Eating small meals and snacks can help you avoid an empty stomach. Before getting out of bed, eat a couple of crackers to avoid moving around on an empty stomach. Eat a protein-rich snack before bed. Examples include cheese and crackers, or a peanut butter sandwich made with 1 slice of whole-wheat bread and 1 tsp (5 g) of peanut  butter. Eat and drink slowly. Try eating starchy foods as these are usually tolerated well. Examples include cereal, toast, bread, potatoes, pasta, rice, and pretzels. Eat at least one serving of protein with your meals and snacks. Protein options include lean meats, poultry, seafood, beans, nuts, nut butters, eggs, cheese, and yogurt. Eat or suck on things that have ginger in them. It may help to relieve nausea. Add  tsp (0.44 g) ground ginger to hot tea, or choose ginger tea.  Fluids It is important to stay hydrated. Try to: Drink small amounts of fluids often. Drink fluids 30 minutes before or after a meal to help lessen the feeling of a full stomach. Drink 100% fruit juice or an electrolyte drink. An electrolyte drink contains sodium,  potassium, and chloride. Drink fluids that are cold, clear, and carbonated or sour. These include lemonade, ginger ale, lemon-lime soda, ice water, and sparkling water. Things to avoid Avoid the following: Eating foods that trigger your symptoms. These may include spicy foods, coffee, high-fat foods, very sweet foods, and acidic foods. Drinking more than 1 cup of fluid at a time. Skipping meals. Nausea can be more intense on an empty stomach. If you cannot tolerate food, do not force it. Try sucking on ice chips or other frozen items and make up for missed calories later. Lying down within 2 hours after eating. Being exposed to environmental triggers. These may include food smells, smoky rooms, closed spaces, rooms with strong smells, warm or humid places, overly loud and noisy rooms, and rooms with motion or flickering lights. Try eating meals in a well-ventilated area that is free of strong smells. Making quick and sudden changes in your movement. Taking iron pills and multivitamins that contain iron. If you take prescription iron pills, do not stop taking them unless your health care provider approves. Preparing food. The smell of food can spoil your  appetite or trigger nausea. General instructions Brush your teeth or use a mouth rinse after meals. Take over-the-counter and prescription medicines only as told by your health care provider. Follow instructions from your health care provider about eating or drinking restrictions. Talk with your health care provider about starting a supplement of vitamin B6. Continue to take your prenatal vitamins as told by your health care provider. If you are having trouble taking your prenatal vitamins, talk with your health care provider about other options. Keep all follow-up visits. This is important. Follow-up visits include prenatal visits. Contact a health care provider if: You have pain in your abdomen. You have a severe headache. You have vision problems. You are losing weight. You feel weak or dizzy. You cannot eat or drink without vomiting, especially if this goes on for a full day. Get help right away if: You cannot drink fluids without vomiting. You vomit blood. You have constant nausea and vomiting. You are very weak. You faint. You have a fever and your symptoms suddenly get worse. Summary Hyperemesis gravidarum is a severe form of nausea and vomiting that happens during pregnancy. Making some changes to your eating habits may help relieve nausea and vomiting. This condition may be managed with lifestyle changes and medicines as prescribed by your health care provider. If medicines do not help relieve nausea and vomiting, you may need to receive fluids through an IV at the hospital. This information is not intended to replace advice given to you by your health care provider. Make sure you discuss any questions you have with your healthcare provider. Document Revised: 09/10/2019 Document Reviewed: 09/10/2019 Elsevier Patient Education  2022 [employer].  The first trimester of pregnancy starts on the first day of your last menstrual period until the end of week 12 (months 1 through  3). Eating 4 or 5 small meals a day rather than 3 large meals may help to relieve nausea and vomiting. Do not use any products that contain nicotine or tobacco, such as cigarettes, e-cigarettes, and chewing tobacco. If you need help quitting, ask your health care provider. Keep all follow-up visits. This is important. This information is not intended to replace advice given to you by your health care provider. Make sure you discuss any questions you have with your healthcare provider. Document Revised: 07/25/2019 Document Reviewed: 05/31/2019 Elsevier Patient Education  2022  [employer].

## 2020-08-18 NOTE — Progress Notes (Signed)
08/18/2020   Chief Complaint: Missed period  Transfer of Care Patient: no  History of Present Illness: Jody Mooney is a 25 y.o. G2P1001 [redacted]w[redacted]d based on Patient's last menstrual period was 06/19/2020. with an Estimated Date of Delivery: 03/26/21, with the above CC.   Her periods were: regular periods every month. She was using no method when she conceived.  She has Positive signs or symptoms of nausea/vomiting of pregnancy. She has Negative signs or symptoms of miscarriage or preterm labor She was not taking different medications around the time she conceived/early pregnancy. Since her LMP, she has not used alcohol Since her LMP, she has not used tobacco products Since her LMP, she has not used illegal drugs.    Current or past history of domestic violence. no  Infection History:  1. Since her LMP, she has not had a viral illness.  2. She admits to close contact with children on a regular basis     3. She denies a history of chicken pox. She reports vaccination for chicken pox in the past. 4. Patient or partner has history of genital herpes  no 5. History of STI (GC, CT, HPV, syphilis, HIV)  no   6.  She does not live with someone with TB or TB exposed. 7. History of recent travel :  no 8. She identifies Negative Zika risk factors for her and her partner 80. There are cats in the home in the home.  She understands that while pregnant she should not change cat litter.   Genetic Screening Questions: (Includes patient, baby's father, or anyone in either family)   1. Patient's age >/= 20 at [facility]  no 2. Thalassemia ([place], [place], [place], or Asian background): MCV<80  yno 3. Neural tube defect (meningomyelocele, spina bifida, anencephaly)  no 4. Congenital heart defect  no  5. Down syndrome no 6. Tay-Sachs (Jewish, [place])  no 7. Canavan's Disease  no 8. Sickle cell disease or trait (African)  no  9. Hemophilia or other blood disorders  no  10. Muscular dystrophy  no   11. Cystic fibrosis  no  12. Huntington's Chorea  no  13. Mental retardation/autism  no 14. Other inherited genetic or chromosomal disorder  no 15. Maternal metabolic disorder (DM, PKU, etc)  no 16. Patient or FOB with a child with a birth defect not listed above no  16a. Patient or FOB with a birth defect themselves no 17. Recurrent pregnancy loss, or stillbirth  no  18. Any medications since LMP other than prenatal vitamins (include vitamins, supplements, OTC meds, drugs, alcohol)  no 19. Any other genetic/environmental exposure to discuss  no  ROS:  Review of Systems  Constitutional:  Negative for chills, fever, malaise/fatigue and weight loss.  HENT:  Negative for congestion, hearing loss and sinus pain.   Eyes:  Negative for blurred vision and double vision.  Respiratory:  Negative for cough, sputum production, shortness of breath and wheezing.   Cardiovascular:  Negative for chest pain, palpitations, orthopnea and leg swelling.  Gastrointestinal:  Positive for nausea and vomiting. Negative for abdominal pain, constipation and diarrhea.  Genitourinary:  Negative for dysuria, flank pain, frequency, hematuria and urgency.  Musculoskeletal:  Negative for back pain, falls and joint pain.  Skin:  Negative for itching and rash.  Neurological:  Negative for dizziness and headaches.  Psychiatric/Behavioral:  Negative for depression, substance abuse and suicidal ideas. The patient is not nervous/anxious.    OBGYN History: As per HPI. OB History  [place]  Para Term Preterm AB Living  2 1 1     1   SAB IAB Ectopic Multiple Live Births        0 1    # Outcome Date GA Lbr Len/2nd Weight Sex Delivery Anes PTL Lv  2 Current           1 Term 03/04/18 [redacted]w[redacted]d 07:26 / 00:12 6 lb 9.6 oz (2.994 kg) F Vag-Spont None  LIV    Any issues with any prior pregnancies: no Any prior children are healthy, doing well, without any problems or issues: yes Last pap smear 2022 nil History of STIs: No    Past Medical History: Past Medical History:  Diagnosis Date   Asthma     Past Surgical History: Past Surgical History:  Procedure Laterality Date   CYSTOSCOPY N/A 05/15/2019   Procedure: CYSTOSCOPY;  Surgeon: Homero Fellers, MD;  Location: [facility] ORS;  Service: Gynecology;  Laterality: N/A;   LAPAROSCOPY  05/15/2019   Procedure: LAPAROSCOPY DIAGNOSTIC WITH RIGHT OOPHORECTOMY AND LEFT OVARIAN CYSTECTOMY;  Surgeon: Homero Fellers, MD;  Location: [facility] ORS;  Service: Gynecology;;   WISDOM TOOTH EXTRACTION      Family History:  Family History  Problem Relation Age of Onset   Hyperlipidemia Mother    Anxiety disorder Other        Grandmother and mother   Depression Other        Grandmother and mother   Hyperlipidemia Other    Diabetes type II Other        Grand parents   Other Other        VSD repaired in grandmother age 51   She denies any female cancers, bleeding or blood clotting disorders.   Social History:  Social History   Socioeconomic History   Marital status: Single    Spouse name: Not on file   Number of children: Not on file   Years of education: Not on file   Highest education level: Not on file  Occupational History   Occupation: student  Tobacco Use   Smoking status: Never   Smokeless tobacco: Never  Vaping Use   Vaping Use: Never used  Substance and Sexual Activity   Alcohol use: No   Drug use: Never   Sexual activity: Yes    Birth control/protection: None  Other Topics Concern   Not on file  Social History Narrative   Currently attending college.    Feels safe in relationship of 2 years.   Social Determinants of Health   Financial Resource Strain: Not on file  Food Insecurity: Not on file  Transportation Needs: Not on file  Physical Activity: Not on file  Stress: Not on file  Social Connections: Not on file  Intimate Partner Violence: Not on file    Allergy: Allergies  Allergen Reactions   Vancomycin Itching and Other (See  Comments)    Red man syndrome   Penicillins Rash    Has patient had a PCN reaction causing immediate rash, facial/tongue/throat swelling, SOB or lightheadedness with hypotension: Unknown Has patient had a PCN reaction causing severe rash involving mucus membranes or skin necrosis: Unknown Has patient had a PCN reaction that required hospitalization: Unknown Has patient had a PCN reaction occurring within the last 10 years: no If all of the above answers are "NO", then may proceed with Cephalosporin use.     Current Outpatient Medications:  Current Outpatient Medications:    ondansetron (ZOFRAN ODT) 4 MG disintegrating tablet, Take 1  tablet (4 mg total) by mouth every 6 (six) hours as needed for nausea., Disp: 120 tablet, Rfl: 3   promethazine (PHENERGAN) 25 MG tablet, Take 1 tablet (25 mg total) by mouth every 6 (six) hours as needed for nausea or vomiting., Disp: 120 tablet, Rfl: 3   ibuprofen (ADVIL) 600 MG tablet, Take 1 tablet (600 mg total) by mouth every 6 (six) hours as needed. (Patient not taking: Reported on 06/05/2019), Disp: 60 tablet, Rfl: 0   Physical Exam: Physical Exam Vitals and nursing note reviewed.  HENT:     Head: Normocephalic and atraumatic.  Eyes:     Pupils: Pupils are equal, round, and reactive to light.  Neck:     Thyroid: No thyromegaly.  Cardiovascular:     Rate and Rhythm: Normal rate and regular rhythm.  Pulmonary:     Effort: Pulmonary effort is normal.  Abdominal:     General: Bowel sounds are normal. There is no distension.     Palpations: Abdomen is soft.     Tenderness: There is no abdominal tenderness. There is no guarding or rebound.  Genitourinary:    Comments: External: Normal appearing vulva. No lesions noted.   Bimanual examination: Uterus midline, non-tender, normal in size, shape and contour.  No CMT. No adnexal masses. No adnexal tenderness. Pelvis not fixed.  Musculoskeletal:        General: Normal range of motion.     Cervical  back: Normal range of motion and neck supple.  Skin:    General: Skin is warm and dry.  Neurological:     Mental Status: She is alert and oriented to person, place, and time.  Psychiatric:        Mood and Affect: Affect normal.        Judgment: Judgment normal.     Assessment: Jody Mooney is a 25 y.o. G2P1001 [redacted]w[redacted]d based on Patient's last menstrual period was 06/19/2020. with an Estimated Date of Delivery: 03/26/21,  for prenatal care.  Plan:  1) Avoid alcoholic beverages. 2) Patient encouraged not to smoke.  3) Discontinue the use of all non-medicinal drugs and chemicals.  4) Take prenatal vitamins daily.  5) Seatbelt use advised 6) Nutrition, food safety (fish, cheese advisories, and high nitrite foods) and exercise discussed. 7) Hospital and practice style delivering at [facility] discussed  8) Patient is asked about travel to areas at risk for the [place] virus, and counseled to avoid travel and exposure to mosquitoes or sexual partners who may have themselves been exposed to the virus. Testing is discussed, and will be ordered as appropriate.  9) Childbirth classes at [facility] advised 10) Genetic Screening, such as with 1st Trimester Screening, cell free fetal DNA, AFP testing, and Ultrasound, as well as with amniocentesis and CVS as appropriate, is discussed with patient. She plans to have genetic testing this pregnancy.  Encouraged to start PNV Discussed ASA 81 mg initiation after 12 weeks of pregnancy Reviewed plan of care for nausea in pregnancy NOB labs today.  Oriented to care in office Transvaginal US: Showed viable IUP. +FHR 161 BPM.  CRL 8 wks 6 days     Problem list reviewed and updated.  I discussed the assessment and treatment plan with the patient. The patient was provided an opportunity to ask questions and all were answered. The patient agreed with the plan and demonstrated an understanding of the instructions.  Jody Prows MD [facility] 08/18/2020 9:28 AM

## 2020-08-19 LAB — HEMOGLOBIN A1C
Est. average glucose Bld gHb Est-mCnc: 105 mg/dL
Hgb A1c MFr Bld: 5.3 % (ref 4.8–5.6)

## 2020-08-19 LAB — PROTEIN / CREATININE RATIO, URINE
Creatinine, Urine: 170.3 mg/dL
Protein, Ur: 14.4 mg/dL
Protein/Creat Ratio: 85 mg/g creat (ref 0–200)

## 2020-08-19 LAB — RPR+RH+ABO+RUB AB+AB SCR+CB...
Antibody Screen: NEGATIVE
HIV Screen 4th Generation wRfx: NONREACTIVE
Hematocrit: 42.7 % (ref 34.0–46.6)
Hemoglobin: 15 g/dL (ref 11.1–15.9)
Hepatitis B Surface Ag: NEGATIVE
MCH: 29.8 pg (ref 26.6–33.0)
MCHC: 35.1 g/dL (ref 31.5–35.7)
MCV: 85 fL (ref 79–97)
Platelets: 240 10*3/uL (ref 150–450)
RBC: 5.04 x10E6/uL (ref 3.77–5.28)
RDW: 11.9 % (ref 11.7–15.4)
RPR Ser Ql: NONREACTIVE
Rh Factor: POSITIVE
Rubella Antibodies, IGG: 6.12 index (ref >0.99)
Varicella zoster IgG: <135 index — ABNORMAL LOW (ref >165)
WBC: 7.8 10*3/uL (ref 3.4–10.8)

## 2020-08-19 LAB — COMPREHENSIVE METABOLIC PANEL
ALT: 10 IU/L (ref 0–32)
AST: 13 IU/L (ref 0–40)
Albumin/Globulin Ratio: 1.7 (ref 1.2–2.2)
Albumin: 4.2 g/dL (ref 3.9–5.0)
Alkaline Phosphatase: 85 IU/L (ref 44–121)
BUN/Creatinine Ratio: 9 (ref 9–23)
BUN: 7 mg/dL (ref 6–20)
Bilirubin Total: 0.3 mg/dL (ref 0.0–1.2)
CO2: 24 mmol/L (ref 20–29)
Calcium: 9.1 mg/dL (ref 8.7–10.2)
Chloride: 101 mmol/L (ref 96–106)
Creatinine, Ser: 0.74 mg/dL (ref 0.57–1.00)
Globulin, Total: 2.5 g/dL (ref 1.5–4.5)
Glucose: 68 mg/dL (ref 65–99)
Potassium: 4 mmol/L (ref 3.5–5.2)
Sodium: 137 mmol/L (ref 134–144)
Total Protein: 6.7 g/dL (ref 6.0–8.5)
eGFR: 116 mL/min/{1.73_m2} (ref >59)

## 2020-08-19 LAB — HEPATITIS C ANTIBODY: Hep C Virus Ab: <0.1 s/co ratio (ref 0.0–0.9)

## 2020-08-20 LAB — CERVICOVAGINAL ANCILLARY ONLY
Chlamydia: NEGATIVE
Comment: NEGATIVE
Comment: NEGATIVE
Comment: NORMAL
Neisseria Gonorrhea: NEGATIVE
Trichomonas: NEGATIVE

## 2020-08-20 LAB — URINE CULTURE

## 2020-08-26 LAB — SPECIMEN STATUS REPORT

## 2020-08-27 ENCOUNTER — Other Ambulatory Visit: Payer: Self-pay | Admitting: Obstetrics and Gynecology

## 2020-08-27 DIAGNOSIS — Z3481 Encounter for supervision of other normal pregnancy, first trimester: Secondary | ICD-10-CM

## 2020-08-27 MED ORDER — PRENATE MINI 18-0.6-0.4-350 MG PO CAPS: 1.0000 | ORAL_CAPSULE | Freq: Every day | ORAL | 11 refills | Status: DC

## 2020-08-28 LAB — MONITOR DRUG PROFILE 10(MW)

## 2020-09-02 ENCOUNTER — Other Ambulatory Visit: Payer: Self-pay

## 2020-09-02 ENCOUNTER — Ambulatory Visit (INDEPENDENT_AMBULATORY_CARE_PROVIDER_SITE_OTHER): Payer: Medicaid Other | Admitting: Obstetrics and Gynecology

## 2020-09-02 ENCOUNTER — Encounter: Payer: Self-pay | Admitting: Obstetrics and Gynecology

## 2020-09-02 VITALS — BP 122/70 | Ht 64.0 in | Wt 220.2 lb

## 2020-09-02 DIAGNOSIS — Z3A10 10 weeks gestation of pregnancy: Secondary | ICD-10-CM

## 2020-09-02 DIAGNOSIS — Z1379 Encounter for other screening for genetic and chromosomal anomalies: Secondary | ICD-10-CM | POA: Diagnosis not present

## 2020-09-02 DIAGNOSIS — Z3481 Encounter for supervision of other normal pregnancy, first trimester: Secondary | ICD-10-CM

## 2020-09-02 LAB — POCT URINALYSIS DIPSTICK OB
Glucose, UA: NEGATIVE
POC,PROTEIN,UA: NEGATIVE

## 2020-09-02 NOTE — Progress Notes (Signed)
Routine Prenatal Care Visit  Subjective  Jody Mooney is a 25 y.o. G2P1001 at [redacted]w[redacted]d being seen today for ongoing prenatal care.  She is currently monitored for the following issues for this low-risk pregnancy and has Encounter for general adult medical examination with abnormal findings; Class 2 obesity due to excess calories without serious comorbidity with body mass index (BMI) of 35.0 to 35.9 in adult; Chest pain; Stressful job; Attempting to conceive; and Supervision of normal pregnancy on their problem list.  ----------------------------------------------------------------------------------- Patient reports sharp pain 2 days ago which resolved after eating. Vomiting improved with promethazine PRN but nausea persists   Contractions: Regular. Vag. Bleeding: None.  Movement: Absent. Denies leaking of fluid.  ----------------------------------------------------------------------------------- The following portions of the patient's history were reviewed and updated as appropriate: allergies, current medications, past family history, past medical history, past social history, past surgical history and problem list. Problem list updated.   Objective  Blood pressure 122/70, height 5\' 4"  (1.626 m), weight 220 lb 3.2 oz (99.9 kg), last menstrual period 06/19/2020. Pregravid weight Pregravid weight not on file Total Weight Gain Not found. Urinalysis:      Fetal Status: Fetal Heart Rate (bpm): 156   Movement: Absent     General:  Alert, oriented and cooperative. Patient is in no acute distress.  Skin: Skin is warm and dry. No rash noted.   Cardiovascular: Normal heart rate noted  Respiratory: Normal respiratory effort, no problems with respiration noted  Abdomen: Soft, gravid, appropriate for gestational age. Pain/Pressure: Absent     Pelvic:  Cervical exam deferred        Extremities: Normal range of motion.     Mental Status: Normal mood and affect. Normal behavior. Normal judgment and  thought content.     Assessment   25 y.o. G2P1001 at [redacted]w[redacted]d by  03/26/2021, by Last Menstrual Period presenting for routine prenatal visit  Plan   pregnancy 2 Problems (from 08/18/20 to present)     Problem Noted Resolved   Supervision of normal pregnancy 08/18/2020 by Homero Fellers, MD No   Overview Addendum 09/02/2020  2:24 PM by Homero Fellers, MD     Nursing Staff Provider  Office Location  [facility] Dating  LMP = 8wk [place]  Language  English Anatomy US    Flu Vaccine   Genetic Screen  NIPS:   TDaP vaccine    Hgb A1C or  GTT Early : Third trimester :   Covid Unvaccinated Covid+  12/2019   LAB RESULTS   Rhogam   Blood Type O/Positive/-- (06/20 1008)   Feeding Plan  Antibody Negative (06/20 1008)  Contraception  Rubella 6.12 (06/20 1008)  Circumcision  RPR Non Reactive (06/20 1008)   Pediatrician   HBsAg Negative (06/20 1008)   Support Person  HIV Non Reactive (06/20 1008)  Prenatal Classes  Varicella     GBS  (For PCN allergy, check sensitivities)   BTL Consent     VBAC Consent  Pap  2022 NIL    Hgb Electro   2017 neg    CF      SMA        Obesity in pregnancy: Pregravid BMI 55           Maternit21 testing today  Gestational age appropriate obstetric precautions including but not limited to vaginal bleeding, contractions, leaking of fluid and fetal movement were reviewed in detail with the patient.    Return in about 2 weeks (around 09/16/2020) for  ROB in person.  Homero Fellers MD [facility] 09/02/2020, 2:33 PM

## 2020-09-02 NOTE — Patient Instructions (Signed)
Hyperemesis Gravidarum Hyperemesis gravidarum is a severe form of nausea and vomiting that happens during pregnancy. Hyperemesis is worse than morning sickness. It may cause you to have nausea or vomiting all day for many days. It may keep you from eating and drinking enough food and liquids, which can lead to dehydration, malnutrition, and weight loss. Hyperemesis usually occurs during the first half (the first 20 weeks) of pregnancy. It often goes away once a woman is in her second half of pregnancy. However, sometimes hyperemesis continues through anentire pregnancy. What are the causes? The cause of this condition is not known. It may be associated with: Changes in hormones in the body during pregnancy. Changes in the gastrointestinal system. Genetic or inherited conditions. What are the signs or symptoms? Symptoms of this condition include: Severe nausea and vomiting that does not go away. Problems keeping food down. Weight loss. Loss of body fluid (dehydration). Loss of appetite. You may have no desire to eat or you may not like the food you have previously enjoyed. How is this diagnosed? This condition may be diagnosed based on your medical history, your symptoms,and a physical exam. You may also have other tests, including: Blood tests. Urine tests. Blood pressure tests. Ultrasound to look for problems with the placenta or to check if you are pregnant with more than one baby. How is this treated? This condition is managed by controlling symptoms. This may include: Following an eating plan. This can help to lessen nausea and vomiting. Treatments that do not use medicine. These include acupressure bracelets, hypnosis, and eating or drinking foods or fluids that contain ginger, ginger ale, or ginger tea. Taking prescription medicine or over-the-counter medicine as told by your health care provider. Continuing to take prenatal vitamins. You may need to change what kind you take and when  you take them. Follow your health care provider's instructions about prenatal vitamins. An eating plan and medicines are often used together to help control symptoms. If medicines do not help relieve nausea and vomiting, you may need to receivefluids through an IV at the hospital. Follow these instructions at home: To help relieve your symptoms, listen to your body. Everyone is different and has different preferences. Find what works best for you. Here are some thingsyou can try to help relieve your symptoms: Meals and snacks  Eat 5-6 small meals daily instead of 3 large meals. Eating small meals and snacks can help you avoid an empty stomach. Before getting out of bed, eat a couple of crackers to avoid moving around on an empty stomach. Eat a protein-rich snack before bed. Examples include cheese and crackers, or a peanut butter sandwich made with 1 slice of whole-wheat bread and 1 tsp (5 g) of peanut butter. Eat and drink slowly. Try eating starchy foods as these are usually tolerated well. Examples include cereal, toast, bread, potatoes, pasta, rice, and pretzels. Eat at least one serving of protein with your meals and snacks. Protein options include lean meats, poultry, seafood, beans, nuts, nut butters, eggs, cheese, and yogurt. Eat or suck on things that have ginger in them. It may help to relieve nausea. Add  tsp (0.44 g) ground ginger to hot tea, or choose ginger tea.  Fluids It is important to stay hydrated. Try to: Drink small amounts of fluids often. Drink fluids 30 minutes before or after a meal to help lessen the feeling of a full stomach. Drink 100% fruit juice or an electrolyte drink. An electrolyte drink contains sodium, potassium, and  chloride. Drink fluids that are cold, clear, and carbonated or sour. These include lemonade, ginger ale, lemon-lime soda, ice water, and sparkling water. Things to avoid Avoid the following: Eating foods that trigger your symptoms. These may  include spicy foods, coffee, high-fat foods, very sweet foods, and acidic foods. Drinking more than 1 cup of fluid at a time. Skipping meals. Nausea can be more intense on an empty stomach. If you cannot tolerate food, do not force it. Try sucking on ice chips or other frozen items and make up for missed calories later. Lying down within 2 hours after eating. Being exposed to environmental triggers. These may include food smells, smoky rooms, closed spaces, rooms with strong smells, warm or humid places, overly loud and noisy rooms, and rooms with motion or flickering lights. Try eating meals in a well-ventilated area that is free of strong smells. Making quick and sudden changes in your movement. Taking iron pills and multivitamins that contain iron. If you take prescription iron pills, do not stop taking them unless your health care provider approves. Preparing food. The smell of food can spoil your appetite or trigger nausea. General instructions Brush your teeth or use a mouth rinse after meals. Take over-the-counter and prescription medicines only as told by your health care provider. Follow instructions from your health care provider about eating or drinking restrictions. Talk with your health care provider about starting a supplement of vitamin B6. Continue to take your prenatal vitamins as told by your health care provider. If you are having trouble taking your prenatal vitamins, talk with your health care provider about other options. Keep all follow-up visits. This is important. Follow-up visits include prenatal visits. Contact a health care provider if: You have pain in your abdomen. You have a severe headache. You have vision problems. You are losing weight. You feel weak or dizzy. You cannot eat or drink without vomiting, especially if this goes on for a full day. Get help right away if: You cannot drink fluids without vomiting. You vomit blood. You have constant nausea and  vomiting. You are very weak. You faint. You have a fever and your symptoms suddenly get worse. Summary Hyperemesis gravidarum is a severe form of nausea and vomiting that happens during pregnancy. Making some changes to your eating habits may help relieve nausea and vomiting. This condition may be managed with lifestyle changes and medicines as prescribed by your health care provider. If medicines do not help relieve nausea and vomiting, you may need to receive fluids through an IV at the hospital. This information is not intended to replace advice given to you by your health care provider. Make sure you discuss any questions you have with your healthcare provider. Document Revised: 09/10/2019 Document Reviewed: 09/10/2019 Elsevier Patient Education  [place].

## 2020-09-06 LAB — MATERNIT21 PLUS CORE+SCA
Fetal Fraction: 7
Monosomy X (Turner Syndrome): NOT DETECTED
Result (T21): NEGATIVE
Trisomy 13 (Patau syndrome): NEGATIVE
Trisomy 18 (Edwards syndrome): NEGATIVE
Trisomy 21 (Down syndrome): NEGATIVE
XXX (Triple X Syndrome): NOT DETECTED
XXY (Klinefelter Syndrome): NOT DETECTED
XYY (Jacobs Syndrome): NOT DETECTED

## 2020-09-15 ENCOUNTER — Telehealth: Payer: Self-pay

## 2020-09-15 DIAGNOSIS — Z20822 Contact with and (suspected) exposure to covid-19: Secondary | ICD-10-CM | POA: Diagnosis not present

## 2020-09-15 NOTE — Telephone Encounter (Signed)
Pt returned call; adv for cough - plain robitussin, Hall's cough drops; sinus/nasal congestion - plain sudafed, sip hot beverages/soups; sore throat - warm salt water gargles; aches/pain/fever - e.s. tylenol; push fluids and rest. Number for HD given.

## 2020-09-15 NOTE — Telephone Encounter (Signed)
Pt calling; has tested positive for covid.  [place]

## 2020-09-16 ENCOUNTER — Encounter: Payer: Medicaid Other | Admitting: Obstetrics and Gynecology

## 2020-09-24 ENCOUNTER — Ambulatory Visit (INDEPENDENT_AMBULATORY_CARE_PROVIDER_SITE_OTHER): Payer: Medicaid Other | Admitting: Obstetrics and Gynecology

## 2020-09-24 ENCOUNTER — Encounter: Payer: Self-pay | Admitting: Obstetrics and Gynecology

## 2020-09-24 ENCOUNTER — Other Ambulatory Visit: Payer: Self-pay

## 2020-09-24 DIAGNOSIS — Z3A13 13 weeks gestation of pregnancy: Secondary | ICD-10-CM

## 2020-09-24 DIAGNOSIS — R1110 Vomiting, unspecified: Secondary | ICD-10-CM

## 2020-09-24 DIAGNOSIS — Z3481 Encounter for supervision of other normal pregnancy, first trimester: Secondary | ICD-10-CM

## 2020-09-24 DIAGNOSIS — O210 Mild hyperemesis gravidarum: Secondary | ICD-10-CM

## 2020-09-24 NOTE — Patient Instructions (Signed)
Initial steps to help :   [email or web address]  B6 (pyridoxine) 25 mg,  3-4 times a day- 200 mg a day total Unisom (doxylamine) 25 mg at bedtime **B6 and Unisom are available as a combination prescription medications called diclegis and bonjesta  B1 (thiamin)  50-100 mg 1-2 a day-  100 mg a day total  Continue prenatal vitamin with iron and thiamin. If it is not tolerated switch to 1 mg of folic acid.  Can add medication for gastric reflux if needed.  Subsequent steps to be added to B1, B6, and Unisom:  Antihistamine (one of the following medications) Dramamine      25-50 mg every 4-6 hours Benadryl      25-50 mg every 4-6 hours Meclizine      25 mg every 6 hours  2. Dopamine Antagonist (one of the following medications) Metoclopramide  (Reglan)  5-10 mg every 6-8 hours         PO Promethazine   (Phenergan)   12.5-25 mg every 4-6 hours      PO or rectal Prochlorperazine  (Compazine)  5-10 mg every 6-8 hours     '25mg'$  BID rectally   Subsequent steps if there has still not been improvement in symptoms:  3. Daily stool softner:  Colace 100 mg twice a day  4. Ondansetron  (Zofran)   4-8 mg every 6-8 hours    Initial steps to help :   B6 (pyridoxine) 25 mg,  3-4 times a day- 200 mg a day total Unisom (doxylamine) 25 mg at bedtime **B6 and Unisom are available as a combination prescription medications called diclegis and bonjesta  B1 (thiamin)  50-100 mg 1-2 a day-  100 mg a day total  Continue prenatal vitamin with iron and thiamin. If it is not tolerated switch to 1 mg of folic acid.  Can add medication for gastric reflux if needed.  Subsequent steps to be added to B1, B6, and Unisom:  Antihistamine (one of the following medications) Dramamine      25-50 mg every 4-6 hours Benadryl      25-50 mg every 4-6 hours Meclizine      25 mg every 6 hours  2. Dopamine Antagonist (one of the following medications) Metoclopramide  (Reglan)  5-10 mg every 6-8 hours         PO Promethazine    (Phenergan)   12.5-25 mg every 4-6 hours      PO or rectal Prochlorperazine  (Compazine)  5-10 mg every 6-8 hours     '25mg'$  BID rectally   Subsequent steps if there has still not been improvement in symptoms:  3. Daily stool softner:  Colace 100 mg twice a day  4. Ondansetron  (Zofran)   4-8 mg every 6-8 hours

## 2020-09-24 NOTE — Progress Notes (Signed)
Routine Prenatal Care Visit- Virtual Visit  Subjective   Virtual Visit via Telephone Note  I connected with Jody Mooney on 09/24/20 at 11:10 AM EDT by telephone and verified that I am speaking with the correct person using two identifiers.   I discussed the limitations, risks, security and privacy concerns of performing an evaluation and management service by telephone and the availability of in person appointments. I also discussed with the patient that there may be a patient responsible charge related to this service. The patient expressed understanding and agreed to proceed.  The patient was at home I spoke with the patient from my  office The names of people involved in this encounter were: Jeani Hawking and  Dr. Ledon Snare Jody Mooney is a 25 y.o. G2P1001 at 43w6dbeing seen today for ongoing prenatal care.  She is currently monitored for the following issues for this low-risk pregnancy and has Encounter for general adult medical examination with abnormal findings; Class 2 obesity due to excess calories without serious comorbidity with body mass index (BMI) of 35.0 to 35.9 in adult; Chest pain; Stressful job; Attempting to conceive; and Supervision of normal pregnancy on their problem list.  ----------------------------------------------------------------------------------- Patient reports nausea, vomiting, and recent Covid infection . She reports she has been recovering from covid. However she continue to experience nausea and vomiting. She vomits 1-2 times a day.  She has been eating small frequent meals. She is able to keep water down. Only has phenergan. She takes this medication as needed.  Usually takes once to twice a day.  Takes extra strength tylenol for headaches.    Contractions: Not present. Vag. Bleeding: None.  Movement: Present. Denies leaking of fluid.  ----------------------------------------------------------------------------------- The following  portions of the patient's history were reviewed and updated as appropriate: allergies, current medications, past family history, past medical history, past social history, past surgical history and problem list. Problem list updated.   Objective  Last menstrual period 06/19/2020. Pregravid weight Pregravid weight not on file Total Weight Gain Not found. Urinalysis:      Fetal Status:     Movement: Present     Physical Exam could not be performed. Because of the COVID-19 outbreak this visit was performed over the phone and not in person.   Assessment   25y.o. G2P1001 at 113w6dy  03/26/2021, by Last Menstrual Period presenting for routine prenatal visit  Plan   pregnancy 2 Problems (from 08/18/20 to present)     Problem Noted Resolved   Supervision of normal pregnancy 08/18/2020 by ScHomero FellersMD No   Overview Addendum 09/02/2020  2:24 PM by ScHomero FellersMD     Nursing Staff Provider  Office Location  [facility] Dating  LMP = 8wk [place]  English Anatomy [place]  Flu Vaccine   Genetic Screen  NIPS:   TDaP vaccine    Hgb A1C or  GTT Early : Third trimester :   Covid Unvaccinated Covid+  12/2019   LAB RESULTS   Rhogam   Blood Type O/Positive/-- (06/20 1008)   Feeding Plan  Antibody Negative (06/20 1008)  Contraception  Rubella 6.12 (06/20 1008)  Circumcision  RPR Non Reactive (06/20 1008)   Pediatrician   HBsAg Negative (06/20 1008)   Support Person  HIV Non Reactive (06/20 1008)  Prenatal Classes  Varicella     GBS  (For PCN allergy, check sensitivities)   BTL Consent  VBAC Consent  Pap  2022 NIL    Hgb Electro   2017 neg    CF      SMA        Obesity in pregnancy: Pregravid BMI 47           Gestational age appropriate obstetric precautions including but not limited to vaginal bleeding, contractions, leaking of fluid and fetal movement were reviewed in detail with the patient.     Follow Up Instructions: Reviewed plan for hyperemesis  management. Encouraged to pick up zofran prescription. Encouraged to take phenergan every 6 hours even if she is not nauseous. Continue medications as scheduled for at least 2 weeks until tapering the medications.    I discussed the assessment and treatment plan with the patient. The patient was provided an opportunity to ask questions and all were answered. The patient agreed with the plan and demonstrated an understanding of the instructions.   The patient was advised to call back or seek an in-person evaluation if the symptoms worsen or if the condition fails to improve as anticipated.  I provided 10 minutes of non-face-to-face time during this encounter.  Return in about 2 weeks (around 10/08/2020) for ROB in person.  Adrian Prows MD [facility], [facility] 09/24/2020 11:51 AM

## 2020-10-15 ENCOUNTER — Ambulatory Visit (INDEPENDENT_AMBULATORY_CARE_PROVIDER_SITE_OTHER): Payer: Medicaid Other | Admitting: Obstetrics and Gynecology

## 2020-10-15 ENCOUNTER — Encounter: Payer: Self-pay | Admitting: Obstetrics and Gynecology

## 2020-10-15 ENCOUNTER — Other Ambulatory Visit: Payer: Self-pay

## 2020-10-15 VITALS — BP 120/72 | Ht 64.0 in | Wt 220.4 lb

## 2020-10-15 DIAGNOSIS — Z3A16 16 weeks gestation of pregnancy: Secondary | ICD-10-CM

## 2020-10-15 DIAGNOSIS — Z3482 Encounter for supervision of other normal pregnancy, second trimester: Secondary | ICD-10-CM

## 2020-10-15 NOTE — Progress Notes (Signed)
Routine Prenatal Care Visit  Subjective  Jody Mooney is a 25 y.o. G2P1001 at 29w6dbeing seen today for ongoing prenatal care.  She is currently monitored for the following issues for this low-risk pregnancy and has Encounter for general adult medical examination with abnormal findings; Class 2 obesity due to excess calories without serious comorbidity with body mass index (BMI) of 35.0 to 35.9 in adult; Chest pain; Stressful job; Attempting to conceive; and Supervision of normal pregnancy on their problem list.  ----------------------------------------------------------------------------------- Patient reports no complaints.  Nausea has improved. Recovered from [place] Contractions: Not present. Vag. Bleeding: None.  Movement: Present. Denies leaking of fluid.  ----------------------------------------------------------------------------------- The following portions of the patient's history were reviewed and updated as appropriate: allergies, current medications, past family history, past medical history, past social history, past surgical history and problem list. Problem list updated.   Objective  Blood pressure 120/72, height '5\' 4"'$  (1.626 m), weight 220 lb 6.4 oz (100 kg), last menstrual period 06/19/2020. Pregravid weight Pregravid weight not on file Total Weight Gain Not found. Urinalysis:      Fetal Status: Fetal Heart Rate (bpm): 150   Movement: Present     General:  Alert, oriented and cooperative. Patient is in no acute distress.  Skin: Skin is warm and dry. No rash noted.   Cardiovascular: Normal heart rate noted  Respiratory: Normal respiratory effort, no problems with respiration noted  Abdomen: Soft, gravid, appropriate for gestational age. Pain/Pressure: Absent     Pelvic:  Cervical exam deferred        Extremities: Normal range of motion.  Edema: None  Mental Status: Normal mood and affect. Normal behavior. Normal judgment and thought content.     Assessment    25y.o. G2P1001 at 165w6dy  03/26/2021, by Last Menstrual Period presenting for routine prenatal visit  Plan   pregnancy 2 Problems (from 08/18/20 to present)     Problem Noted Resolved   Supervision of normal pregnancy 08/18/2020 by ScHomero FellersMD No   Overview Addendum 10/15/2020  2:16 PM by ScHomero FellersMD     Nursing Staff Provider  Office Location  [facility] Dating  LMP = 8wk [place]  English Anatomy [place]  Flu Vaccine   Genetic Screen  NIPS: normal xx  TDaP vaccine    Hgb A1C or  GTT Early : hgba1c 5.3 Third trimester :   Covid Unvaccinated Covid+  12/2019   LAB RESULTS   Rhogam  Not needed Blood Type O/Positive/-- (06/20 1008)   Feeding Plan  Antibody Negative (06/20 1008)  Contraception  Rubella 6.12 (06/20 1008)  Circumcision  RPR Non Reactive (06/20 1008)   Pediatrician   HBsAg Negative (06/20 1008)   Support Person  HIV Non Reactive (06/20 1008)  Prenatal Classes  Varicella Non-immune    GBS  (For PCN allergy, check sensitivities)   BTL Consent     VBAC Consent  Pap  2022 NIL    Hgb Electro   2017 neg    CF      SMA        Obesity in pregnancy: Pregravid BMI 3760         Gestational age appropriate obstetric precautions including but not limited to vaginal bleeding, contractions, leaking of fluid and fetal movement were reviewed in detail with the patient.    Return for ROB in 8 weeks, ROB and 1 GTT in 12 weeeks.  [place]  MD [facility] 10/15/2020, 2:26 PM

## 2020-11-07 ENCOUNTER — Ambulatory Visit
Admission: RE | Admit: 2020-11-07 | Discharge: 2020-11-07 | Disposition: A | Discharge status: Home / Self Care | Payer: Medicaid Other | Source: Ambulatory Visit | Attending: Obstetrics and Gynecology | Admitting: Obstetrics and Gynecology

## 2020-11-07 ENCOUNTER — Other Ambulatory Visit: Payer: Self-pay

## 2020-11-07 DIAGNOSIS — Z3A20 20 weeks gestation of pregnancy: Secondary | ICD-10-CM | POA: Diagnosis not present

## 2020-11-07 DIAGNOSIS — Z3492 Encounter for supervision of normal pregnancy, unspecified, second trimester: Secondary | ICD-10-CM | POA: Diagnosis not present

## 2020-11-07 DIAGNOSIS — Z3482 Encounter for supervision of other normal pregnancy, second trimester: Secondary | ICD-10-CM | POA: Insufficient documentation

## 2020-11-10 ENCOUNTER — Other Ambulatory Visit: Payer: Self-pay

## 2020-11-10 ENCOUNTER — Ambulatory Visit (INDEPENDENT_AMBULATORY_CARE_PROVIDER_SITE_OTHER): Payer: Medicaid Other | Admitting: Obstetrics and Gynecology

## 2020-11-10 ENCOUNTER — Encounter: Payer: Self-pay | Admitting: Obstetrics and Gynecology

## 2020-11-10 VITALS — BP 118/70 | Wt 224.6 lb

## 2020-11-10 DIAGNOSIS — Z3482 Encounter for supervision of other normal pregnancy, second trimester: Secondary | ICD-10-CM

## 2020-11-10 DIAGNOSIS — Z3A20 20 weeks gestation of pregnancy: Secondary | ICD-10-CM

## 2020-11-10 LAB — POCT URINALYSIS DIPSTICK OB
Glucose, UA: NEGATIVE
POC,PROTEIN,UA: NEGATIVE

## 2020-11-10 NOTE — Addendum Note (Signed)
Addended by: Vernia Buff A on: 11/10/2020 05:14 PM   Modules accepted: Orders

## 2020-11-10 NOTE — Progress Notes (Signed)
Routine Prenatal Care Visit  Subjective  Jody Mooney is a 25 y.o. G2P1001 at 32w4dbeing seen today for ongoing prenatal care.  She is currently monitored for the following issues for this low-risk pregnancy and has Encounter for general adult medical examination with abnormal findings; Class 2 obesity due to excess calories without serious comorbidity with body mass index (BMI) of 35.0 to 35.9 in adult; Chest pain; Stressful job; Attempting to conceive; and Supervision of normal pregnancy on their problem list.  ----------------------------------------------------------------------------------- Patient reports no complaints.   Contractions: Not present. Vag. Bleeding: None.  Movement: Present. Denies leaking of fluid.  ----------------------------------------------------------------------------------- The following portions of the patient's history were reviewed and updated as appropriate: allergies, current medications, past family history, past medical history, past social history, past surgical history and problem list. Problem list updated.   Objective  Blood pressure 118/70, weight 224 lb 9.6 oz (101.9 kg), last menstrual period 06/19/2020. Pregravid weight Pregravid weight not on file Total Weight Gain Not found. Urinalysis:      Fetal Status: Fetal Heart Rate (bpm): 150 Fundal Height: 22 cm Movement: Present     General:  Alert, oriented and cooperative. Patient is in no acute distress.  Skin: Skin is warm and dry. No rash noted.   Cardiovascular: Normal heart rate noted  Respiratory: Normal respiratory effort, no problems with respiration noted  Abdomen: Soft, gravid, appropriate for gestational age. Pain/Pressure: Absent     Pelvic:  Cervical exam deferred        Extremities: Normal range of motion.  Edema: None  Mental Status: Normal mood and affect. Normal behavior. Normal judgment and thought content.     Assessment   25y.o. G2P1001 at 257w4dy  03/26/2021, by  Last Menstrual Period presenting for routine prenatal visit  Plan   pregnancy 2 Problems (from 08/18/20 to present)     Problem Noted Resolved   Supervision of normal pregnancy 08/18/2020 by ScHomero FellersMD No   Overview Addendum 10/15/2020  2:16 PM by ScHomero FellersMD     Nursing Staff Provider  Office Location  [facility] Dating  LMP = 8wk [place]  English Anatomy [place]  Flu Vaccine   Genetic Screen  NIPS: normal xx  TDaP vaccine    Hgb A1C or  GTT Early : hgba1c 5.3 Third trimester :   Covid Unvaccinated Covid+  12/2019   LAB RESULTS   Rhogam  Not needed Blood Type O/Positive/-- (06/20 1008)   Feeding Plan  Antibody Negative (06/20 1008)  Contraception  Rubella 6.12 (06/20 1008)  Circumcision  RPR Non Reactive (06/20 1008)   Pediatrician   HBsAg Negative (06/20 1008)   Support Person  HIV Non Reactive (06/20 1008)  Prenatal Classes  Varicella Non-immune    GBS  (For PCN allergy, check sensitivities)   BTL Consent     VBAC Consent  Pap  2022 NIL    Hgb Electro   2017 neg    CF      SMA        Obesity in pregnancy: Pregravid BMI 37          Discussed prenatal classes with patient  Gestational age appropriate obstetric precautions including but not limited to vaginal bleeding, contractions, leaking of fluid and fetal movement were reviewed in detail with the patient.    Return in about 4 weeks (around 12/08/2020) for ROB in perosn.  ChHomero FellersD [facility], [facility] 11/10/2020, 11:22 AM

## 2020-12-05 ENCOUNTER — Other Ambulatory Visit: Payer: Self-pay

## 2020-12-05 ENCOUNTER — Ambulatory Visit
Admission: RE | Admit: 2020-12-05 | Discharge: 2020-12-05 | Disposition: A | Discharge status: Home / Self Care | Payer: Medicaid Other | Source: Ambulatory Visit | Attending: Obstetrics and Gynecology | Admitting: Obstetrics and Gynecology

## 2020-12-05 DIAGNOSIS — Z3492 Encounter for supervision of normal pregnancy, unspecified, second trimester: Secondary | ICD-10-CM | POA: Diagnosis not present

## 2020-12-05 DIAGNOSIS — Z3482 Encounter for supervision of other normal pregnancy, second trimester: Secondary | ICD-10-CM | POA: Diagnosis not present

## 2020-12-05 DIAGNOSIS — Z3A24 24 weeks gestation of pregnancy: Secondary | ICD-10-CM | POA: Diagnosis not present

## 2020-12-10 ENCOUNTER — Other Ambulatory Visit: Payer: Self-pay

## 2020-12-10 ENCOUNTER — Ambulatory Visit (INDEPENDENT_AMBULATORY_CARE_PROVIDER_SITE_OTHER): Payer: Medicaid Other | Admitting: Advanced Practice Midwife

## 2020-12-10 ENCOUNTER — Encounter: Payer: Self-pay | Admitting: Advanced Practice Midwife

## 2020-12-10 VITALS — BP 120/80 | Wt 230.0 lb

## 2020-12-10 DIAGNOSIS — Z3A24 24 weeks gestation of pregnancy: Secondary | ICD-10-CM

## 2020-12-10 DIAGNOSIS — Z3482 Encounter for supervision of other normal pregnancy, second trimester: Secondary | ICD-10-CM

## 2020-12-10 LAB — POCT URINALYSIS DIPSTICK OB
Glucose, UA: NEGATIVE
POC,PROTEIN,UA: NEGATIVE

## 2020-12-10 NOTE — Progress Notes (Signed)
  Routine Prenatal Care Visit  Subjective  Jody Mooney is a 25 y.o. G2P1001 at [redacted]w[redacted]d being seen today for ongoing prenatal care.  She is currently monitored for the following issues for this low-risk pregnancy and has Encounter for general adult medical examination with abnormal findings; Class 2 obesity due to excess calories without serious comorbidity with body mass index (BMI) of 35.0 to 35.9 in adult; Chest pain; Stressful job; Attempting to conceive; and Supervision of normal pregnancy on their problem list.  ----------------------------------------------------------------------------------- Patient reports no complaints.   Contractions: Not present. Vag. Bleeding: None.  Movement: Present. Leaking Fluid denies.  ----------------------------------------------------------------------------------- The following portions of the patient's history were reviewed and updated as appropriate: allergies, current medications, past family history, past medical history, past social history, past surgical history and problem list. Problem list updated.  Objective  Blood pressure 120/80, weight 230 lb (104.3 kg), last menstrual period 06/19/2020. Pregravid weight 218 lb (98.9 kg) Total Weight Gain 12 lb (5.443 kg) Urinalysis: Urine Protein    Urine Glucose    Fetal Status: Fetal Heart Rate (bpm): 147 Fundal Height: 24 cm Movement: Present     General:  Alert, oriented and cooperative. Patient is in no acute distress.  Skin: Skin is warm and dry. No rash noted.   Cardiovascular: Normal heart rate noted  Respiratory: Normal respiratory effort, no problems with respiration noted  Abdomen: Soft, gravid, appropriate for gestational age. Pain/Pressure: Absent     Pelvic:  Cervical exam deferred        Extremities: Normal range of motion.  Edema: None  Mental Status: Normal mood and affect. Normal behavior. Normal judgment and thought content.   Assessment   25 y.o. G2P1001 at [redacted]w[redacted]d by  03/26/2021, by  Last Menstrual Period presenting for routine prenatal visit  Plan   pregnancy 2 Problems (from 08/18/20 to present)    Problem Noted Resolved   Supervision of normal pregnancy 08/18/2020 by Homero Fellers, MD No   Overview Addendum 11/10/2020 11:21 AM by Homero Fellers, MD     Nursing Staff Provider  Office Location  [facility] Dating  LMP = 8wk [place]  Language  English Anatomy US    Flu Vaccine   Genetic Screen  NIPS: normal xx  TDaP vaccine    Hgb A1C or  GTT Early : hgba1c 5.3 Third trimester :   Covid Unvaccinated Covid+  12/2019   LAB RESULTS   Rhogam  Not needed Blood Type O/Positive/-- (06/20 1008)   Feeding Plan  Breast Antibody Negative (06/20 1008)  Contraception  condoms Rubella 6.12 (06/20 1008)  Circumcision  RPR Non Reactive (06/20 1008)   Pediatrician   HBsAg Negative (06/20 1008)   Support Person FOB: [name] HIV Non Reactive (06/20 1008)  Prenatal Classes Discussed Varicella Non-immune    GBS  (For PCN allergy, check sensitivities)   BTL Consent     VBAC Consent  Pap  2022 NIL    Hgb Electro   2017 neg    CF      SMA         Obesity in pregnancy: Pregravid BMI 37          Preterm labor symptoms and general obstetric precautions including but not limited to vaginal bleeding, contractions, leaking of fluid and fetal movement were reviewed in detail with the patient. Please refer to After Visit Summary for other counseling recommendations.   Return for scheduled visit.  Rod Can, CNM 12/10/2020 10:32 AM

## 2020-12-10 NOTE — Addendum Note (Signed)
Addended by: Quintella Baton D on: 12/10/2020 11:09 AM   Modules accepted: Orders

## 2021-01-08 ENCOUNTER — Other Ambulatory Visit: Payer: Self-pay

## 2021-01-08 ENCOUNTER — Other Ambulatory Visit: Payer: Medicaid Other

## 2021-01-08 ENCOUNTER — Ambulatory Visit (INDEPENDENT_AMBULATORY_CARE_PROVIDER_SITE_OTHER): Payer: Medicaid Other | Admitting: Obstetrics and Gynecology

## 2021-01-08 ENCOUNTER — Encounter: Payer: Self-pay | Admitting: Obstetrics and Gynecology

## 2021-01-08 VITALS — BP 118/72 | Ht 64.0 in | Wt 237.8 lb

## 2021-01-08 DIAGNOSIS — Z23 Encounter for immunization: Secondary | ICD-10-CM | POA: Diagnosis not present

## 2021-01-08 DIAGNOSIS — Z3482 Encounter for supervision of other normal pregnancy, second trimester: Secondary | ICD-10-CM

## 2021-01-08 DIAGNOSIS — Z3A29 29 weeks gestation of pregnancy: Secondary | ICD-10-CM

## 2021-01-08 LAB — POCT URINALYSIS DIPSTICK OB
Glucose, UA: NEGATIVE
POC,PROTEIN,UA: NEGATIVE

## 2021-01-08 NOTE — Patient Instructions (Signed)

## 2021-01-08 NOTE — Progress Notes (Signed)
Routine Prenatal Care Visit  Subjective  Jody Mooney is a 25 y.o. G2P1001 at [redacted]w[redacted]d being seen today for ongoing prenatal care.  She is currently monitored for the following issues for this low-risk pregnancy and has Encounter for general adult medical examination with abnormal findings; Class 2 obesity due to excess calories without serious comorbidity with body mass index (BMI) of 35.0 to 35.9 in adult; Chest pain; Stressful job; Attempting to conceive; and Supervision of normal pregnancy on their problem list.  ----------------------------------------------------------------------------------- Patient reports no complaints.   Contractions: Not present. Vag. Bleeding: None.  Movement: Present. Denies leaking of fluid.  ----------------------------------------------------------------------------------- The following portions of the patient's history were reviewed and updated as appropriate: allergies, current medications, past family history, past medical history, past social history, past surgical history and problem list. Problem list updated.   Objective  Blood pressure 118/72, height 5\' 4"  (1.626 m), weight 237 lb 12.8 oz (107.9 kg), last menstrual period 06/19/2020. Pregravid weight 218 lb (98.9 kg) Total Weight Gain 19 lb 12.8 oz (8.981 kg) Urinalysis:      Fetal Status: Fetal Heart Rate (bpm): 130 Fundal Height: 28 cm Movement: Present     General:  Alert, oriented and cooperative. Patient is in no acute distress.  Skin: Skin is warm and dry. No rash noted.   Cardiovascular: Normal heart rate noted  Respiratory: Normal respiratory effort, no problems with respiration noted  Abdomen: Soft, gravid, appropriate for gestational age. Pain/Pressure: Present     Pelvic:  Cervical exam deferred        Extremities: Normal range of motion.  Edema: None  Mental Status: Normal mood and affect. Normal behavior. Normal judgment and thought content.     Assessment   25 y.o. G2P1001 at  [redacted]w[redacted]d by  03/26/2021, by Last Menstrual Period presenting for routine prenatal visit  Plan   pregnancy 2 Problems (from 08/18/20 to present)     Problem Noted Resolved   Supervision of normal pregnancy 08/18/2020 by Homero Fellers, MD No   Overview Addendum 01/08/2021  9:50 AM by Homero Fellers, MD     Nursing Staff Provider  Office Location  [facility] Dating  LMP = 8wk [place]  Language  English Anatomy US  complete  Flu Vaccine   Genetic Screen  NIPS: normal xx  TDaP vaccine   01/08/2021 Hgb A1C or  GTT Early : hgba1c 5.3 Third trimester :   Covid Unvaccinated Covid+  12/2019   LAB RESULTS   Rhogam  Not needed Blood Type O/Positive/-- (06/20 1008)   Feeding Plan  Breast Antibody Negative (06/20 1008)  Contraception  condoms Rubella 6.12 (06/20 1008)  Circumcision  RPR Non Reactive (06/20 1008)   Pediatrician   HBsAg Negative (06/20 1008)   Support Person FOB: [name] HIV Non Reactive (06/20 1008)  Prenatal Classes Discussed Varicella Non-immune    GBS  (For PCN allergy, check sensitivities)   BTL Consent     VBAC Consent  Pap  2022 NIL    Hgb Electro   2017 neg    CF      SMA        Obesity in pregnancy: Pregravid BMI 37         28 week labs today TDAP today  Gestational age appropriate obstetric precautions including but not limited to vaginal bleeding, contractions, leaking of fluid and fetal movement were reviewed in detail with the patient.    Return in about 2 weeks (around 01/22/2021) for ROB  in person every 2 weeks for 4 visits.  Homero Fellers MD [facility], [facility] 01/08/2021, 10:08 AM

## 2021-01-10 LAB — 28 WEEK RH+PANEL
Basophils Absolute: 0 10*3/uL (ref 0.0–0.2)
Basos: 0 %
EOS (ABSOLUTE): 0.1 10*3/uL (ref 0.0–0.4)
Eos: 1 %
Gestational Diabetes Screen: 132 mg/dL (ref 70–139)
HIV Screen 4th Generation wRfx: NONREACTIVE
Hematocrit: 36.2 % (ref 34.0–46.6)
Hemoglobin: 11.9 g/dL (ref 11.1–15.9)
Immature Grans (Abs): 0.1 10*3/uL (ref 0.0–0.1)
Immature Granulocytes: 1 %
Lymphocytes Absolute: 1.1 10*3/uL (ref 0.7–3.1)
Lymphs: 12 %
MCH: 29.4 pg (ref 26.6–33.0)
MCHC: 32.9 g/dL (ref 31.5–35.7)
MCV: 89 fL (ref 79–97)
Monocytes Absolute: 0.5 10*3/uL (ref 0.1–0.9)
Monocytes: 6 %
Neutrophils Absolute: 7.1 10*3/uL — ABNORMAL HIGH (ref 1.4–7.0)
Neutrophils: 80 %
Platelets: 204 10*3/uL (ref 150–450)
RBC: 4.05 x10E6/uL (ref 3.77–5.28)
RDW: 12.8 % (ref 11.7–15.4)
RPR Ser Ql: NONREACTIVE
WBC: 8.8 10*3/uL (ref 3.4–10.8)

## 2021-01-14 ENCOUNTER — Other Ambulatory Visit: Payer: Self-pay

## 2021-01-14 ENCOUNTER — Ambulatory Visit
Admission: RE | Admit: 2021-01-14 | Discharge: 2021-01-14 | Disposition: A | Discharge status: Home / Self Care | Payer: Medicaid Other | Source: Ambulatory Visit | Attending: Obstetrics and Gynecology | Admitting: Obstetrics and Gynecology

## 2021-01-14 DIAGNOSIS — Z3689 Encounter for other specified antenatal screening: Secondary | ICD-10-CM | POA: Diagnosis not present

## 2021-01-14 DIAGNOSIS — Z3482 Encounter for supervision of other normal pregnancy, second trimester: Secondary | ICD-10-CM | POA: Diagnosis not present

## 2021-01-14 DIAGNOSIS — Z3A29 29 weeks gestation of pregnancy: Secondary | ICD-10-CM | POA: Diagnosis not present

## 2021-01-20 ENCOUNTER — Encounter: Payer: Self-pay | Admitting: Obstetrics

## 2021-01-20 ENCOUNTER — Ambulatory Visit (INDEPENDENT_AMBULATORY_CARE_PROVIDER_SITE_OTHER): Payer: Medicaid Other | Admitting: Obstetrics

## 2021-01-20 ENCOUNTER — Other Ambulatory Visit: Payer: Self-pay

## 2021-01-20 VITALS — BP 102/60 | Wt 237.0 lb

## 2021-01-20 DIAGNOSIS — Z3480 Encounter for supervision of other normal pregnancy, unspecified trimester: Secondary | ICD-10-CM

## 2021-01-20 DIAGNOSIS — Z3A30 30 weeks gestation of pregnancy: Secondary | ICD-10-CM

## 2021-01-20 LAB — POCT URINALYSIS DIPSTICK OB
Glucose, UA: NEGATIVE
POC,PROTEIN,UA: NEGATIVE

## 2021-01-20 NOTE — Progress Notes (Signed)
Routine Prenatal Care Visit  Subjective  Jody Mooney is a 25 y.o. G2P1001 at [redacted]w[redacted]d being seen today for ongoing prenatal care.  She is currently monitored for the following issues for this high-risk pregnancy due to obesity.  She is doing well. Reports mood is good. Desires delayed cord clamping for at least 1 hour.    ----------------------------------------------------------------------------------- Patient reports no complaints.   Contractions: Not present. Vag. Bleeding: None.  Movement: Present. Leaking Fluid denies.  ----------------------------------------------------------------------------------- The following portions of the patient's history were reviewed and updated as appropriate: allergies, current medications, past family history, past medical history, past social history, past surgical history and problem list. Problem list updated.  Objective  Blood pressure 102/60, weight 237 lb (107.5 kg), last menstrual period 06/19/2020. Pregravid weight 218 lb (98.9 kg) Total Weight Gain 19 lb (8.618 kg) Urinalysis: Urine Protein    Urine Glucose    Fetal Status: Fetal Heart Rate (bpm): 130 Fundal Height: 33 cm Movement: Present     General:  Alert, oriented and cooperative. Patient is in no acute distress.  Skin: Skin is warm and dry. No rash noted.   Cardiovascular: Normal heart rate noted  Respiratory: Normal respiratory effort, no problems with respiration noted  Abdomen: Soft, gravid, appropriate for gestational age.       Pelvic:  Cervical exam deferred        Extremities: Normal range of motion.     Mental Status: Normal mood and affect. Normal behavior. Normal judgment and thought content.    most recent US shows: Assigned GA currently 29 weeks 6 days. Appropriate fetal growth, with EFW currently at 72 %ile. Amniotic fluid volume within normal limits, with AFI 12.1 cm. Normal cervical length. Assessment   25 y.o. G2P1001 at [redacted]w[redacted]d by  03/26/2021, by Last Menstrual  Period presenting for routine prenatal visit  Plan   pregnancy 2 Problems (from 08/18/20 to present)    Problem Noted Resolved   Supervision of normal pregnancy 08/18/2020 by Homero Fellers, MD No   Overview Addendum 01/08/2021 10:07 AM by Homero Fellers, MD     Nursing Staff Provider  Office Location  [facility] Dating  LMP = 8wk [place]  Language  English Anatomy US  complete  Flu Vaccine  Declines Genetic Screen  NIPS: normal xx  TDaP vaccine   01/08/2021 Hgb A1C or  GTT Early : hgba1c 5.3 Third trimester :   Covid Unvaccinated Covid+  12/2019   LAB RESULTS   Rhogam  Not needed Blood Type O/Positive/-- (06/20 1008)   Feeding Plan  Breast Antibody Negative (06/20 1008)  Contraception  condoms Rubella 6.12 (06/20 1008)  Circumcision  RPR Non Reactive (06/20 1008)   Pediatrician   HBsAg Negative (06/20 1008)   Support Person FOB: [name] HIV Non Reactive (06/20 1008)  Prenatal Classes Discussed Varicella Non-immune    GBS  (For PCN allergy, check sensitivities)   BTL Consent     VBAC Consent  Pap  2022 NIL    Hgb Electro   2017 neg    CF      SMA         Obesity in pregnancy: Pregravid BMI 37          Preterm labor symptoms and general obstetric precautions including but not limited to vaginal bleeding, contractions, leaking of fluid and fetal movement were reviewed in detail with the patient. Reminded to start PNV Please refer to After Visit Summary for other counseling recommendations.   Return in  about 2 weeks (around 02/03/2021) for ROB.  Roberto Scales, CNM  [name], [facility]  01/20/21  9:54 AM

## 2021-01-20 NOTE — Progress Notes (Signed)
ROB- no concerns 

## 2021-01-21 ENCOUNTER — Encounter: Payer: Medicaid Other | Admitting: Obstetrics & Gynecology

## 2021-01-25 ENCOUNTER — Encounter: Payer: Self-pay | Admitting: Obstetrics and Gynecology

## 2021-01-25 ENCOUNTER — Other Ambulatory Visit: Payer: Self-pay

## 2021-01-25 ENCOUNTER — Observation Stay
Admission: EM | Admit: 2021-01-25 | Discharge: 2021-01-25 | Disposition: A | Discharge status: Home / Self Care | Payer: Medicaid Other | Attending: Obstetrics and Gynecology | Admitting: Obstetrics and Gynecology

## 2021-01-25 DIAGNOSIS — Z3A31 31 weeks gestation of pregnancy: Secondary | ICD-10-CM | POA: Insufficient documentation

## 2021-01-25 DIAGNOSIS — Z3482 Encounter for supervision of other normal pregnancy, second trimester: Secondary | ICD-10-CM

## 2021-01-25 DIAGNOSIS — O26853 Spotting complicating pregnancy, third trimester: Secondary | ICD-10-CM | POA: Diagnosis not present

## 2021-01-25 LAB — URINALYSIS, COMPLETE (UACMP) WITH MICROSCOPIC
Bilirubin Urine: NEGATIVE
Glucose, UA: NEGATIVE mg/dL
Hgb urine dipstick: NEGATIVE
Ketones, ur: NEGATIVE mg/dL
Nitrite: NEGATIVE
Protein, ur: NEGATIVE mg/dL
Specific Gravity, Urine: 1.025 (ref 1.005–1.030)
pH: 6.5 (ref 5.0–8.0)

## 2021-01-25 LAB — WET PREP, GENITAL
Clue Cells Wet Prep HPF POC: NONE SEEN
Sperm: NONE SEEN
Trich, Wet Prep: NONE SEEN
WBC, Wet Prep HPF POC: <10 — AB (ref <10)
Yeast Wet Prep HPF POC: NONE SEEN

## 2021-01-25 LAB — CHLAMYDIA/NGC RT PCR (ARMC ONLY)
Chlamydia Tr: NOT DETECTED
N gonorrhoeae: NOT DETECTED

## 2021-01-25 NOTE — Discharge Summary (Signed)
Physician Final Progress Note  Patient ID: Jody Mooney MRN: [id number] DOB/AGE: Jul 21, 1995 25 y.o.  Admit date: 01/25/2021 Admitting provider: Will Bonnet, MD Discharge date: 01/25/2021   Admission Diagnoses:  1) intrauterine pregnancy at [redacted]w[redacted]d  2) vaginal bleeding third trimester of pregnancy  Discharge Diagnoses:  1) intrauterine pregnancy at [redacted]w[redacted]d  2) vaginal bleeding third trimester of pregnancy - no evidence of bleeding while in triage  History of Present Illness: The patient is a 25 y.o. female G2P1001 at [redacted]w[redacted]d who presents for vaginal bleeding. She noted a small amount of blood when she wiped after voiding a couple of times today. She has had no frank vaginal bleeding nor any blood on her underwear or garments, only when she wiped. She notes +FM, no LOF, no contractions.     Past Medical History:  Diagnosis Date   Asthma     Past Surgical History:  Procedure Laterality Date   CYSTOSCOPY N/A 05/15/2019   Procedure: CYSTOSCOPY;  Surgeon: Homero Fellers, MD;  Location: [facility] ORS;  Service: Gynecology;  Laterality: N/A;   LAPAROSCOPY  05/15/2019   Procedure: LAPAROSCOPY DIAGNOSTIC WITH RIGHT OOPHORECTOMY AND LEFT OVARIAN CYSTECTOMY;  Surgeon: Homero Fellers, MD;  Location: [facility] ORS;  Service: Gynecology;;   WISDOM TOOTH EXTRACTION      No current facility-administered medications on file prior to encounter.   Current Outpatient Medications on File Prior to Encounter  Medication Sig Dispense Refill   Prenat-FeCbn-FeAsp-Meth-FA-DHA (PRENATE MINI) 18-0.6-0.4-350 MG CAPS Take 1 each by mouth daily. (Patient not taking: Reported on 12/10/2020) 30 capsule 11    Allergies  Allergen Reactions   Vancomycin Itching and Other (See Comments)    Red man syndrome   Penicillins Rash    Has patient had a PCN reaction causing immediate rash, facial/tongue/throat swelling, SOB or lightheadedness with hypotension: Unknown Has patient had a PCN reaction causing  severe rash involving mucus membranes or skin necrosis: Unknown Has patient had a PCN reaction that required hospitalization: Unknown Has patient had a PCN reaction occurring within the last 10 years: no If all of the above answers are "NO", then may proceed with Cephalosporin use.     Social History   Socioeconomic History   Marital status: Single    Spouse name: Not on file   Number of children: Not on file   Years of education: Not on file   Highest education level: Not on file  Occupational History   Occupation: student  Tobacco Use   Smoking status: Never   Smokeless tobacco: Never  Vaping Use   Vaping Use: Never used  Substance and Sexual Activity   Alcohol use: No   Drug use: Never   Sexual activity: Yes    Birth control/protection: None  Other Topics Concern   Not on file  Social History Narrative   Currently attending college.    Feels safe in relationship of 2 years.   Social Determinants of Health   Financial Resource Strain: Not on file  Food Insecurity: Not on file  Transportation Needs: Not on file  Physical Activity: Not on file  Stress: Not on file  Social Connections: Not on file  Intimate Partner Violence: Not on file    Family History  Problem Relation Age of Onset   Hyperlipidemia Mother    Anxiety disorder Other        Grandmother and mother   Depression Other        Grandmother and mother   Hyperlipidemia Other  Diabetes type II Other        Grand parents   Other Other        VSD repaired in grandmother age 2     Review of Systems  Constitutional: Negative.   HENT: Negative.    Eyes: Negative.   Respiratory: Negative.    Cardiovascular: Negative.   Gastrointestinal: Negative.   Genitourinary: Negative.        See HPI  Musculoskeletal: Negative.   Skin: Negative.   Neurological: Negative.   Psychiatric/Behavioral: Negative.      Physical Exam: BP 108/64 (BP Location: Right Arm)   Temp 99.1 F (37.3 C) (Oral)   Resp 16    LMP 06/19/2020   No exam performed by me as the patient was triaged remotely  The RN reported no blood on cotton swabs when she performed wet prep and STI screening.   Consults: None  Significant Findings/ Diagnostic Studies:  Lab Results  Component Value Date   CHLAMYDIA NOT DETECTED 01/25/2021   NGONORRHOEAE NOT DETECTED 01/25/2021   Lab Results  Component Value Date   TRICHWETPREP NONE SEEN 01/25/2021   CLUECESSL NONE SEEN 01/25/2021   WBCWETPREP <10 (A) 01/25/2021   YEASTWETPREP NONE SEEN 01/25/2021    Lab Results  Component Value Date   APPEARANCEUR CLEAR 01/25/2021   GLUCOSEU NEGATIVE 01/25/2021   BILIRUBINUR NEGATIVE 01/25/2021   KETONESUR NEGATIVE 01/25/2021   LABSPEC 1.025 01/25/2021   HGBUR NEGATIVE 01/25/2021   PHURINE 6.5 01/25/2021   NITRITE NEGATIVE 01/25/2021   LEUKOCYTESUR SMALL (A) 01/25/2021   RBCU 0-5 01/25/2021   WBCU 0-5 01/25/2021   BACTERIA RARE (A) 01/25/2021   EPIU 6-10 01/25/2021     Procedures:  NST: Baseline FHR: 135 beats/min Variability: moderate Accelerations: present Decelerations: absent Tocometry: quiet  Interpretation:  INDICATIONS: rule out uterine contractions RESULTS:  A NST procedure was performed with FHR monitoring and a normal baseline established, appropriate time of 20-40 minutes of evaluation, and accels >2 seen w 15x15 characteristics.  Results show a REACTIVE NST.    Hospital Course: The patient was admitted to Labor and Delivery Triage for observation. She had normal vital signs. She had a reassuring/reactive fetal tracing.  All lab studies were negative. She had no evidence of active bleeding on exam. As she was clinically stable with no evidence of bleeding, she was discharged with instructions to return should her bleeding return, in addition to the usual precautions.   Discharge Condition: stable  Disposition: Discharge disposition: 01-Home or Self Care       Diet: Regular diet  Discharge Activity:  Activity as tolerated   Allergies as of 01/25/2021       Reactions   Vancomycin Itching, Other (See Comments)   Red man syndrome   Penicillins Rash   Has patient had a PCN reaction causing immediate rash, facial/tongue/throat swelling, SOB or lightheadedness with hypotension: Unknown Has patient had a PCN reaction causing severe rash involving mucus membranes or skin necrosis: Unknown Has patient had a PCN reaction that required hospitalization: Unknown Has patient had a PCN reaction occurring within the last 10 years: no If all of the above answers are "NO", then may proceed with Cephalosporin use.        Signed: Prentice Docker, MD  01/25/2021, 8:35 PM

## 2021-01-25 NOTE — Progress Notes (Signed)
Patient was reassured that her baby has a CAT 1 strip. Labs were resulted and didn't show any significance. Patient was given the option to stay and see the provider for a pelvic exam or go home and continue to monitor bleeding and do kick counts and return if she has any concerns.

## 2021-01-25 NOTE — OB Triage Note (Addendum)
Pt presents with c/o with bleeding that started on Thursday. She describes it as spotting that's bright red. She is also having some lower abdominal pressure. Baby is still moving and no other risk factors other than increased BMI. Patient reports bleeding is mainly present when she urinates and wipes and she urinates frequently.

## 2021-01-26 ENCOUNTER — Telehealth: Payer: Self-pay

## 2021-01-26 NOTE — Telephone Encounter (Signed)
Transition Care Management Follow-up Telephone Call Date of discharge and from where: 01/25/2021 from [facility] How have you been since you were released from the hospital? Pt stated that she is feeling well and did not have any questions or concerns.  Any questions or concerns? No  Items Reviewed: Did the pt receive and understand the discharge instructions provided? Yes  Medications obtained and verified? Yes  Other? No  Any new allergies since your discharge? No  Dietary orders reviewed? No Do you have support at home? Yes   Functional Questionnaire: (I = Independent and D = Dependent) ADLs: I  Bathing/Dressing- I  Meal Prep- I  Eating- I  Maintaining continence- I  Transferring/Ambulation- I  Managing Meds- I   Follow up appointments reviewed:  PCP Hospital f/u appt confirmed? No   Specialist Hospital f/u appt confirmed? Yes  Scheduled to see Adrian Prows, MD on 02/05/2021 @ 8:30am. Are transportation arrangements needed? No  If their condition worsens, is the pt aware to call PCP or go to the Emergency Dept.? Yes Was the patient provided with contact information for the PCP's office or ED? Yes Was to pt encouraged to call back with questions or concerns? Yes

## 2021-01-27 ENCOUNTER — Telehealth: Payer: Self-pay

## 2021-01-27 ENCOUNTER — Ambulatory Visit (INDEPENDENT_AMBULATORY_CARE_PROVIDER_SITE_OTHER): Payer: Medicaid Other | Admitting: Obstetrics & Gynecology

## 2021-01-27 ENCOUNTER — Other Ambulatory Visit: Payer: Self-pay

## 2021-01-27 VITALS — BP 120/80 | Wt 237.0 lb

## 2021-01-27 DIAGNOSIS — O4693 Antepartum hemorrhage, unspecified, third trimester: Secondary | ICD-10-CM | POA: Diagnosis not present

## 2021-01-27 DIAGNOSIS — Z3483 Encounter for supervision of other normal pregnancy, third trimester: Secondary | ICD-10-CM

## 2021-01-27 NOTE — Progress Notes (Signed)
  Subjective  Fetal Movement? yes Contractions? no Leaking Fluid? no Vaginal Bleeding? Yes - Sunday and today, light    Seen Sunday, normal evaluation then    No pain, ctxs,  Objective  BP 120/80   Wt 237 lb (107.5 kg)   LMP 06/19/2020   BMI 40.68 kg/m  General: NAD Pumonary: no increased work of breathing Abdomen: gravid, non-tender Extremities: no edema Psychiatric: mood appropriate, affect full SVE- cl/30/-3    Light blood at cervix Assessment  25 y.o. G2P1001 at [redacted]w[redacted]d by  03/26/2021, by Last Menstrual Period presenting for routine prenatal visit  Plan   Problem List Items Addressed This Visit      Other   Supervision of normal pregnancy - Primary  Other Visit Diagnoses    Third trimester bleeding       Relevant Orders   Fetal fibronectin    fFN for PTL Modified rest and full pelvic rest Weekly checks [place] if persists (last [place] 2 weeks ago normal cervix and placenta) PNV, [place] daily PTL pecautions Patient has been counseled as to the risks of prematurity, including risks of respiratory depression or distress, jaundice, feeding or temperature regulation problems, neurologic concerns including hearing or visual problems, and brain complications.  Patient understands the risks of tocolytic therapies along with their inherent failure rates, and the reasons for and against their use in individual circumstances.  Consider BMZ if fFN pos.  pregnancy 2 Problems (from 08/18/20 to present)    Problem Noted Resolved   Supervision of normal pregnancy 08/18/2020 by Homero Fellers, MD No   Overview Addendum 01/08/2021 10:07 AM by Homero Fellers, MD     Nursing Staff Provider  Office Location  [facility] Dating  LMP = 8wk [place]  Language  English Anatomy US  complete  Flu Vaccine  Declines Genetic Screen  NIPS: normal xx  TDaP vaccine   01/08/2021 Hgb A1C or  GTT Early : hgba1c 5.3 Third trimester :   Covid Unvaccinated Covid+  12/2019   LAB RESULTS   Rhogam  Not  needed Blood Type O/Positive/-- (06/20 1008)   Feeding Plan  Breast Antibody Negative (06/20 1008)  Contraception  condoms Rubella 6.12 (06/20 1008)  Circumcision  RPR Non Reactive (06/20 1008)   Pediatrician   HBsAg Negative (06/20 1008)   Support Person FOB: [name] HIV Non Reactive (06/20 1008)  Prenatal Classes Discussed Varicella Non-immune    GBS  (For PCN allergy, check sensitivities)   BTL Consent     VBAC Consent  Pap  2022 NIL    Hgb Electro   2017 neg    CF      SMA         Obesity in pregnancy: Pregravid BMI 37          Barnett Applebaum, MD, [place], [facility] 01/27/2021  4:46 PM

## 2021-01-27 NOTE — Telephone Encounter (Signed)
Patient calling stating bleeding has started again. Seen @ ED 01/25/21 for third trimester bleeding. Appt today with RPH (3:50).

## 2021-01-28 ENCOUNTER — Telehealth: Payer: Self-pay

## 2021-01-28 ENCOUNTER — Other Ambulatory Visit: Payer: Self-pay | Admitting: Obstetrics & Gynecology

## 2021-01-28 LAB — FETAL FIBRONECTIN: Fetal Fibronectin: NEGATIVE

## 2021-01-28 NOTE — Progress Notes (Signed)
Pt aware she is not in preterm labor

## 2021-01-28 NOTE — Telephone Encounter (Signed)
Pt calling to speak with JP again, fwding to jess

## 2021-01-28 NOTE — Telephone Encounter (Signed)
I adjusted the note to read correctly, that she is Not at risk for PTL at this time, and to continue to monitor for worsening bleeding or pain.

## 2021-01-28 NOTE — Telephone Encounter (Signed)
Pt wants to know if you can send her a message on why she's bleeding? Is it because her baby is measuring 2 weeks bigger and her cervix is softer? Also if still bleeding at 35 weeks are you going to induce her?

## 2021-01-28 NOTE — Progress Notes (Signed)
Call results (neg) and let her know.   'This test is negative and thus reassuring that you are in active preterm labor mode.  Continue to monitor for any worsening pain or bleeding.'

## 2021-02-04 ENCOUNTER — Other Ambulatory Visit: Payer: Self-pay

## 2021-02-04 ENCOUNTER — Ambulatory Visit (INDEPENDENT_AMBULATORY_CARE_PROVIDER_SITE_OTHER): Payer: Medicaid Other | Admitting: Obstetrics

## 2021-02-04 VITALS — BP 126/80 | Wt 240.0 lb

## 2021-02-04 DIAGNOSIS — O99210 Obesity complicating pregnancy, unspecified trimester: Secondary | ICD-10-CM

## 2021-02-04 DIAGNOSIS — Z3483 Encounter for supervision of other normal pregnancy, third trimester: Secondary | ICD-10-CM

## 2021-02-04 DIAGNOSIS — Z3A32 32 weeks gestation of pregnancy: Secondary | ICD-10-CM

## 2021-02-04 NOTE — Progress Notes (Signed)
Routine Prenatal Care Visit  Subjective  Jody Mooney is a 25 y.o. G2P1001 at [redacted]w[redacted]d being seen today for ongoing prenatal care.  She is currently monitored for the following issues for this low-risk pregnancy and has Encounter for general adult medical examination with abnormal findings; Class 2 obesity due to excess calories without serious comorbidity with body mass index (BMI) of 35.0 to 35.9 in adult; Chest pain; Stressful job; Supervision of normal pregnancy; and Indication for care in labor or delivery on their problem list.  ----------------------------------------------------------------------------------- Patient reports no complaints.   Contractions: Not present. Vag. Bleeding: None.  Movement: Present. Leaking Fluid denies.  ----------------------------------------------------------------------------------- The following portions of the patient's history were reviewed and updated as appropriate: allergies, current medications, past family history, past medical history, past social history, past surgical history and problem list. Problem list updated.  Objective  Blood pressure 126/80, weight 240 lb (108.9 kg), last menstrual period 06/19/2020. Pregravid weight 218 lb (98.9 kg) Total Weight Gain 22 lb (9.979 kg) Urinalysis: Urine Protein    Urine Glucose    Fetal Status:     Movement: Present     General:  Alert, oriented and cooperative. Patient is in no acute distress.  Skin: Skin is warm and dry. No rash noted.   Cardiovascular: Normal heart rate noted  Respiratory: Normal respiratory effort, no problems with respiration noted  Abdomen: Soft, gravid, appropriate for gestational age. Pain/Pressure: Present     Pelvic:  Cervical exam deferred        Extremities: Normal range of motion.     Mental Status: Normal mood and affect. Normal behavior. Normal judgment and thought content.   Assessment   25 y.o. G2P1001 at [redacted]w[redacted]d by  03/26/2021, by Last Menstrual Period presenting  for routine prenatal visit  Plan   pregnancy 2 Problems (from 08/18/20 to present)    Problem Noted Resolved   Supervision of normal pregnancy 08/18/2020 by Homero Fellers, MD No   Overview Addendum 01/08/2021 10:07 AM by Homero Fellers, MD     Nursing Staff Provider  Office Location  [facility] Dating  LMP = 8wk [place]  Language  English Anatomy US  complete  Flu Vaccine  Declines Genetic Screen  NIPS: normal xx  TDaP vaccine   01/08/2021 Hgb A1C or  GTT Early : hgba1c 5.3 Third trimester :   Covid Unvaccinated Covid+  12/2019   LAB RESULTS   Rhogam  Not needed Blood Type O/Positive/-- (06/20 1008)   Feeding Plan  Breast Antibody Negative (06/20 1008)  Contraception  condoms Rubella 6.12 (06/20 1008)  Circumcision  RPR Non Reactive (06/20 1008)   Pediatrician   HBsAg Negative (06/20 1008)   Support Person FOB: [name] HIV Non Reactive (06/20 1008)  Prenatal Classes Discussed Varicella Non-immune    GBS  (For PCN allergy, check sensitivities)   BTL Consent     VBAC Consent  Pap  2022 NIL    Hgb Electro   2017 neg    CF      SMA         Obesity in pregnancy: Pregravid BMI 37          Preterm labor symptoms and general obstetric precautions including but not limited to vaginal bleeding, contractions, leaking of fluid and fetal movement were reviewed in detail with the patient. Please refer to After Visit Summary for other counseling recommendations.  Will order another growth scan for 36 weeks  (higher BMI) Discussed delayed cord clamping as a  routine practice by our providers.  Return in about 2 weeks (around 02/18/2021) for return OB.  Imagene Riches, CNM  02/04/2021 1:39 PM

## 2021-02-04 NOTE — Progress Notes (Signed)
No vb. No lof.  

## 2021-02-05 ENCOUNTER — Encounter: Payer: Medicaid Other | Admitting: Obstetrics and Gynecology

## 2021-02-06 ENCOUNTER — Telehealth: Payer: Self-pay

## 2021-02-06 NOTE — Telephone Encounter (Signed)
Pt calling; does she need approval to travel to [facility]?  Her grandmother is on life support.  [phone]  Pt states she was going to try to get a flight since she will be uncomfortable riding in a car for that long.  Adv she can travel; to get up every hour or so to walk around; stay hydrated.  Pt asked if she needs a note; adv the airline will let her know; to get back with me if she needs a note.  Pt mentioned riding that long would also make her nauseated; adv to get her some ginger drops as ginger is good for nausea.

## 2021-02-12 ENCOUNTER — Observation Stay
Admission: EM | Admit: 2021-02-12 | Discharge: 2021-02-12 | Disposition: A | Discharge status: Home / Self Care | Payer: Medicaid Other | Attending: Obstetrics & Gynecology | Admitting: Obstetrics & Gynecology

## 2021-02-12 ENCOUNTER — Encounter: Payer: Self-pay | Admitting: Obstetrics & Gynecology

## 2021-02-12 ENCOUNTER — Other Ambulatory Visit: Payer: Self-pay

## 2021-02-12 ENCOUNTER — Telehealth: Payer: Self-pay

## 2021-02-12 DIAGNOSIS — O26893 Other specified pregnancy related conditions, third trimester: Secondary | ICD-10-CM

## 2021-02-12 DIAGNOSIS — N898 Other specified noninflammatory disorders of vagina: Secondary | ICD-10-CM | POA: Diagnosis present

## 2021-02-12 DIAGNOSIS — O23593 Infection of other part of genital tract in pregnancy, third trimester: Principal | ICD-10-CM | POA: Insufficient documentation

## 2021-02-12 DIAGNOSIS — O99513 Diseases of the respiratory system complicating pregnancy, third trimester: Secondary | ICD-10-CM | POA: Insufficient documentation

## 2021-02-12 DIAGNOSIS — J45909 Unspecified asthma, uncomplicated: Secondary | ICD-10-CM | POA: Insufficient documentation

## 2021-02-12 DIAGNOSIS — Z3483 Encounter for supervision of other normal pregnancy, third trimester: Secondary | ICD-10-CM

## 2021-02-12 DIAGNOSIS — Z3A34 34 weeks gestation of pregnancy: Secondary | ICD-10-CM | POA: Diagnosis not present

## 2021-02-12 MED ORDER — ACETAMINOPHEN 325 MG PO TABS: 650.0000 mg | ORAL_TABLET | ORAL | Status: DC | PRN

## 2021-02-12 MED ORDER — LIDOCAINE HCL (PF) 1 % IJ SOLN: 30.0000 mL | INTRAMUSCULAR | Status: DC | PRN

## 2021-02-12 MED ORDER — ONDANSETRON HCL 4 MG/2ML IJ SOLN: 4.0000 mg | Freq: Four times a day (QID) | INTRAMUSCULAR | Status: DC | PRN

## 2021-02-12 NOTE — Final Progress Note (Signed)
Physician Final Progress Note  Patient ID: Jody Mooney MRN: [id number] DOB/AGE: [birth year] 24 y.o.  Admit date: 02/12/2021 Admitting provider: Gae Dry, MD Discharge date: 02/12/2021  Admission Diagnoses: Vaginal discharge  34 weeks Pregnancy  Discharge Diagnoses:  Principal Problem:   Vaginal discharge  34 weeks [regnancy  Consults: None  Significant Findings/ Diagnostic Studies:  Obstetrics Admission History & Physical   Vaginal Discharge (Unsure if discharger or ROM)   HPI:  25 y.o. G2P1001 @ [redacted]w[redacted]d (03/26/2021, by Last Menstrual Period). Admitted on 02/12/2021:   Patient Active Problem List   Diagnosis Date Noted   Indication for care in labor or delivery 01/25/2021   Supervision of normal pregnancy 08/18/2020   Class 2 obesity due to excess calories without serious comorbidity with body mass index (BMI) of 35.0 to 35.9 in adult 04/17/2019   Chest pain 04/17/2019   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   Vaginal discharge 11/08/2017   Encounter for general adult medical examination with abnormal findings 11/03/2011     Presents for vaginal discharge and episode of spotting.  This has happened off an don for the last 10 days.  No Pain.   [facility] at: at [facility]. Pregnancy complicated by  Obesity Prior NSVD 2 yrs ago.  ROS: A review of systems was performed and negative, except as stated in the above HPI. PMHx:  Past Medical History:  Diagnosis Date   Asthma    PSHx:  Past Surgical History:  Procedure Laterality Date   CYSTOSCOPY N/A 05/15/2019   Procedure: CYSTOSCOPY;  Surgeon: Homero Fellers, MD;  Location: [facility] ORS;  Service: Gynecology;  Laterality: N/A;   LAPAROSCOPY  05/15/2019   Procedure: LAPAROSCOPY DIAGNOSTIC WITH RIGHT OOPHORECTOMY AND LEFT OVARIAN CYSTECTOMY;  Surgeon: Homero Fellers, MD;  Location: [facility] ORS;  Service: Gynecology;;   WISDOM TOOTH EXTRACTION     Medications:  Medications Prior to Admission  Medication Sig Dispense  Refill Last Dose   Prenat-FeCbn-FeAsp-Meth-FA-DHA (PRENATE MINI) 18-0.6-0.4-350 MG CAPS Take 1 each by mouth daily. 30 capsule 11 02/12/2021   Allergies: is allergic to vancomycin and penicillins. OBHx:  OB History  Gravida Para Term Preterm AB Living  2 1 1     1   SAB IAB Ectopic Multiple Live Births        0 1    # Outcome Date GA Lbr Len/2nd Weight Sex Delivery Anes PTL Lv  2 Current           1 Term 03/04/18 [redacted]w[redacted]d 07:26 / 00:12 2994 g F Vag-Spont None  LIV   HEN:IDPOEUMP/NTIRWERXVQMG except as detailed in HPI.[name]  No family history of birth defects. Soc Hx: Alcohol: none and Recreational drug use: none  Objective:   Vitals:   02/12/21 2156 02/12/21 2202  BP: 124/71   Pulse: 89   Resp:  17   Constitutional: Well nourished, well developed female in no acute distress.  HEENT: normal Skin: Warm and dry.  Cardiovascular:Regular rate and rhythm.   Extremity: trace to 1+ bilateral pedal edema Respiratory: Clear to auscultation bilateral. Normal respiratory effort Abdomen: gravid, ND, FHT present, without guarding, without rebound tenderness on exam Back: no CVAT Neuro: DTRs 2+, Cranial nerves grossly intact Psych: Alert and Oriented x3. No memory deficits. Normal mood and affect.  MS: normal gait, normal bilateral lower extremity ROM/strength/stability.  Pelvic exam: is not limited by body habitus EGBUS: within normal limits Vagina: within normal limits and with normal mucosa, no Blood Cervix: SSE- no pooling NEG FERNING SVE: closed,  thick , high  Procedures: A NST procedure was performed with FHR monitoring and a normal baseline established, appropriate time of 20-40 minutes of evaluation, and accels >2 seen w 15x15 characteristics.  Results show a REACTIVE NST.   A/P: Vaginal discharge No s/sx PTL, Bleeding, Infection Pelvic rest and monitoring  Discharge Condition: good  Disposition:  Discharge disposition: 01-Home or Self Care      Diet: Regular  diet  Discharge Activity: Activity as tolerated  Discharge Instructions     Call MD for:   Complete by: As directed    Worsening contractions or pain; leakage of fluid; bleeding.   Diet - low sodium heart healthy   Complete by: As directed    Increase activity slowly   Complete by: As directed       Allergies as of 02/12/2021       Reactions   Vancomycin Itching, Other (See Comments)   Red man syndrome   Penicillins Rash   Has patient had a PCN reaction causing immediate rash, facial/tongue/throat swelling, SOB or lightheadedness with hypotension: Unknown Has patient had a PCN reaction causing severe rash involving mucus membranes or skin necrosis: Unknown Has patient had a PCN reaction that required hospitalization: Unknown Has patient had a PCN reaction occurring within the last 10 years: no If all of the above answers are "NO", then may proceed with Cephalosporin use.        Medication List     TAKE these medications    Prenate Mini 18-0.6-0.4-350 MG Caps Take 1 each by mouth daily.        [place]. Go in 1 week(s).   Specialty: Obstetrics and Gynecology Why: As Scheduled Contact information: [address] [place] [address] [phone]                Total time spent taking care of this patient: 20 minutes  Signed: Hoyt Koch 02/12/2021, 11:13 PM

## 2021-02-12 NOTE — OB Triage Note (Signed)
Pt arrived to unit with c/o increase discharge and vaginal leakage x7 days and is unsure if it's mucus or amniotic fluid. Pt notes decreased fetal movement and lower abdominal pressure and "braxton hicks" contractions. Continue to assess.

## 2021-02-12 NOTE — Telephone Encounter (Signed)
Pt left msg on triage saying she believes she is leaking fluid. She just returned from out of state trip and while she was on her trip she was having vag pressure and thinks she's been leaking fluid since then. Baby movement has decreased as well. Called pt back, says the leaking started this past Sat 12/10, has been changing twice a day. Denies pain at the moment. It was painful/discomfort while sitting in car. Advised to go to hospital.

## 2021-02-12 NOTE — Discharge Summary (Signed)
See FPN

## 2021-02-13 ENCOUNTER — Telehealth: Payer: Self-pay

## 2021-02-13 NOTE — Telephone Encounter (Signed)
Transition Care Management Follow-up Telephone Call Date of discharge and from where: 02/12/2021-[facility] How have you been since you were released from the hospital? Patient stated she is doing fine. Any questions or concerns? No  Items Reviewed: Did the pt receive and understand the discharge instructions provided? Yes  Medications obtained and verified? Yes  Other? No  Any new allergies since your discharge? No  Dietary orders reviewed? No Do you have support at home? Yes   Home Care and Equipment/Supplies: Were home health services ordered? not applicable If so, what is the name of the agency? N/A  Has the agency set up a time to come to the patient's home? not applicable Were any new equipment or medical supplies ordered?  No What is the name of the medical supply agency? N/A Were you able to get the supplies/equipment? not applicable Do you have any questions related to the use of the equipment or supplies? No  Functional Questionnaire: (I = Independent and D = Dependent) ADLs: I  Bathing/Dressing- I  Meal Prep- I  Eating- I  Maintaining continence- I  Transferring/Ambulation- I  Managing Meds- I  Follow up appointments reviewed:  PCP Hospital f/u appt confirmed? No   Specialist Hospital f/u appt confirmed? Yes  Scheduled to see OBGYN on 02/19/2021 @ 9:50am. Are transportation arrangements needed? No  If their condition worsens, is the pt aware to call PCP or go to the Emergency Dept.? Yes Was the patient provided with contact information for the PCP's office or ED? Yes Was to pt encouraged to call back with questions or concerns? Yes

## 2021-02-17 ENCOUNTER — Telehealth: Payer: Self-pay

## 2021-02-17 NOTE — Telephone Encounter (Signed)
Called pt to inform her of FMLA forms and payment to start process. No answer, LM. Forms up front

## 2021-02-19 ENCOUNTER — Ambulatory Visit (INDEPENDENT_AMBULATORY_CARE_PROVIDER_SITE_OTHER): Payer: Medicaid Other | Admitting: Obstetrics and Gynecology

## 2021-02-19 ENCOUNTER — Other Ambulatory Visit: Payer: Self-pay

## 2021-02-19 ENCOUNTER — Encounter: Payer: Self-pay | Admitting: Obstetrics and Gynecology

## 2021-02-19 VITALS — BP 120/70 | Wt 230.0 lb

## 2021-02-19 DIAGNOSIS — Z3A35 35 weeks gestation of pregnancy: Secondary | ICD-10-CM

## 2021-02-19 DIAGNOSIS — Z3483 Encounter for supervision of other normal pregnancy, third trimester: Secondary | ICD-10-CM

## 2021-02-19 NOTE — Patient Instructions (Signed)

## 2021-02-19 NOTE — Progress Notes (Signed)
Routine Prenatal Care Visit  Subjective  Jody Mooney is a 25 y.o. G2P1001 at [redacted]w[redacted]d being seen today for ongoing prenatal care.  She is currently monitored for the following issues for this low-risk pregnancy and has Encounter for general adult medical examination with abnormal findings; Vaginal discharge; Class 2 obesity due to excess calories without serious comorbidity with body mass index (BMI) of 35.0 to 35.9 in adult; Chest pain; Stressful job; Supervision of normal pregnancy; and Indication for care in labor or delivery on their problem list.  ----------------------------------------------------------------------------------- Patient reports no complaints.   Contractions: Irregular. Vag. Bleeding: None.  Movement: Present. Denies leaking of fluid.  ----------------------------------------------------------------------------------- The following portions of the patient's history were reviewed and updated as appropriate: allergies, current medications, past family history, past medical history, past social history, past surgical history and problem list. Problem list updated.   Objective  Blood pressure 120/70, weight 230 lb (104.3 kg), last menstrual period 06/19/2020. Pregravid weight 218 lb (98.9 kg) Total Weight Gain 12 lb (5.443 kg) Urinalysis:      Fetal Status: Fetal Heart Rate (bpm): 145 Fundal Height: 38 cm Movement: Present     General:  Alert, oriented and cooperative. Patient is in no acute distress.  Skin: Skin is warm and dry. No rash noted.   Cardiovascular: Normal heart rate noted  Respiratory: Normal respiratory effort, no problems with respiration noted  Abdomen: Soft, gravid, appropriate for gestational age. Pain/Pressure: Present     Pelvic:  Cervical exam deferred        Extremities: Normal range of motion.  Edema: None  Mental Status: Normal mood and affect. Normal behavior. Normal judgment and thought content.     Assessment   24 y.o. G2P1001 at  [redacted]w[redacted]d by  03/26/2021, by Last Menstrual Period presenting for routine prenatal visit  Plan   pregnancy 2 Problems (from 08/18/20 to present)     Problem Noted Resolved   Supervision of normal pregnancy 08/18/2020 by Homero Fellers, MD No   Overview Addendum 02/19/2021 10:24 AM by Homero Fellers, MD     Nursing Staff Provider  Office Location  [facility] Dating  LMP = 8wk [place]  Language  English Anatomy US  complete  Flu Vaccine  Declines Genetic Screen  NIPS: normal xx  TDaP vaccine   01/08/2021 Hgb A1C or  GTT Early : hgba1c 5.3 Third trimester : 132  Covid Unvaccinated Covid+  12/2019   LAB RESULTS   Rhogam  Not needed Blood Type O/Positive/-- (06/20 1008)   Feeding Plan  Breast Antibody Negative (06/20 1008)  Contraception  condoms Rubella 6.12 (06/20 1008)  Circumcision  RPR Non Reactive (06/20 1008)   Pediatrician   HBsAg Negative (06/20 1008)   Support Person FOB: [name] HIV Non Reactive (06/20 1008)  Prenatal Classes Discussed Varicella Non-immune    GBS  (For PCN allergy, check sensitivities)   BTL Consent     VBAC Consent  Pap  2022 NIL    Hgb Electro   2017 neg    CF      SMA        Obesity in pregnancy: Pregravid BMI 37          Has growth [place] scheduled   Gestational age appropriate obstetric precautions including but not limited to vaginal bleeding, contractions, leaking of fluid and fetal movement were reviewed in detail with the patient.    Return in about 1 week (around 02/26/2021) for Weekly for 5 weeks.  Bich Mchaney  [place], [facility] 02/19/2021, 10:24 AM

## 2021-02-26 ENCOUNTER — Other Ambulatory Visit: Payer: Self-pay

## 2021-02-26 ENCOUNTER — Ambulatory Visit (INDEPENDENT_AMBULATORY_CARE_PROVIDER_SITE_OTHER): Payer: Medicaid Other | Admitting: Licensed Practical Nurse

## 2021-02-26 VITALS — BP 124/74 | Wt 235.0 lb

## 2021-02-26 DIAGNOSIS — Z113 Encounter for screening for infections with a predominantly sexual mode of transmission: Secondary | ICD-10-CM | POA: Diagnosis not present

## 2021-02-26 DIAGNOSIS — Z3480 Encounter for supervision of other normal pregnancy, unspecified trimester: Secondary | ICD-10-CM

## 2021-02-26 DIAGNOSIS — Z3A36 36 weeks gestation of pregnancy: Secondary | ICD-10-CM

## 2021-02-26 DIAGNOSIS — Z3685 Encounter for antenatal screening for Streptococcus B: Secondary | ICD-10-CM | POA: Diagnosis not present

## 2021-02-26 NOTE — Progress Notes (Signed)
Routine Prenatal Care Visit  Subjective  Jody Mooney is a 25 y.o. G2P1001 at [redacted]w[redacted]d being seen today for ongoing prenatal care.  She is currently monitored for the following issues for this low-risk pregnancy and has Encounter for general adult medical examination with abnormal findings; Vaginal discharge; Class 2 obesity due to excess calories without serious comorbidity with body mass index (BMI) of 35.0 to 35.9 in adult; Chest pain; Stressful job; Supervision of normal pregnancy; and Indication for care in labor or delivery on their problem list.  ----------------------------------------------------------------------------------- Patient reports no complaints.  Here with partner and daughter.  Has growth [place] next week.  Contractions: Not present. Vag. Bleeding: None.  Movement: Present. Leaking Fluid denies.  ----------------------------------------------------------------------------------- The following portions of the patient's history were reviewed and updated as appropriate: allergies, current medications, past family history, past medical history, past social history, past surgical history and problem list. Problem list updated.  Objective  Blood pressure 124/74, weight 235 lb (106.6 kg), last menstrual period 06/19/2020. Pregravid weight 218 lb (98.9 kg) Total Weight Gain 17 lb (7.711 kg) Urinalysis: Urine Protein    Urine Glucose    Fetal Status: Fetal Heart Rate (bpm): 140   Movement: Present     General:  Alert, oriented and cooperative. Patient is in no acute distress.  Skin: Skin is warm and dry. No rash noted.   Cardiovascular: Normal heart rate noted  Respiratory: Normal respiratory effort, no problems with respiration noted  Abdomen: Soft, gravid, appropriate for gestational age. Pain/Pressure: Present     Pelvic:  Cervical exam deferred        Extremities: Normal range of motion.     Mental Status: Normal mood and affect. Normal behavior. Normal judgment and thought  content.   Assessment   25 y.o. G2P1001 at [redacted]w[redacted]d by  03/26/2021, by Last Menstrual Period presenting for routine prenatal visit  Plan   pregnancy 2 Problems (from 08/18/20 to present)     Problem Noted Resolved   Supervision of normal pregnancy 08/18/2020 by Homero Fellers, MD No   Overview Addendum 02/26/2021  2:40 PM by Allen Derry, CNM     Nursing Staff Provider  Office Location  [facility] Dating  LMP = 8wk [place]  Language  English Anatomy US  complete  Flu Vaccine  Declines Genetic Screen  NIPS: normal xx  TDaP vaccine   01/08/2021 Hgb A1C or  GTT Early : hgba1c 5.3 Third trimester : 132  Covid Unvaccinated Covid+  12/2019   LAB RESULTS   Rhogam  Not needed Blood Type O/Positive/-- (06/20 1008)   Feeding Plan  Breast Antibody Negative (06/20 1008)  Contraception  condoms Rubella 6.12 (06/20 1008)  Circumcision NA RPR Non Reactive (06/20 1008)   Pediatrician   HBsAg Negative (06/20 1008)   Support Person FOB: [name] HIV Non Reactive (06/20 1008)  Prenatal Classes Discussed Varicella Non-immune    GBS  (For PCN allergy, check sensitivities)   BTL Consent     VBAC Consent  Pap  2022 NIL    Hgb Electro   2017 neg    CF      SMA         Obesity in pregnancy: Pregravid BMI 37           Term labor symptoms and general obstetric precautions including but not limited to vaginal bleeding, contractions, leaking of fluid and fetal movement were reviewed in detail with the patient. Please refer to After Visit Summary for other counseling  recommendations.   GBS and gc/ct collected   RTC 1 week  Roberto Scales, [place], [facility]  02/26/21  2:46 PM

## 2021-02-26 NOTE — Progress Notes (Signed)
No vb. No lof. GBS today.

## 2021-03-01 NOTE — L&D Delivery Note (Signed)
Delivery Note At 12:08 PM a viable female was delivered via Vaginal, Spontaneous (Presentation: Right Occiput Posterior).  APGAR: 7, 9; weight  pending.   Placenta status: Spontaneous, Intact.  Cord: 3 vessels with the following complications: None.  Cord pH: N/A  Anesthesia: None Episiotomy: None Lacerations: None Suture Repair:  none Est. Blood Loss (mL):  313mL  Mom to postpartum.  Baby to Couplet care / Skin to Skin.  [name] 03/20/2021, 12:23 PM

## 2021-03-02 LAB — GC/CHLAMYDIA PROBE AMP
Chlamydia trachomatis, NAA: NEGATIVE
Neisseria Gonorrhoeae by PCR: NEGATIVE

## 2021-03-03 ENCOUNTER — Encounter: Payer: Self-pay | Admitting: Licensed Practical Nurse

## 2021-03-03 LAB — STREP GP B SUSCEPTIBILITY

## 2021-03-03 LAB — STREP GP B CULTURE+RFLX: Strep Gp B Culture+Rflx: POSITIVE — AB

## 2021-03-04 ENCOUNTER — Encounter: Payer: Self-pay | Admitting: Licensed Practical Nurse

## 2021-03-05 ENCOUNTER — Ambulatory Visit
Admission: RE | Admit: 2021-03-05 | Discharge: 2021-03-05 | Disposition: A | Discharge status: Home / Self Care | Payer: Medicaid Other | Source: Ambulatory Visit | Attending: Obstetrics | Admitting: Obstetrics

## 2021-03-05 ENCOUNTER — Other Ambulatory Visit: Payer: Self-pay

## 2021-03-05 DIAGNOSIS — Z3483 Encounter for supervision of other normal pregnancy, third trimester: Secondary | ICD-10-CM | POA: Diagnosis present

## 2021-03-05 DIAGNOSIS — Z3493 Encounter for supervision of normal pregnancy, unspecified, third trimester: Secondary | ICD-10-CM | POA: Diagnosis not present

## 2021-03-05 DIAGNOSIS — O99213 Obesity complicating pregnancy, third trimester: Secondary | ICD-10-CM | POA: Diagnosis not present

## 2021-03-05 DIAGNOSIS — Z3A36 36 weeks gestation of pregnancy: Secondary | ICD-10-CM | POA: Diagnosis not present

## 2021-03-05 DIAGNOSIS — O99210 Obesity complicating pregnancy, unspecified trimester: Secondary | ICD-10-CM | POA: Diagnosis present

## 2021-03-06 ENCOUNTER — Encounter: Payer: Self-pay | Admitting: Licensed Practical Nurse

## 2021-03-06 ENCOUNTER — Ambulatory Visit (INDEPENDENT_AMBULATORY_CARE_PROVIDER_SITE_OTHER): Payer: Medicaid Other | Admitting: Licensed Practical Nurse

## 2021-03-06 VITALS — BP 118/68 | Wt 241.0 lb

## 2021-03-06 DIAGNOSIS — Z3480 Encounter for supervision of other normal pregnancy, unspecified trimester: Secondary | ICD-10-CM

## 2021-03-06 DIAGNOSIS — Z3A37 37 weeks gestation of pregnancy: Secondary | ICD-10-CM

## 2021-03-06 LAB — POCT URINALYSIS DIPSTICK OB
Glucose, UA: NEGATIVE
POC,PROTEIN,UA: NEGATIVE

## 2021-03-06 NOTE — Progress Notes (Signed)
ROB - contractions 03/05/21 2-3 mins apart that lasted consistently for 45 mins. RM 5

## 2021-03-06 NOTE — Progress Notes (Signed)
Routine Prenatal Care Visit  Subjective  Jody Mooney is a 26 y.o. G2P1001 at [redacted]w[redacted]d being seen today for ongoing prenatal care.  She is currently monitored for the following issues for this low-risk pregnancy and has Encounter for general adult medical examination with abnormal findings; Vaginal discharge; Class 2 obesity due to excess calories without serious comorbidity with body mass index (BMI) of 35.0 to 35.9 in adult; Chest pain; Stressful job; Supervision of normal pregnancy; and Indication for care in labor or delivery on their problem list.  ----------------------------------------------------------------------------------- Patient reports Here with daughter, had a run of contractions yesterday that lasted 45 mins, then went away. No concerns today.  Reports she was given Vancomycin during her last labor, with that she turned beet res and had itching.  Reports when she was about 3 she was given PCN, had a rash, swelling and breathing difficulty her mom took her the ED for treatment, she has not take PCN since.  Reviewed GBS +, will need to consult for what to treat in labor given allergies and clinda resistant strain.  Contractions: Irregular.  .  Movement: Present. Leaking Fluid denies.  ----------------------------------------------------------------------------------- The following portions of the patient's history were reviewed and updated as appropriate: allergies, current medications, past family history, past medical history, past social history, past surgical history and problem list. Problem list updated.  Objective  Blood pressure 118/68, weight 241 lb (109.3 kg), last menstrual period 06/19/2020. Pregravid weight 218 lb (98.9 kg) Total Weight Gain 23 lb (10.4 kg) Urinalysis: Urine Protein    Urine Glucose    Fetal Status: Fetal Heart Rate (bpm): 140 Fundal Height: 39 cm Movement: Present  Presentation: Vertex  General:  Alert, oriented and cooperative. Patient is in no acute  distress.  Skin: Skin is warm and dry. No rash noted.   Cardiovascular: Normal heart rate noted  Respiratory: Normal respiratory effort, no problems with respiration noted  Abdomen: Soft, gravid, appropriate for gestational age. Pain/Pressure: Absent     Pelvic:  Cervical exam performed Dilation: 1 Effacement (%): 40 Station: -3  Extremities: Normal range of motion.  Edema: None  Mental Status: Normal mood and affect. Normal behavior. Normal judgment and thought content.   Assessment   26 y.o. G2P1001 at [redacted]w[redacted]d by  03/26/2021, by Last Menstrual Period presenting for routine prenatal visit  Plan   pregnancy 2 Problems (from 08/18/20 to present)     Problem Noted Resolved   Supervision of normal pregnancy 08/18/2020 by Homero Fellers, MD No   Overview Addendum 02/26/2021  2:40 PM by Allen Derry, CNM     Nursing Staff Provider  Office Location  [facility] Dating  LMP = 8wk [place]  Language  English Anatomy US  complete  Flu Vaccine  Declines Genetic Screen  NIPS: normal xx  TDaP vaccine   01/08/2021 Hgb A1C or  GTT Early : hgba1c 5.3 Third trimester : 132  Covid Unvaccinated Covid+  12/2019   LAB RESULTS   Rhogam  Not needed Blood Type O/Positive/-- (06/20 1008)   Feeding Plan  Breast Antibody Negative (06/20 1008)  Contraception  condoms Rubella 6.12 (06/20 1008)  Circumcision NA RPR Non Reactive (06/20 1008)   Pediatrician   HBsAg Negative (06/20 1008)   Support Person FOB: Jody Mooney HIV Non Reactive (06/20 1008)  Prenatal Classes Discussed Varicella Non-immune    GBS  (For PCN allergy, check sensitivities)   BTL Consent     VBAC Consent  Pap  2022 NIL  Hgb Electro   2017 neg    CF      SMA         Obesity in pregnancy: Pregravid BMI 37           Term labor symptoms and general obstetric precautions including but not limited to vaginal bleeding, contractions, leaking of fluid and fetal movement were reviewed in detail with the patient. Please refer to After  Visit Summary for other counseling recommendations.   Return in about 1 week (around 03/13/2021) for ROB.  Roberto Scales, CNM  [name], [facility]  03/06/21  9:56 AM

## 2021-03-12 ENCOUNTER — Encounter: Payer: Self-pay | Admitting: Licensed Practical Nurse

## 2021-03-12 ENCOUNTER — Other Ambulatory Visit: Payer: Self-pay

## 2021-03-12 ENCOUNTER — Ambulatory Visit (INDEPENDENT_AMBULATORY_CARE_PROVIDER_SITE_OTHER): Payer: Medicaid Other | Admitting: Licensed Practical Nurse

## 2021-03-12 VITALS — BP 120/80 | Wt 239.0 lb

## 2021-03-12 DIAGNOSIS — Z3A38 38 weeks gestation of pregnancy: Secondary | ICD-10-CM

## 2021-03-12 DIAGNOSIS — Z3480 Encounter for supervision of other normal pregnancy, unspecified trimester: Secondary | ICD-10-CM

## 2021-03-12 LAB — POCT URINALYSIS DIPSTICK OB
Glucose, UA: NEGATIVE
POC,PROTEIN,UA: NEGATIVE

## 2021-03-12 NOTE — Progress Notes (Signed)
Routine Prenatal Care Visit  Subjective  Jody Mooney is a 26 y.o. G2P1001 at [redacted]w[redacted]d being seen today for ongoing prenatal care.  She is currently monitored for the following issues for this high-risk (BMI >35) pregnancy and has Encounter for general adult medical examination with abnormal findings; Vaginal discharge; Class 2 obesity due to excess calories without serious comorbidity with body mass index (BMI) of 35.0 to 35.9 in adult; Chest pain; Stressful job; Supervision of normal pregnancy; and Indication for care in labor or delivery on their problem list.  ----------------------------------------------------------------------------------- Patient reports irregular contractions, otherwise doing well. Desires to wait for spontaneous labor and consider IOL at 41 weeks.  Reviewed options for GBS prophylaxis, cephalosporin or nitrofurantoin or fosfomycin. Open to whichever ATB is recommended.   Contractions: Irregular. Vag. Bleeding: None.  Movement: Present. Leaking Fluid denies.  ----------------------------------------------------------------------------------- The following portions of the patient's history were reviewed and updated as appropriate: allergies, current medications, past family history, past medical history, past social history, past surgical history and problem list. Problem list updated.  Objective  Blood pressure 120/80, weight 239 lb (108.4 kg), last menstrual period 06/19/2020. Pregravid weight 218 lb (98.9 kg) Total Weight Gain 21 lb (9.526 kg) Urinalysis: Urine Protein    Urine Glucose    Fetal Status: Fetal Heart Rate (bpm): 140 Fundal Height: 40 cm Movement: Present     General:  Alert, oriented and cooperative. Patient is in no acute distress.  Skin: Skin is warm and dry. No rash noted.   Cardiovascular: Normal heart rate noted  Respiratory: Normal respiratory effort, no problems with respiration noted  Abdomen: Soft, gravid, appropriate for gestational age.  Pain/Pressure: Absent     Pelvic:  Cervical exam deferred        Extremities: Normal range of motion.  Edema: None  Mental Status: Normal mood and affect. Normal behavior. Normal judgment and thought content.   Assessment   26 y.o. G2P1001 at [redacted]w[redacted]d by  03/26/2021, by Last Menstrual Period presenting for routine prenatal visit  Plan   pregnancy 2 Problems (from 08/18/20 to present)    Problem Noted Resolved   Supervision of normal pregnancy 08/18/2020 by Homero Fellers, MD No   Overview Addendum 02/26/2021  2:40 PM by Allen Derry, CNM     Nursing Staff Provider  Office Location  [facility] Dating  LMP = 8wk [place]  Language  English Anatomy US  complete  Flu Vaccine  Declines Genetic Screen  NIPS: normal xx  TDaP vaccine   01/08/2021 Hgb A1C or  GTT Early : hgba1c 5.3 Third trimester : 132  Covid Unvaccinated Covid+  12/2019   LAB RESULTS   Rhogam  Not needed Blood Type O/Positive/-- (06/20 1008)   Feeding Plan  Breast Antibody Negative (06/20 1008)  Contraception  condoms Rubella 6.12 (06/20 1008)  Circumcision NA RPR Non Reactive (06/20 1008)   Pediatrician   HBsAg Negative (06/20 1008)   Support Person FOB: [name] HIV Non Reactive (06/20 1008)  Prenatal Classes Discussed Varicella Non-immune    GBS  (For PCN allergy, check sensitivities)   BTL Consent     VBAC Consent  Pap  2022 NIL    Hgb Electro   2017 neg    CF      SMA         Obesity in pregnancy: Pregravid BMI 37          Term labor symptoms and general obstetric precautions including but not limited to vaginal bleeding, contractions,  leaking of fluid and fetal movement were reviewed in detail with the patient. Please refer to After Visit Summary for other counseling recommendations.   Return in about 1 week (around 03/19/2021) for ROB.  Roberto Scales, CNM  [name], [facility]  03/12/21  2:23 PM

## 2021-03-13 ENCOUNTER — Encounter: Payer: Self-pay | Admitting: Licensed Practical Nurse

## 2021-03-14 ENCOUNTER — Encounter: Payer: Self-pay | Admitting: Obstetrics and Gynecology

## 2021-03-14 ENCOUNTER — Observation Stay
Admission: EM | Admit: 2021-03-14 | Discharge: 2021-03-14 | Disposition: A | Discharge status: Home / Self Care | Payer: Medicaid Other | Attending: Obstetrics and Gynecology | Admitting: Obstetrics and Gynecology

## 2021-03-14 ENCOUNTER — Observation Stay
Admission: EM | Admit: 2021-03-14 | Discharge: 2021-03-14 | Disposition: A | Discharge status: Home / Self Care | Payer: Medicaid Other | Source: Home / Self Care | Admitting: Obstetrics and Gynecology

## 2021-03-14 ENCOUNTER — Other Ambulatory Visit: Payer: Self-pay

## 2021-03-14 DIAGNOSIS — Z3A38 38 weeks gestation of pregnancy: Secondary | ICD-10-CM | POA: Insufficient documentation

## 2021-03-14 DIAGNOSIS — O479 False labor, unspecified: Secondary | ICD-10-CM | POA: Diagnosis present

## 2021-03-14 DIAGNOSIS — O26893 Other specified pregnancy related conditions, third trimester: Secondary | ICD-10-CM | POA: Insufficient documentation

## 2021-03-14 DIAGNOSIS — Z3480 Encounter for supervision of other normal pregnancy, unspecified trimester: Secondary | ICD-10-CM

## 2021-03-14 DIAGNOSIS — O4193X0 Disorder of amniotic fluid and membranes, unspecified, third trimester, not applicable or unspecified: Secondary | ICD-10-CM | POA: Diagnosis present

## 2021-03-14 LAB — RUPTURE OF MEMBRANE (ROM)PLUS: Rom Plus: NEGATIVE

## 2021-03-14 MED ORDER — ACETAMINOPHEN 325 MG PO TABS: 650.0000 mg | ORAL_TABLET | ORAL | Status: DC | PRN

## 2021-03-14 NOTE — OB Triage Note (Signed)
Jody Fick, MD at nurses station and SBAR given. Order given to review strip for 20 min NST and will evaluate for d/c to home and return with labor precautions. Will notify patient on plan of care.

## 2021-03-14 NOTE — Discharge Summary (Signed)
Physician Final Progress Note  Patient ID: Jody Mooney MRN: [id number] DOB/AGE: 1996-01-15 26 y.o.  Admit date: 03/14/2021 Admitting provider: [name], MD Discharge date: 03/14/2021   Admission Diagnoses: Contractoins  Discharge Diagnoses:  Principal Problem:   Braxton Hick's contraction   26 y.o. G2P1001 at [redacted]w[redacted]d by Estimated Date of Delivery: 03/26/21 second presentation today for contractions.  Patient had intercourse at home reported more cramping.  Cervix remains unchanged and irregular contraction pattern.  Patient desires unmedicated birth.  Nursing staff once again emphasized that we could not do anything to speed up labor prior to 39 weeks. +FM, no LOF, no VB    Consults: None  Significant Findings/ Diagnostic Studies: none  Procedures: NST 140, moderate, +accels, no decels Toco q5-69min  Discharge Condition: good  Disposition: Discharge disposition: 01-Home or Self Care       Diet: Regular diet  Discharge Activity: Activity as tolerated  Discharge Instructions     Discharge activity:  No Restrictions   Complete by: As directed    Discharge diet:  No restrictions   Complete by: As directed    Fetal Kick Count:  Lie on our left side for one hour after a meal, and count the number of times your baby kicks.  If it is less than 5 times, get up, move around and drink some juice.  Repeat the test 30 minutes later.  If it is still less than 5 kicks in an hour, notify your doctor.   Complete by: As directed    LABOR:  When conractions begin, you should start to time them from the beginning of one contraction to the beginning  of the next.  When contractions are 5 - 10 minutes apart or less and have been regular for at least an hour, you should call your health care provider.   Complete by: As directed    No sexual activity restrictions   Complete by: As directed    Notify physician for bleeding from the vagina   Complete by: As directed    Notify  physician for blurring of vision or spots before the eyes   Complete by: As directed    Notify physician for chills or fever   Complete by: As directed    Notify physician for fainting spells, "black outs" or loss of consciousness   Complete by: As directed    Notify physician for increase in vaginal discharge   Complete by: As directed    Notify physician for leaking of fluid   Complete by: As directed    Notify physician for pain or burning when urinating   Complete by: As directed    Notify physician for pelvic pressure (sudden increase)   Complete by: As directed    Notify physician for severe or continued nausea or vomiting   Complete by: As directed    Notify physician for sudden gushing of fluid from the vagina (with or without continued leaking)   Complete by: As directed    Notify physician for sudden, constant, or occasional abdominal pain   Complete by: As directed    Notify physician if baby moving less than usual   Complete by: As directed       Allergies as of 03/14/2021       Reactions   Vancomycin Itching, Other (See Comments)   Red man syndrome   Penicillins Rash   Has patient had a PCN reaction causing immediate rash, facial/tongue/throat swelling, SOB or lightheadedness with hypotension: Unknown Has patient  had a PCN reaction causing severe rash involving mucus membranes or skin necrosis: Unknown Has patient had a PCN reaction that required hospitalization: Unknown Has patient had a PCN reaction occurring within the last 10 years: no If all of the above answers are "NO", then may proceed with Cephalosporin use.        Medication List     TAKE these medications    Prenate Mini 18-0.6-0.4-350 MG Caps Take 1 each by mouth daily.         Total time spent taking care of this patient: triaged remotely  Signed: [name] 03/14/2021, 7:32 PM

## 2021-03-14 NOTE — Discharge Summary (Signed)
RN reviewed discharge instructions with patient. Gave patient opportunity for questions. All questions answered at this time. Pt verbalized understanding. Pt discharged home with significant other.

## 2021-03-14 NOTE — Discharge Summary (Signed)
Physician Final Progress Note  Patient ID: HARLEA GOETZINGER MRN: [id number] DOB/AGE: Jul 18, 1995 26 y.o.  Admit date: 03/14/2021 Admitting provider: [name], MD Discharge date: 03/14/2021   Admission Diagnoses: Leaking fluid  Discharge Diagnoses:  Principal Problem:   Labor and delivery indication for care or intervention   26 y.o. G2P1001 at [redacted]w[redacted]d presenting to L&D with contractions and leaking fluid.  No evidence of ROM and negative ROM plus.  Cervix 4cm on check and unchanged on recheck. +FM, no LOF, no VB.  Blood pressure 128/76, pulse (!) 101, temperature 98.3 F (36.8 C), temperature source Oral, resp. rate 18, height 5\' 3"  (1.6 m), weight 108 kg, last menstrual period 06/19/2020, SpO2 100 %.   Consults: None  Significant Findings/ Diagnostic Studies:  Results for orders placed or performed during the hospital encounter of 03/14/21 (from the past 24 hour(s))  ROM Plus ([place] only)     Status: None   Collection Time: 03/14/21 10:26 AM  Result Value Ref Range   Rom Plus NEGATIVE      Procedures: NST  135, moderate, +accels, no decels contractions irregular  Discharge Condition: good  Disposition: Discharge disposition: 01-Home or Self Care       Diet: Regular diet  Discharge Activity: Activity as tolerated  Discharge Instructions     Discharge activity:  No Restrictions   Complete by: As directed    Discharge diet:  No restrictions   Complete by: As directed    Fetal Kick Count:  Lie on our left side for one hour after a meal, and count the number of times your baby kicks.  If it is less than 5 times, get up, move around and drink some juice.  Repeat the test 30 minutes later.  If it is still less than 5 kicks in an hour, notify your doctor.   Complete by: As directed    LABOR:  When conractions begin, you should start to time them from the beginning of one contraction to the beginning  of the next.  When contractions are 5 - 10 minutes apart or less  and have been regular for at least an hour, you should call your health care provider.   Complete by: As directed    No sexual activity restrictions   Complete by: As directed    Notify physician for bleeding from the vagina   Complete by: As directed    Notify physician for blurring of vision or spots before the eyes   Complete by: As directed    Notify physician for chills or fever   Complete by: As directed    Notify physician for fainting spells, "black outs" or loss of consciousness   Complete by: As directed    Notify physician for increase in vaginal discharge   Complete by: As directed    Notify physician for leaking of fluid   Complete by: As directed    Notify physician for pain or burning when urinating   Complete by: As directed    Notify physician for pelvic pressure (sudden increase)   Complete by: As directed    Notify physician for severe or continued nausea or vomiting   Complete by: As directed    Notify physician for sudden gushing of fluid from the vagina (with or without continued leaking)   Complete by: As directed    Notify physician for sudden, constant, or occasional abdominal pain   Complete by: As directed    Notify physician if baby moving less than  usual   Complete by: As directed       Allergies as of 03/14/2021       Reactions   Vancomycin Itching, Other (See Comments)   Red man syndrome   Penicillins Rash   Has patient had a PCN reaction causing immediate rash, facial/tongue/throat swelling, SOB or lightheadedness with hypotension: Unknown Has patient had a PCN reaction causing severe rash involving mucus membranes or skin necrosis: Unknown Has patient had a PCN reaction that required hospitalization: Unknown Has patient had a PCN reaction occurring within the last 10 years: no If all of the above answers are "NO", then may proceed with Cephalosporin use.        Medication List     TAKE these medications    Prenate Mini 18-0.6-0.4-350  MG Caps Take 1 each by mouth daily.         Total time spent taking care of this patient: triaged remotely  Signed: [name] 03/14/2021, 12:37 PM

## 2021-03-14 NOTE — OB Triage Note (Signed)
Patient was discharged to home. Patient verbalized understanding of all d/c instructions including labor precautions in which to return. Patient left unit ambulatory to be driven home in private passenger vehicle.

## 2021-03-14 NOTE — OB Triage Note (Signed)
Pt presents c/o leaking of fluid since 7:30 this morning. Pt reports having occasional ctx since yesterday. Pt denies bleeding. Reports positive fetal movement. VSS. Will continue to monitor.

## 2021-03-14 NOTE — Progress Notes (Signed)
Fetal monitoring removed at this time for patient to walk and use the birthing ball.

## 2021-03-14 NOTE — OB Triage Note (Signed)
Pt arrived to unit wheeled by ED staff with complaints of contractions. Pt reports having intercourse since leaving OB triage earlier today. Patient reported no active vaginal bleeding or symptoms consistent with LOF. Patient  placed on EFM and TOCO to non tender area of abdomen. Patient advised of visitation policy due to covid 19. Patient history reviewed. Will notify provider of arrival.

## 2021-03-15 ENCOUNTER — Telehealth: Payer: Self-pay

## 2021-03-15 NOTE — Telephone Encounter (Signed)
Transition Care Management Unsuccessful Follow-up Telephone Call  Date of discharge and from where:  03/14/2021-[facility]  Attempts:  1st Attempt  Reason for unsuccessful TCM follow-up call:  Left voice message

## 2021-03-16 NOTE — Telephone Encounter (Signed)
Transition Care Management Unsuccessful Follow-up Telephone Call  Date of discharge and from where:  03/14/2021 from [facility]  Attempts:  2nd Attempt  Reason for unsuccessful TCM follow-up call:  Left voice message

## 2021-03-17 ENCOUNTER — Other Ambulatory Visit: Payer: Self-pay

## 2021-03-17 ENCOUNTER — Encounter: Payer: Self-pay | Admitting: Obstetrics and Gynecology

## 2021-03-17 ENCOUNTER — Observation Stay
Admission: EM | Admit: 2021-03-17 | Discharge: 2021-03-17 | Disposition: A | Discharge status: Home / Self Care | Payer: Medicaid Other | Attending: Licensed Practical Nurse | Admitting: Licensed Practical Nurse

## 2021-03-17 DIAGNOSIS — Z3A38 38 weeks gestation of pregnancy: Secondary | ICD-10-CM | POA: Insufficient documentation

## 2021-03-17 DIAGNOSIS — R102 Pelvic and perineal pain: Secondary | ICD-10-CM | POA: Insufficient documentation

## 2021-03-17 DIAGNOSIS — O26893 Other specified pregnancy related conditions, third trimester: Principal | ICD-10-CM | POA: Insufficient documentation

## 2021-03-17 DIAGNOSIS — Z3480 Encounter for supervision of other normal pregnancy, unspecified trimester: Secondary | ICD-10-CM

## 2021-03-17 DIAGNOSIS — O99513 Diseases of the respiratory system complicating pregnancy, third trimester: Secondary | ICD-10-CM | POA: Diagnosis not present

## 2021-03-17 DIAGNOSIS — J45909 Unspecified asthma, uncomplicated: Secondary | ICD-10-CM | POA: Insufficient documentation

## 2021-03-17 NOTE — Discharge Summary (Signed)
Physician Final Progress Note  Patient ID: Jody Mooney MRN: [id number] DOB/AGE: 1995/03/18 26 y.o.  Admit date: 03/17/2021 Admitting provider: Homero Fellers, MD Discharge date: 03/17/2021   Admission Diagnoses:  1) intrauterine pregnancy at [redacted]w[redacted]d  2) vaginal pressure   Discharge Diagnoses:  Principal Problem:   Indication for care in labor and delivery, antepartum    History of Present Illness: The patient is a 26 y.o. female G2P1001 at [redacted]w[redacted]d who presents for evaluation secondary to vaginal pressure. Casimer Bilis has bene feeling pressure in her pelvis all day, it is similar to the pressure she felt prior to giving birth to her first child. Denies contractions or LOF/VB. Endorses +FM. In her last labor she does not remember feeling contractions that were painful, she was given pitocin after arriving to the unit with [facility]. Given that her cervix is unchanged form her last visit, Dionisio Paschal is requesting to go home. Admits to worrying about knowing when she is in labor and if she will have a ride to the hospital as her partner and mother both work.   Past Medical History:  Diagnosis Date   Asthma     Past Surgical History:  Procedure Laterality Date   CYSTOSCOPY N/A 05/15/2019   Procedure: CYSTOSCOPY;  Surgeon: Homero Fellers, MD;  Location: [facility] ORS;  Service: Gynecology;  Laterality: N/A;   LAPAROSCOPY  05/15/2019   Procedure: LAPAROSCOPY DIAGNOSTIC WITH RIGHT OOPHORECTOMY AND LEFT OVARIAN CYSTECTOMY;  Surgeon: Homero Fellers, MD;  Location: [facility] ORS;  Service: Gynecology;;   WISDOM TOOTH EXTRACTION      No current facility-administered medications on file prior to encounter.   Current Outpatient Medications on File Prior to Encounter  Medication Sig Dispense Refill   Prenat-FeCbn-FeAsp-Meth-FA-DHA (PRENATE MINI) 18-0.6-0.4-350 MG CAPS Take 1 each by mouth daily. (Patient not taking: Reported on 03/14/2021) 30 capsule 11    Allergies  Allergen Reactions    Vancomycin Itching and Other (See Comments)    Red man syndrome   Penicillins Rash    Has patient had a PCN reaction causing immediate rash, facial/tongue/throat swelling, SOB or lightheadedness with hypotension: Unknown Has patient had a PCN reaction causing severe rash involving mucus membranes or skin necrosis: Unknown Has patient had a PCN reaction that required hospitalization: Unknown Has patient had a PCN reaction occurring within the last 10 years: no If all of the above answers are "NO", then may proceed with Cephalosporin use.     Social History   Socioeconomic History   Marital status: Significant Other    Spouse name: [name]   Number of children: Not on file   Years of education: Not on file   Highest education level: Not on file  Occupational History   Occupation: student  Tobacco Use   Smoking status: Never   Smokeless tobacco: Never  Vaping Use   Vaping Use: Never used  Substance and Sexual Activity   Alcohol use: No   Drug use: Never   Sexual activity: Yes    Birth control/protection: None  Other Topics Concern   Not on file  Social History Narrative   Currently attending college.    Feels safe in relationship of 2 years.   Social Determinants of Health   Financial Resource Strain: Not on file  Food Insecurity: Not on file  Transportation Needs: Not on file  Physical Activity: Not on file  Stress: Not on file  Social Connections: Not on file  Intimate Partner Violence: Not on file    Family  History  Problem Relation Age of Onset   Hyperlipidemia Mother    Anxiety disorder Other        Grandmother and mother   Depression Other        Grandmother and mother   Hyperlipidemia Other    Diabetes type II Other        Grand parents   Other Other        VSD repaired in grandmother age 59     ROS vaginal pressure   Physical Exam: BP 136/73 (BP Location: Right Arm)    Pulse 88    Temp 98 F (36.7 C) (Oral)    Resp 14    Ht 5\' 3"  (1.6 m)    Wt 108  kg    LMP 06/19/2020    BMI 42.16 kg/m   Physical Exam Pulmonary:     Effort: Pulmonary effort is normal.  Abdominal:     Comments: Gravid   Neurological:     Mental Status: She is alert.   SVE 4/70/-3 per RN   EFM: Baseline 135 moderate variability, pos accel, neg decel.  TOCO: irregular mild contractions  Consults: None  Significant Findings/ Diagnostic Studies: NA  Procedures: NST Reactive    Hospital Course: The patient was admitted to Labor and Delivery Triage for observation. Not in labor  Discharge Condition: stable  Disposition: Discharge disposition: 01-Home or Self Care     Reviewed signs of labor, Encouraged to develop plan if she needs transportation to the hospital  Discussed possible membrane sweep at next ROB on Thursday    Diet: Regular diet  Discharge Activity: Activity as tolerated   Allergies as of 03/17/2021       Reactions   Vancomycin Itching, Other (See Comments)   Red man syndrome   Penicillins Rash   Has patient had a PCN reaction causing immediate rash, facial/tongue/throat swelling, SOB or lightheadedness with hypotension: Unknown Has patient had a PCN reaction causing severe rash involving mucus membranes or skin necrosis: Unknown Has patient had a PCN reaction that required hospitalization: Unknown Has patient had a PCN reaction occurring within the last 10 years: no If all of the above answers are "NO", then may proceed with Cephalosporin use.        Medication List     TAKE these medications    Prenate Mini 18-0.6-0.4-350 MG Caps Take 1 each by mouth daily.          SignedJillene Bucks Roarke Marciano, CNM  03/17/2021, 10:58 PM

## 2021-03-17 NOTE — OB Triage Note (Signed)

## 2021-03-17 NOTE — OB Triage Note (Signed)
Pt Jody Mooney 25 y.o. presents to labor and delivery triage reporting vaginal pressure. Pt is a G2P1001 at [redacted]w[redacted]d . Pt denies signs and symptoms consistent with rupture of membranes or active vaginal bleeding. Pt denies consistent contractions and states positive fetal movement. External FM and TOCO applied to non-tender abdomen and assessing. Initial FHR 135. Vital signs obtained and within normal limits. Provider notified of pt.

## 2021-03-18 ENCOUNTER — Telehealth: Payer: Self-pay

## 2021-03-18 NOTE — Telephone Encounter (Signed)
Transition Care Management Unsuccessful Follow-up Telephone Call  Date of discharge and from where:  03/17/2021 from [facility]  Attempts:  1st Attempt  Reason for unsuccessful TCM follow-up call:  Left voice message

## 2021-03-18 NOTE — Telephone Encounter (Signed)
Transition Care Management Unsuccessful Follow-up Telephone Call  Date of discharge and from where:  03/14/2021 from [facility]  Attempts:  3rd Attempt  Reason for unsuccessful TCM follow-up call:  Unable to reach patient

## 2021-03-19 ENCOUNTER — Encounter: Payer: Self-pay | Admitting: Licensed Practical Nurse

## 2021-03-19 ENCOUNTER — Ambulatory Visit (INDEPENDENT_AMBULATORY_CARE_PROVIDER_SITE_OTHER): Payer: Medicaid Other | Admitting: Licensed Practical Nurse

## 2021-03-19 ENCOUNTER — Other Ambulatory Visit: Payer: Self-pay

## 2021-03-19 VITALS — BP 122/74 | Wt 239.0 lb

## 2021-03-19 DIAGNOSIS — Z3A39 39 weeks gestation of pregnancy: Secondary | ICD-10-CM

## 2021-03-19 DIAGNOSIS — Z3480 Encounter for supervision of other normal pregnancy, unspecified trimester: Secondary | ICD-10-CM

## 2021-03-19 NOTE — Telephone Encounter (Signed)
Transition Care Management Unsuccessful Follow-up Telephone Call  Date of discharge and from where:  03/17/2021 from [facility]  Attempts:  2nd Attempt  Reason for unsuccessful TCM follow-up call:  Left voice message

## 2021-03-19 NOTE — Progress Notes (Signed)
Routine Prenatal Care Visit  Subjective  Jody Mooney is a 26 y.o. G2P1001 at [redacted]w[redacted]d being seen today for ongoing prenatal care.  She is currently monitored for the following issues for this low-risk pregnancy and has Encounter for general adult medical examination with abnormal findings; Vaginal discharge; Class 2 obesity due to excess calories without serious comorbidity with body mass index (BMI) of 35.0 to 35.9 in adult; Chest pain; Stressful job; Supervision of normal pregnancy; Indication for care in labor or delivery; Labor and delivery indication for care or intervention; Braxton Hick's contraction; and Indication for care in labor and delivery, antepartum on their problem list.  ----------------------------------------------------------------------------------- Patient reports  pelvic pressure, irregular contractions .  Doing well. Active fetus. Tried nipple stim, which gave her contractions. Desires to wait for spontaneous labor, will discuss scheduling IOL at next visit.  Ws seen in Triage a few days ago, no change in exam, membranes swept today.  Contractions: Irregular. Vag. Bleeding: None.   . Leaking Fluid denies.  ----------------------------------------------------------------------------------- The following portions of the patient's history were reviewed and updated as appropriate: allergies, current medications, past family history, past medical history, past social history, past surgical history and problem list. Problem list updated.  Objective  Blood pressure 122/74, weight 239 lb (108.4 kg), last menstrual period 06/19/2020. Pregravid weight 218 lb (98.9 kg) Total Weight Gain 21 lb (9.526 kg) Urinalysis: Urine Protein    Urine Glucose    Fetal Status:           General:  Alert, oriented and cooperative. Patient is in no acute distress.  Skin: Skin is warm and dry. No rash noted.   Cardiovascular: Normal heart rate noted  Respiratory: Normal respiratory effort, no  problems with respiration noted  Abdomen: Soft, gravid, appropriate for gestational age. Pain/Pressure: Absent     Pelvic:  Cervical exam performed      4/70/-3  Extremities: Normal range of motion.     Mental Status: Normal mood and affect. Normal behavior. Normal judgment and thought content.   Assessment   26 y.o. G2P1001 at [redacted]w[redacted]d by  03/26/2021, by Last Menstrual Period presenting for routine prenatal visit  Plan   pregnancy 2 Problems (from 08/18/20 to present)     Problem Noted Resolved   Supervision of normal pregnancy 08/18/2020 by Homero Fellers, MD No   Overview Addendum 03/12/2021  2:52 PM by Allen Derry, [place] Staff Provider  Office Location  [facility] Dating  LMP = 8wk [place]  Language  English Anatomy US  complete  Flu Vaccine  Declines Genetic Screen  NIPS: normal xx  TDaP vaccine   01/08/2021 Hgb A1C or  GTT Early : hgba1c 5.3 Third trimester : 132  Covid Unvaccinated Covid+  12/2019   LAB RESULTS   Rhogam  Not needed Blood Type O/Positive/-- (06/20 1008)   Feeding Plan  Breast Antibody Negative (06/20 1008)  Contraception  condoms Rubella 6.12 (06/20 1008)  Circumcision NA RPR Non Reactive (06/20 1008)   Pediatrician   HBsAg Negative (06/20 1008)   Support Person FOB: [name] HIV Non Reactive (06/20 1008)  Prenatal Classes Discussed Varicella Non-immune    GBS Positive-Clinda resistant   BTL Consent     VBAC Consent  Pap  2022 NIL    Hgb Electro   2017 neg    CF      SMA         Obesity in pregnancy: Pregravid BMI 37  Term labor symptoms and general obstetric precautions including but not limited to vaginal bleeding, contractions, leaking of fluid and fetal movement were reviewed in detail with the patient. Please refer to After Visit Summary for other counseling recommendations.   Return in about 1 week (around 03/26/2021) for ROB.NST.  Roberto Scales, CNM  [name], [facility]  03/19/21  10:46 AM

## 2021-03-19 NOTE — Progress Notes (Signed)
No vb. No lof. Cervical check today

## 2021-03-20 ENCOUNTER — Encounter: Payer: Self-pay | Admitting: Obstetrics and Gynecology

## 2021-03-20 ENCOUNTER — Other Ambulatory Visit: Payer: Self-pay

## 2021-03-20 ENCOUNTER — Inpatient Hospital Stay
Admission: EM | Admit: 2021-03-20 | Discharge: 2021-03-21 | DRG: 806 | Disposition: A | Discharge status: Home / Self Care | Payer: Medicaid Other | Attending: Obstetrics and Gynecology | Admitting: Obstetrics and Gynecology

## 2021-03-20 DIAGNOSIS — Z3A39 39 weeks gestation of pregnancy: Secondary | ICD-10-CM | POA: Diagnosis not present

## 2021-03-20 DIAGNOSIS — Z880 Allergy status to penicillin: Secondary | ICD-10-CM | POA: Diagnosis not present

## 2021-03-20 DIAGNOSIS — O99824 Streptococcus B carrier state complicating childbirth: Secondary | ICD-10-CM | POA: Diagnosis not present

## 2021-03-20 DIAGNOSIS — O9081 Anemia of the puerperium: Secondary | ICD-10-CM | POA: Diagnosis not present

## 2021-03-20 DIAGNOSIS — O9882 Other maternal infectious and parasitic diseases complicating childbirth: Secondary | ICD-10-CM | POA: Diagnosis not present

## 2021-03-20 DIAGNOSIS — D62 Acute posthemorrhagic anemia: Secondary | ICD-10-CM | POA: Diagnosis not present

## 2021-03-20 DIAGNOSIS — Z3480 Encounter for supervision of other normal pregnancy, unspecified trimester: Secondary | ICD-10-CM

## 2021-03-20 DIAGNOSIS — O9903 Anemia complicating the puerperium: Secondary | ICD-10-CM

## 2021-03-20 DIAGNOSIS — Z20822 Contact with and (suspected) exposure to covid-19: Secondary | ICD-10-CM | POA: Diagnosis present

## 2021-03-20 DIAGNOSIS — B951 Streptococcus, group B, as the cause of diseases classified elsewhere: Secondary | ICD-10-CM | POA: Diagnosis not present

## 2021-03-20 DIAGNOSIS — O26893 Other specified pregnancy related conditions, third trimester: Secondary | ICD-10-CM | POA: Diagnosis not present

## 2021-03-20 LAB — RESP PANEL BY RT-PCR (FLU A&B, COVID) ARPGX2
Influenza A by PCR: NEGATIVE
Influenza B by PCR: NEGATIVE
SARS Coronavirus 2 by RT PCR: NEGATIVE

## 2021-03-20 LAB — CBC
HCT: 39.4 % (ref 36.0–46.0)
Hemoglobin: 13 g/dL (ref 12.0–15.0)
MCH: 28.1 pg (ref 26.0–34.0)
MCHC: 33 g/dL (ref 30.0–36.0)
MCV: 85.1 fL (ref 80.0–100.0)
Platelets: 210 10*3/uL (ref 150–400)
RBC: 4.63 MIL/uL (ref 3.87–5.11)
RDW: 13.7 % (ref 11.5–15.5)
WBC: 10.6 10*3/uL — ABNORMAL HIGH (ref 4.0–10.5)
nRBC: 0 % (ref 0.0–0.2)

## 2021-03-20 LAB — TYPE AND SCREEN
ABO/RH(D): O POS
Antibody Screen: NEGATIVE

## 2021-03-20 MED ORDER — ACETAMINOPHEN 325 MG PO TABS: 650.0000 mg | ORAL_TABLET | ORAL | Status: DC | PRN

## 2021-03-20 MED ORDER — ONDANSETRON HCL 4 MG/2ML IJ SOLN: 4.0000 mg | Freq: Four times a day (QID) | INTRAMUSCULAR | Status: DC | PRN

## 2021-03-20 MED ORDER — ONDANSETRON HCL 4 MG PO TABS: 4.0000 mg | ORAL_TABLET | ORAL | Status: DC | PRN

## 2021-03-20 MED ORDER — OXYTOCIN 10 UNIT/ML IJ SOLN
INTRAMUSCULAR | Status: AC
  Filled 2021-03-20: qty 2

## 2021-03-20 MED ORDER — PRENATAL MULTIVITAMIN CH: 1.0000 | ORAL_TABLET | Freq: Every day | ORAL | Status: DC

## 2021-03-20 MED ORDER — OXYCODONE-ACETAMINOPHEN 5-325 MG PO TABS: 1.0000 | ORAL_TABLET | ORAL | Status: DC | PRN

## 2021-03-20 MED ORDER — SOD CITRATE-CITRIC ACID 500-334 MG/5ML PO SOLN: 30.0000 mL | ORAL | Status: DC | PRN

## 2021-03-20 MED ORDER — VARICELLA VIRUS VACCINE LIVE 1350 PFU/0.5ML IJ SUSR
0.5000 mL | INTRAMUSCULAR | Status: DC | PRN
  Filled 2021-03-20 (×2): qty 0.5

## 2021-03-20 MED ORDER — BUTORPHANOL TARTRATE 1 MG/ML IJ SOLN
1.0000 mg | INTRAMUSCULAR | Status: DC | PRN
  Administered 2021-03-20: 1 mg via INTRAVENOUS
  Filled 2021-03-20: qty 1

## 2021-03-20 MED ORDER — LACTATED RINGERS IV SOLN: INTRAVENOUS | Status: DC

## 2021-03-20 MED ORDER — DIPHENHYDRAMINE HCL 25 MG PO CAPS: 25.0000 mg | ORAL_CAPSULE | Freq: Four times a day (QID) | ORAL | Status: DC | PRN

## 2021-03-20 MED ORDER — LACTATED RINGERS IV SOLN: 500.0000 mL | INTRAVENOUS | Status: DC | PRN

## 2021-03-20 MED ORDER — OXYTOCIN-SODIUM CHLORIDE 30-0.9 UT/500ML-% IV SOLN
2.5000 [IU]/h | INTRAVENOUS | Status: DC
  Filled 2021-03-20: qty 500

## 2021-03-20 MED ORDER — DIBUCAINE (PERIANAL) 1 % EX OINT: 1.0000 "application " | TOPICAL_OINTMENT | CUTANEOUS | Status: DC | PRN

## 2021-03-20 MED ORDER — WITCH HAZEL-GLYCERIN EX PADS
1.0000 "application " | MEDICATED_PAD | CUTANEOUS | Status: DC | PRN
  Filled 2021-03-20: qty 100

## 2021-03-20 MED ORDER — COCONUT OIL OIL
1.0000 "application " | TOPICAL_OIL | Status: DC | PRN
  Filled 2021-03-20 (×2): qty 120

## 2021-03-20 MED ORDER — MISOPROSTOL 200 MCG PO TABS
ORAL_TABLET | ORAL | Status: AC
  Filled 2021-03-20: qty 4

## 2021-03-20 MED ORDER — SIMETHICONE 80 MG PO CHEW: 80.0000 mg | CHEWABLE_TABLET | ORAL | Status: DC | PRN

## 2021-03-20 MED ORDER — IBUPROFEN 600 MG PO TABS
600.0000 mg | ORAL_TABLET | Freq: Four times a day (QID) | ORAL | Status: DC
  Administered 2021-03-21 (×3): 600 mg via ORAL
  Filled 2021-03-20 (×3): qty 1

## 2021-03-20 MED ORDER — OXYTOCIN BOLUS FROM INFUSION
333.0000 mL | Freq: Once | INTRAVENOUS | Status: AC
  Administered 2021-03-20: 333 mL via INTRAVENOUS

## 2021-03-20 MED ORDER — LIDOCAINE HCL (PF) 1 % IJ SOLN: 30.0000 mL | INTRAMUSCULAR | Status: DC | PRN

## 2021-03-20 MED ORDER — CEFAZOLIN SODIUM-DEXTROSE 2-4 GM/100ML-% IV SOLN
2.0000 g | Freq: Once | INTRAVENOUS | Status: AC
  Administered 2021-03-20: 2 g via INTRAVENOUS
  Filled 2021-03-20: qty 100

## 2021-03-20 MED ORDER — AMMONIA AROMATIC IN INHA
RESPIRATORY_TRACT | Status: AC
  Filled 2021-03-20: qty 10

## 2021-03-20 MED ORDER — BENZOCAINE-MENTHOL 20-0.5 % EX AERO
1.0000 "application " | INHALATION_SPRAY | CUTANEOUS | Status: DC | PRN
  Filled 2021-03-20: qty 56

## 2021-03-20 MED ORDER — ONDANSETRON HCL 4 MG/2ML IJ SOLN: 4.0000 mg | INTRAMUSCULAR | Status: DC | PRN

## 2021-03-20 MED ORDER — OXYCODONE-ACETAMINOPHEN 5-325 MG PO TABS: 2.0000 | ORAL_TABLET | ORAL | Status: DC | PRN

## 2021-03-20 MED ORDER — SENNOSIDES-DOCUSATE SODIUM 8.6-50 MG PO TABS: 2.0000 | ORAL_TABLET | ORAL | Status: DC

## 2021-03-20 MED ORDER — CEFAZOLIN SODIUM-DEXTROSE 1-4 GM/50ML-% IV SOLN: 1.0000 g | Freq: Three times a day (TID) | INTRAVENOUS | Status: DC

## 2021-03-20 NOTE — Lactation Note (Signed)
This note was copied from a baby's chart. Lactation Consultation Note  Patient Name: Jody Mooney [id number] Date: 03/20/2021 Reason for consult: Initial assessment;Term Age:25 hours  Maternal Data Does the patient have breastfeeding experience prior to this delivery?: Yes How long did the patient breastfeed?: 2 mths Stopped because she felt like she didn't have enough milk Feeding Mother's Current Feeding Choice: Breast Milk Baby was fussy and mom attempted for short time at left breast in cradle hold but baby fell back asleep, mom encouraged that this was normal behavior for first 24 hrs, baby has not latched to this side, I encouraged her to call for assist if she attempts again, may need to try another position  LATCH Score Latch:  (I did not observe a feeding)  Audible Swallowing: A few with stimulation  Type of Nipple: Everted at rest and after stimulation  Comfort (Breast/Nipple): Soft / non-tender  Hold (Positioning): No assistance needed to correctly position infant at breast.  LATCH Score: 9   Lactation Tools Discussed/Used  LC name and no written on white board  Interventions Interventions: Breast feeding basics reviewed;Education  Discharge Pump: Personal WIC Program: Yes  Consult Status Consult Status: PRN    Ferol Luz 03/20/2021, 3:54 PM

## 2021-03-20 NOTE — H&P (Signed)
Obstetric H&P   Chief Complaint: Contractions  Prenatal Care Provider: [facility]  History of Present Illness: 26 y.o. G2P1001 [redacted]w[redacted]d by 03/26/2021, by Last Menstrual Period presenting to L&D with contractions.  This is her third presentation in the past week.  Cervix slightly more effaced but still 4cm.  The patient is GBS positive.  The patient reports contractions are more painful, some spotting (membrane sweep yesterday in clinic), +FM.    Patient's pregnancy has otherwise been uncomplicated.   Pregravid weight 98.9 kg Total Weight Gain 9.979 kg  pregnancy 2 Problems (from 08/18/20 to present)     Problem Noted Resolved   Supervision of normal pregnancy 08/18/2020 by Homero Fellers, MD No   Overview Addendum 03/12/2021  2:52 PM by Allen Derry, [place] Staff Provider  Office Location  [facility] Dating  LMP = 8wk [place]  Language  English Anatomy US  complete  Flu Vaccine  Declines Genetic Screen  NIPS: normal xx  TDaP vaccine   01/08/2021 Hgb A1C or  GTT Early : hgba1c 5.3 Third trimester : 132  Covid Unvaccinated Covid+  12/2019   LAB RESULTS   Rhogam  Not needed Blood Type O/Positive/-- (06/20 1008)   Feeding Plan  Breast Antibody Negative (06/20 1008)  Contraception  condoms Rubella 6.12 (06/20 1008)  Circumcision NA RPR Non Reactive (06/20 1008)   Pediatrician   HBsAg Negative (06/20 1008)   Support Person FOB: [name] HIV Non Reactive (06/20 1008)  Prenatal Classes Discussed Varicella Non-immune    GBS Positive-Clinda resistant   BTL Consent     VBAC Consent  Pap  2022 NIL    Hgb Electro   2017 neg    CF      SMA        Obesity in pregnancy: Pregravid BMI 37           Review of Systems: 10 point review of systems negative unless otherwise noted in HPI  Past Medical History: Patient Active Problem List   Diagnosis Date Noted   Normal labor 03/20/2021   Indication for care in labor and delivery, antepartum 03/17/2021   Labor and delivery  indication for care or intervention 03/14/2021   Braxton Hick's contraction 03/14/2021   Indication for care in labor or delivery 01/25/2021   Supervision of normal pregnancy 08/18/2020     Nursing Staff Provider  Office Location  [facility] Dating  LMP = 8wk [place]  Language  English Anatomy US  complete  Flu Vaccine  Declines Genetic Screen  NIPS: normal xx  TDaP vaccine   01/08/2021 Hgb A1C or  GTT Early : hgba1c 5.3 Third trimester : 132  Covid Unvaccinated Covid+  12/2019   LAB RESULTS   Rhogam  Not needed Blood Type O/Positive/-- (06/20 1008)   Feeding Plan  Breast Antibody Negative (06/20 1008)  Contraception  condoms Rubella 6.12 (06/20 1008)  Circumcision NA RPR Non Reactive (06/20 1008)   Pediatrician   HBsAg Negative (06/20 1008)   Support Person FOB: [name] HIV Non Reactive (06/20 1008)  Prenatal Classes Discussed Varicella Non-immune    GBS Positive-Clinda resistant   BTL Consent     VBAC Consent  Pap  2022 NIL    Hgb Electro   2017 neg    CF      SMA        Obesity in pregnancy: Pregravid BMI 37    Class 2 obesity due to excess calories without serious comorbidity with  body mass index (BMI) of 35.0 to 35.9 in adult 04/17/2019   Chest pain 04/17/2019   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   Vaginal discharge 11/08/2017   Encounter for general adult medical examination with abnormal findings 11/03/2011    IUD insertion mirena 06/07/2018     Past Surgical History: Past Surgical History:  Procedure Laterality Date   CYSTOSCOPY N/A 05/15/2019   Procedure: CYSTOSCOPY;  Surgeon: Homero Fellers, MD;  Location: [facility] ORS;  Service: Gynecology;  Laterality: N/A;   LAPAROSCOPY  05/15/2019   Procedure: LAPAROSCOPY DIAGNOSTIC WITH RIGHT OOPHORECTOMY AND LEFT OVARIAN CYSTECTOMY;  Surgeon: Homero Fellers, MD;  Location: [facility] ORS;  Service: Gynecology;;   WISDOM TOOTH EXTRACTION      Past Obstetric History: # 1 - Date: 03/04/18, Sex: Female, Weight: 2994 g, GA: [redacted]w[redacted]d, Delivery:  Vaginal, Spontaneous, Apgar1: 8, Apgar5: 9, Living: Living, Birth Comments: None  # 2 - Date: None, Sex: None, Weight: None, GA: None, Delivery: None, Apgar1: None, Apgar5: None, Living: None, Birth Comments: None   Past Gynecologic History:  Family History: Family History  Problem Relation Age of Onset   Hyperlipidemia Mother    Anxiety disorder Other        Grandmother and mother   Depression Other        Grandmother and mother   Hyperlipidemia Other    Diabetes type II Other        Grand parents   Other Other        VSD repaired in grandmother age 14    Social History: Social History   Socioeconomic History   Marital status: Significant Other    Spouse name: [name]   Number of children: Not on file   Years of education: Not on file   Highest education level: Not on file  Occupational History   Occupation: student  Tobacco Use   Smoking status: Never   Smokeless tobacco: Never  Vaping Use   Vaping Use: Never used  Substance and Sexual Activity   Alcohol use: No   Drug use: Never   Sexual activity: Yes    Birth control/protection: None  Other Topics Concern   Not on file  Social History Narrative   Currently attending college.    Feels safe in relationship of 2 years.   Social Determinants of Health   Financial Resource Strain: Not on file  Food Insecurity: Not on file  Transportation Needs: Not on file  Physical Activity: Not on file  Stress: Not on file  Social Connections: Not on file  Intimate Partner Violence: Not on file    Medications: Prior to Admission medications   Medication Sig Start Date End Date Taking? Authorizing Provider  Prenat-FeCbn-FeAsp-Meth-FA-DHA (PRENATE MINI) 18-0.6-0.4-350 MG CAPS Take 1 each by mouth daily. 08/27/20  Yes Schuman, Stefanie Libel, MD    Allergies: Allergies  Allergen Reactions   Vancomycin Itching and Other (See Comments)    Red man syndrome   Penicillins Rash    Has patient had a PCN reaction causing  immediate rash, facial/tongue/throat swelling, SOB or lightheadedness with hypotension: Unknown Has patient had a PCN reaction causing severe rash involving mucus membranes or skin necrosis: Unknown Has patient had a PCN reaction that required hospitalization: Unknown Has patient had a PCN reaction occurring within the last 10 years: no If all of the above answers are "NO", then may proceed with Cephalosporin use.     Physical Exam: Vitals: Blood pressure 136/74, pulse 79, temperature 98.2 F (36.8 C), temperature source  Oral, resp. rate 16, height 5\' 3"  (1.6 m), weight 108.9 kg, last menstrual period 06/19/2020.  Urine Dip Protein: N/A  FHT: 130, moderate, +accels, no decels Toco: q65min  General: NAD HEENT: normocephalic, anicteric Pulmonary: No increased work of breathing Cardiovascular: RRR, distal pulses 2+ Abdomen: Gravid, non-tender Leopolds:vtx Genitourinary:  Dilation: 4 Effacement (%): 90 Cervical Position: Middle Station: -1 Presentation: Vertex Exam by:: K.Maldonado,RN  Extremities: no edema, erythema, or tenderness Neurologic: Grossly intact Psychiatric: mood appropriate, affect full  Labs: No results found for this or any previous visit (from the past 24 hour(s)).  Assessment: 26 y.o. G2P1001 [redacted]w[redacted]d by 03/26/2021, by Last Menstrual Period term labor will augment if needed  Plan: 1) Admit for term labor - AROM 4-hr after first dose of ancef if no SROM  2) Fetus - cat I tracing - GBS ppx cephalosporin given PCN rash   3) PNL - Blood type O/Positive/-- (06/20 1008) / Anti-bodyscreen Negative (06/20 1008) / Rubella 6.12 (06/20 1008) / Varicella Non-Immune / RPR Non Reactive (11/10 0955) / HBsAg Negative (06/20 1008) / HIV Non Reactive (11/10 0955) / 1-hr OGTT 133 / GBS Positive/-- (12/29 1430)  4) Immunization History -  Immunization History  Administered Date(s) Administered   Influenza,inj,Quad PF,6+ Mos 01/12/2013, 01/01/2014, 11/28/2014, 03/08/2016,  11/03/2017   Meningococcal Conjugate 05/31/2012   Tdap 07/18/2015, 01/05/2018, 01/14/2021    5) Disposition - pending deliveyr  [name], MD, [place], [facility] 03/20/2021, 9:07 AM

## 2021-03-20 NOTE — Discharge Summary (Signed)
OB Discharge Summary     Patient Name: Jody Mooney DOB: [birth year] MRN: [id number]  Date of admission: 03/20/2021 Delivering MD: [name]   Date of discharge: 03/21/2021  Admitting diagnosis: Normal labor [O80, Z37.9] Intrauterine pregnancy: [redacted]w[redacted]d     Secondary diagnosis:  Principal Problem:   Normal labor Active Problems:   SVD (spontaneous vaginal delivery)  Additional problems: None     Discharge diagnosis: Term Pregnancy Delivered                                                                                                Post partum procedures: none  Augmentation: N/A  Complications: None  Hospital course:  Onset of Labor With Vaginal Delivery      26 y.o. yo G2P1001 at [redacted]w[redacted]d was admitted in Active Labor on 03/20/2021. Patient had an uncomplicated labor course as follows:  Membrane Rupture Time/Date: 11:35 AM ,03/20/2021   Delivery Method:Vaginal, Spontaneous  Episiotomy: None  Lacerations:  None  Patient had an uncomplicated postpartum course.  She is ambulating, tolerating a regular diet, passing flatus, and urinating well. Patient is discharged home in stable condition on 03/21/21.  Newborn Data: Birth date:03/20/2021  Birth time:12:08 PM  Gender:Female  Living status:Living  Apgars:7 ,9  Weight:3280 g   Physical exam  Vitals:   03/20/21 2050 03/20/21 2343 03/21/21 0408 03/21/21 1024  BP: 117/79 126/81 117/62 113/82  Pulse: 86 82 94 92  Resp: 20 20 20 18   Temp: 97.9 F (36.6 C)  98.9 F (37.2 C) 98.7 F (37.1 C)  TempSrc: Oral   Oral  SpO2: 99% 99% 95% 97%  Weight:      Height:       General: alert, cooperative, and no distress Lochia: appropriate Uterine Fundus: firm Incision: N/A DVT Evaluation: No evidence of DVT seen on physical exam. Labs: Lab Results  Component Value Date   WBC 12.4 (H) 03/21/2021   HGB 12.1 03/21/2021   HCT 37.4 03/21/2021   MCV 85.4 03/21/2021   PLT 220 03/21/2021   CMP Latest Ref Rng & Units  08/18/2020  Glucose 65 - 99 mg/dL 68  BUN 6 - 20 mg/dL 7  Creatinine 0.57 - 1.00 mg/dL 0.74  Sodium 134 - 144 mmol/L 137  Potassium 3.5 - 5.2 mmol/L 4.0  Chloride 96 - 106 mmol/L 101  CO2 20 - 29 mmol/L 24  Calcium 8.7 - 10.2 mg/dL 9.1  Total Protein 6.0 - 8.5 g/dL 6.7  Total Bilirubin 0.0 - 1.2 mg/dL 0.3  Alkaline Phos 44 - 121 IU/L 85  AST 0 - 40 IU/L 13  ALT 0 - 32 IU/L 10    Discharge instruction: per After Visit Summary and "Baby and Me Booklet".  After visit meds:  Allergies as of 03/21/2021       Reactions   Vancomycin Itching, Other (See Comments)   Red man syndrome   Penicillins Rash   Has patient had a PCN reaction causing immediate rash, facial/tongue/throat swelling, SOB or lightheadedness with hypotension: Unknown Has patient had a PCN reaction causing severe rash involving mucus membranes or skin  necrosis: Unknown Has patient had a PCN reaction that required hospitalization: Unknown Has patient had a PCN reaction occurring within the last 10 years: no If all of the above answers are "NO", then may proceed with Cephalosporin use.        Medication List     TAKE these medications    ibuprofen 600 MG tablet Commonly known as: ADVIL Take 1 tablet (600 mg total) by mouth every 6 (six) hours as needed.   Prenate Mini 18-0.6-0.4-350 MG Caps Take 1 each by mouth daily.               Discharge Care Instructions  (From admission, onward)           Start     Ordered   03/21/21 0000  Discharge wound care:       Comments: SHOWER DAILY Wash incision gently with soap and water.  Call office with any drainage, redness, or firmness of the incision.   03/21/21 1136            Diet: routine diet  Activity: Advance as tolerated. Pelvic rest for 6 weeks.   Outpatient follow up:6 weeks Follow up Appt: Future Appointments  Date Time Provider [place]  03/26/2021 10:15 AM Gae Dry, MD [facility] None   Follow up Visit:No follow-ups on  file.  Postpartum contraception: Condoms  Newborn Data: Live born female  Birth Weight:   APGAR: ,   Newborn Delivery   Birth date/time: 03/20/2021 12:08:00 Delivery type: Vaginal, Spontaneous      Baby Feeding: Breast Disposition:home with mother   03/21/2021 Homero Fellers, MD

## 2021-03-20 NOTE — Progress Notes (Signed)
Jody Mooney is stable after delivery.Pt's support person is at bedside. Pt bonding with infant and performing skin to skin after delivery.Pt is stable and ambulated to the bathroom, voided a sufficient amount, and tolerated activity well. Pt ambulated to the wheelchair and transferred to mother/baby unit RM [id number] for couplet care. Report given to Jiles Prows

## 2021-03-20 NOTE — OB Triage Note (Signed)
Pt Jody Mooney 25 y.o. presents to labor and delivery triage reporting ctx  . Pt is a G2P1001 at [redacted]w[redacted]d . Pt denies signs and symptons consistent with rupture of membranes or active vaginal bleeding. Pt reports contractions that started this morning rating pain 10/10 in lower abdomen and back. Pt also states positive fetal movement. External FM and TOCO applied to non-tender abdomen and assessing. Initial FHR 145. Vital signs obtained and within normal limits. Provider notified of pt.

## 2021-03-20 NOTE — Telephone Encounter (Signed)
Transition Care Management Unsuccessful Follow-up Telephone Call  Date of discharge and from where:  03/17/2021 from [facility]  Attempts:  3rd Attempt  Reason for unsuccessful TCM follow-up call:  Unable to reach patient Pt was admitted today.

## 2021-03-21 LAB — CBC
HCT: 37.4 % (ref 36.0–46.0)
Hemoglobin: 12.1 g/dL (ref 12.0–15.0)
MCH: 27.6 pg (ref 26.0–34.0)
MCHC: 32.4 g/dL (ref 30.0–36.0)
MCV: 85.4 fL (ref 80.0–100.0)
Platelets: 220 10*3/uL (ref 150–400)
RBC: 4.38 MIL/uL (ref 3.87–5.11)
RDW: 14 % (ref 11.5–15.5)
WBC: 12.4 10*3/uL — ABNORMAL HIGH (ref 4.0–10.5)
nRBC: 0 % (ref 0.0–0.2)

## 2021-03-21 LAB — RPR: RPR Ser Ql: NONREACTIVE

## 2021-03-21 MED ORDER — IBUPROFEN 600 MG PO TABS: 600.0000 mg | ORAL_TABLET | Freq: Four times a day (QID) | ORAL | 0 refills | Status: DC | PRN

## 2021-03-21 NOTE — Progress Notes (Signed)
Subjective:  She is feeling well. She has no complaints. Pain control is adequate. Voiding without difficulty. Tolerating a regular diet. Ambulating well.  Objective:   Blood pressure 113/82, pulse 92, temperature 98.7 F (37.1 C), temperature source Oral, resp. rate 18, height 5\' 3"  (1.6 m), weight 108.9 kg, last menstrual period 06/19/2020, SpO2 97 %, unknown if currently breastfeeding.  General: NAD Pulmonary: no increased work of breathing Abdomen: non-distended, non-tender Uterus:  fundus firm at U; lochia normal Extremities: no edema, no erythema, no tenderness, no signs of DVT  Results for orders placed or performed during the hospital encounter of 03/20/21 (from the past 72 hour(s))  CBC     Status: Abnormal   Collection Time: 03/20/21  9:36 AM  Result Value Ref Range   WBC 10.6 (H) 4.0 - 10.5 K/uL   RBC 4.63 3.87 - 5.11 MIL/uL   Hemoglobin 13.0 12.0 - 15.0 g/dL   HCT 39.4 36.0 - 46.0 %   MCV 85.1 80.0 - 100.0 fL   MCH 28.1 26.0 - 34.0 pg   MCHC 33.0 30.0 - 36.0 g/dL   RDW 13.7 11.5 - 15.5 %   Platelets 210 150 - 400 K/uL   nRBC 0.0 0.0 - 0.2 %    Comment: Performed at [facility], [address] [place], [place] [address]  Type and screen [place]     Status: None   Collection Time: 03/20/21  9:36 AM  Result Value Ref Range   ABO/RH(D) O POS    Antibody Screen NEG    Sample Expiration      03/23/2021,2359 Performed at [facility], [address] [place], [place] [address]   RPR     Status: None   Collection Time: 03/20/21  9:36 AM  Result Value Ref Range   RPR Ser Ql NON REACTIVE NON REACTIVE    Comment: Performed at [facility], [address], [place], [place] [address]  Resp Panel by RT-PCR (Flu A&B, Covid) Nasopharyngeal Swab     Status: None   Collection Time: 03/20/21  9:37 AM   Specimen: Nasopharyngeal Swab; Nasopharyngeal(NP) swabs in vial transport medium  Result Value Ref Range   SARS  Coronavirus 2 by RT PCR NEGATIVE NEGATIVE    Comment: (NOTE) SARS-CoV-2 target nucleic acids are NOT DETECTED.  The SARS-CoV-2 RNA is generally detectable in upper respiratory specimens during the acute phase of infection. The lowest concentration of SARS-CoV-2 viral copies this assay can detect is 138 copies/mL. A negative result does not preclude SARS-Cov-2 infection and should not be used as the sole basis for treatment or other patient management decisions. A negative result may occur with  improper specimen collection/handling, submission of specimen other than nasopharyngeal swab, presence of viral mutation(s) within the areas targeted by this assay, and inadequate number of viral copies(<138 copies/mL). A negative result must be combined with clinical observations, patient history, and epidemiological information. The expected result is Negative.  Fact Sheet for Patients:  EntrepreneurPulse.com.au  Fact Sheet for Healthcare Providers:  IncredibleEmployment.be  This test is no t yet approved or cleared by the [place] FDA and  has been authorized for detection and/or diagnosis of SARS-CoV-2 by FDA under an Emergency Use Authorization (EUA). This EUA will remain  in effect (meaning this test can be used) for the duration of the COVID-19 declaration under Section 564(b)(1) of the Act, 21 U.S.C.section 360bbb-3(b)(1), unless the authorization is terminated  or revoked sooner.  Influenza A by PCR NEGATIVE NEGATIVE   Influenza B by PCR NEGATIVE NEGATIVE    Comment: (NOTE) The Xpert Xpress SARS-CoV-2/FLU/RSV plus assay is intended as an aid in the diagnosis of influenza from Nasopharyngeal swab specimens and should not be used as a sole basis for treatment. Nasal washings and aspirates are unacceptable for Xpert Xpress SARS-CoV-2/FLU/RSV testing.  Fact Sheet for Patients: EntrepreneurPulse.com.au  Fact Sheet  for Healthcare Providers: IncredibleEmployment.be  This test is not yet approved or cleared by the [place] FDA and has been authorized for detection and/or diagnosis of SARS-CoV-2 by FDA under an Emergency Use Authorization (EUA). This EUA will remain in effect (meaning this test can be used) for the duration of the COVID-19 declaration under Section 564(b)(1) of the Act, 21 U.S.C. section 360bbb-3(b)(1), unless the authorization is terminated or revoked.  Performed at [facility], [address] [place], [place] [address]   CBC     Status: Abnormal   Collection Time: 03/21/21  6:38 AM  Result Value Ref Range   WBC 12.4 (H) 4.0 - 10.5 K/uL   RBC 4.38 3.87 - 5.11 MIL/uL   Hemoglobin 12.1 12.0 - 15.0 g/dL   HCT 37.4 36.0 - 46.0 %   MCV 85.4 80.0 - 100.0 fL   MCH 27.6 26.0 - 34.0 pg   MCHC 32.4 30.0 - 36.0 g/dL   RDW 14.0 11.5 - 15.5 %   Platelets 220 150 - 400 K/uL   nRBC 0.0 0.0 - 0.2 %    Comment: Performed at [facility], [address] [place], [place] [address]    Assessment:   26 y.o. [phone] postpartum day # 1  Plan:   1) Acute blood loss anemia - hemodynamically stable and asymptomatic - po ferrous sulfate  2) Blood Type --/--/O POS (01/20 0936)   3) Rubella 6.12 (06/20 1008) / Varicella Non-immune  4) TDAP status - up to date Immunization History  Administered Date(s) Administered   Influenza,inj,Quad PF,6+ Mos 01/12/2013, 01/01/2014, 11/28/2014, 03/08/2016, 11/03/2017   Meningococcal Conjugate 05/31/2012   Tdap 07/18/2015, 01/05/2018, 01/14/2021    5) Feeding- breast   6) Contraception - condoms  7) Disposition - home today  Adrian Prows MD [place] 03/21/2021 11:35 AM

## 2021-03-23 ENCOUNTER — Telehealth: Payer: Self-pay

## 2021-03-23 NOTE — Telephone Encounter (Signed)
Transition Care Management Follow-up Telephone Call Date of discharge and from where: 03/22/2021-[facility] How have you been since you were released from the hospital? Patient stated she is doing fine Any questions or concerns? No  Items Reviewed: Did the pt receive and understand the discharge instructions provided? Yes  Medications obtained and verified? Yes  Other? No  Any new allergies since your discharge? No  Dietary orders reviewed? No Do you have support at home? Yes   Home Care and Equipment/Supplies: Were home health services ordered? not applicable If so, what is the name of the agency? N/A  Has the agency set up a time to come to the patient's home? not applicable Were any new equipment or medical supplies ordered?  No What is the name of the medical supply agency? N/A Were you able to get the supplies/equipment? not applicable Do you have any questions related to the use of the equipment or supplies? No  Functional Questionnaire: (I = Independent and D = Dependent) ADLs: I  Bathing/Dressing- I  Meal Prep- I  Eating- I  Maintaining continence- I  Transferring/Ambulation- I  Managing Meds- I  Follow up appointments reviewed:  PCP Hospital f/u appt confirmed? No   Specialist Hospital f/u appt confirmed? Yes  Scheduled to see OBGYN on 04-07-96 @ 10:15AM. Are transportation arrangements needed? No  If their condition worsens, is the pt aware to call PCP or go to the Emergency Dept.? Yes Was the patient provided with contact information for the PCP's office or ED? Yes Was to pt encouraged to call back with questions or concerns? Yes

## 2021-03-26 ENCOUNTER — Encounter: Payer: Medicaid Other | Admitting: Obstetrics & Gynecology

## 2021-04-28 ENCOUNTER — Telehealth: Payer: Self-pay

## 2021-04-28 NOTE — Telephone Encounter (Signed)
Pt calling for note to go back to work; 6wk pp appt is 05/01/21. [phone]  Left detailed msg that note can be given at her appt Fri.

## 2021-05-01 ENCOUNTER — Ambulatory Visit (INDEPENDENT_AMBULATORY_CARE_PROVIDER_SITE_OTHER): Payer: Medicaid Other | Admitting: Licensed Practical Nurse

## 2021-05-01 ENCOUNTER — Other Ambulatory Visit: Payer: Self-pay

## 2021-05-03 NOTE — Progress Notes (Signed)
?Postpartum Visit  ?Chief Complaint:  ?Chief Complaint  ?Patient presents with  ? Postpartum Care  ? ? ?History of Present Illness: Patient is a 26 y.o. [id number] presents for postpartum visit. ? ?Date of delivery: 03/20/2021 ?Type of delivery: Vaginal delivery - Vacuum or forceps assisted  no ?Episiotomy No.  ?Laceration: no  ?Pregnancy or labor problems:  no ?Any problems since the delivery:  Hernia. Noticed a bulge near belly button prior to this pregnancy, it is reducible, is not painful but sometimes is uncomfortable, is more noticeable when she is doing certain workouts. She is not sure if she is done  child bearing  ? ?Is pretty sure her cycle has started.  Her PP bleeding stopped for a few days and now she is bleeding like her normal cycle. Is using thin pads ?Breastfeeding on demand, also pumping ?Sleep is good gets at least 5 hours over night ?Mood: is good, admits it "was hard" at first adjusting to 2 kids, her first daughter had a difficult transition to her sibling, has lots of help.  ?Has IC yesterday, it was fine, does not desire hormonal contraception  ?Exercise: doing weight bearing exercises ?Work: went back last week, switched to working at a [employer] has worked there since age 55, was working from home but that became too difficult with children home too.  ? ?Newborn Details:  ?SINGLETON :  ?1. Baby's name: Azalia Bilis. Birth weight: 7lbs 3.7oz ?Maternal Details:  ?Breast Feeding:  yes ?Post partum depression/anxiety noted:  no ?Edinburgh Post-Partum Depression Score:  0  ?Date of last PAP: 2022  normal  ? ?Past Medical History:  ?Diagnosis Date  ? Asthma   ? ? ?Past Surgical History:  ?Procedure Laterality Date  ? CYSTOSCOPY N/A 05/15/2019  ? Procedure: CYSTOSCOPY;  Surgeon: Homero Fellers, MD;  Location: [facility] ORS;  Service: Gynecology;  Laterality: N/A;  ? LAPAROSCOPY  05/15/2019  ? Procedure: LAPAROSCOPY DIAGNOSTIC WITH RIGHT OOPHORECTOMY AND LEFT OVARIAN CYSTECTOMY;   Surgeon: Homero Fellers, MD;  Location: [facility] ORS;  Service: Gynecology;;  ? WISDOM TOOTH EXTRACTION    ? ? ?Prior to Admission medications   ?Medication Sig Start Date End Date Taking? Authorizing Provider  ?ibuprofen (ADVIL) 600 MG tablet Take 1 tablet (600 mg total) by mouth every 6 (six) hours as needed. ?Patient not taking: Reported on 05/01/2021 03/21/21   Homero Fellers, MD  ?Prenat-FeCbn-FeAsp-Meth-FA-DHA (PRENATE MINI) 18-0.6-0.4-350 MG CAPS Take 1 each by mouth daily. ?Patient not taking: Reported on 05/01/2021 08/27/20   Homero Fellers, MD  ? ? ?Allergies  ?Allergen Reactions  ? Vancomycin Itching and Other (See Comments)  ?  Red man syndrome  ? Penicillins Rash  ?  Has patient had a PCN reaction causing immediate rash, facial/tongue/throat swelling, SOB or lightheadedness with hypotension: Unknown ?Has patient had a PCN reaction causing severe rash involving mucus membranes or skin necrosis: Unknown ?Has patient had a PCN reaction that required hospitalization: Unknown ?Has patient had a PCN reaction occurring within the last 10 years: no ?If all of the above answers are "NO", then may proceed with Cephalosporin use. ?  ?  ? ?Social History  ? ?Socioeconomic History  ? Marital status: Significant Other  ?  Spouse name: [name]  ? Number of children: Not on file  ? Years of education: Not on file  ? Highest education level: Not on file  ?Occupational History  ? Occupation: student  ?Tobacco Use  ? Smoking status: Never  ? Smokeless  tobacco: Never  ?Vaping Use  ? Vaping Use: Never used  ?Substance and Sexual Activity  ? Alcohol use: No  ? Drug use: Never  ? Sexual activity: Yes  ?  Birth control/protection: None  ?Other Topics Concern  ? Not on file  ?Social History Narrative  ? Currently attending college.   ? Feels safe in relationship of 2 years.  ? ?Social Determinants of Health  ? ?Financial Resource Strain: Not on file  ?Food Insecurity: Not on file  ?Transportation Needs: Not on file   ?Physical Activity: Not on file  ?Stress: Not on file  ?Social Connections: Not on file  ?Intimate Partner Violence: Not on file  ? ? ?Family History  ?Problem Relation Age of Onset  ? Hyperlipidemia Mother   ? Anxiety disorder Other   ?     Grandmother and mother  ? Depression Other   ?     Grandmother and mother  ? Hyperlipidemia Other   ? Diabetes type II Other   ?     [place] parents  ? Other Other   ?     VSD repaired in grandmother age 2  ? ? ?Review of Systems  ?Constitutional: Negative.   ?Respiratory: Negative.    ?Cardiovascular: Negative.   ?Gastrointestinal: Negative.   ?Genitourinary: Negative.   ?Psychiatric/Behavioral: Negative.     ? ?Physical Exam ?BP 112/60   Ht '5\' 4"'$  (1.626 m)   Wt 219 lb (99.3 kg)   Breastfeeding Yes   BMI 37.59 kg/m?   ?Physical Exam ?Constitutional:   ?   Appearance: Normal appearance.  ?Genitourinary:  ?   Vulva normal.  ?   Genitourinary Comments: Bimanual exam: uterus non gravid, non-tender, adnexa no masses non-tender.   ?Cardiovascular:  ?   Rate and Rhythm: Normal rate and regular rhythm.  ?Pulmonary:  ?   Effort: Pulmonary effort is normal.  ?   Breath sounds: Normal breath sounds.  ?Abdominal:  ?   General: Abdomen is flat.  ?   Palpations: There is no mass.  ?   Tenderness: There is no abdominal tenderness.  ?Musculoskeletal:     ?   General: Normal range of motion.  ?   Cervical back: Normal range of motion.  ?Neurological:  ?   Mental Status: She is alert and oriented to person, place, and time.  ?Skin: ?   General: Skin is warm.  ?Psychiatric:     ?   Mood and Affect: Mood normal.  ? Breasts: lactating, soft no redness or masses, nipples erect and intact bilaterally.  ? ?Assessment: 26 y.o. [id number] presenting for 6 week postpartum visit ? ?Plan: ?Problem List Items Addressed This Visit   ?None ? ? ? ?1) Contraception condoms ? ?2)  Pap ?- ASCCP guidelines and rational discussed.  Patient opts for 3 screening interval Due in 2025  ? ?3) Patient underwent  screening for postpartum depression with No concerns noted. ? ?4) Follow up 1 year for routine annual exam ? ?5) umbilical hernia: If surgical management desired will refer to surgery.  Discussed as long as she is not having pain and is is reducible, she may not need to do anything for it, especially if is not done bearing children. ? ?Roberto Scales, CNM  ?[name], [place]  ?05/03/21  ?1:51 PM  ? ?

## 2021-08-04 ENCOUNTER — Encounter: Payer: Self-pay | Admitting: *Deleted

## 2021-10-16 ENCOUNTER — Ambulatory Visit (INDEPENDENT_AMBULATORY_CARE_PROVIDER_SITE_OTHER): Payer: Medicaid Other

## 2021-10-16 VITALS — BP 100/60 | Wt 220.0 lb

## 2021-10-16 DIAGNOSIS — O209 Hemorrhage in early pregnancy, unspecified: Secondary | ICD-10-CM

## 2021-10-16 DIAGNOSIS — N926 Irregular menstruation, unspecified: Secondary | ICD-10-CM

## 2021-10-16 DIAGNOSIS — Z3201 Encounter for pregnancy test, result positive: Secondary | ICD-10-CM | POA: Diagnosis not present

## 2021-10-16 LAB — POCT URINE PREGNANCY: Preg Test, Ur: POSITIVE — AB

## 2021-10-16 NOTE — Progress Notes (Signed)
Subjective:    Jody Mooney is a 26 y.o. female who presents for evaluation of amenorrhea. She believes she could be pregnant. Pregnancy is desired.  Last period was normal. Pt has had some bleeding. Beta ordered per Alma Friendly, Lab closed today. Pt will come back Monday for labs.   No LMP recorded. The following portions of the patient's history were reviewed and updated as appropriate:   Lab Review Urine HCG: positive    Assessment:    Absence of menstruation.     Plan:    Pregnancy Test:  Positive: EDC: 06/22/22. Briefly discussed positive results and sent to check out for scheduling for New OB appointments.

## 2021-10-19 ENCOUNTER — Other Ambulatory Visit: Payer: Medicaid Other

## 2021-10-19 DIAGNOSIS — O209 Hemorrhage in early pregnancy, unspecified: Secondary | ICD-10-CM | POA: Diagnosis not present

## 2021-10-20 LAB — BETA HCG QUANT (REF LAB): hCG Quant: 4094 m[IU]/mL

## 2021-10-21 ENCOUNTER — Ambulatory Visit (INDEPENDENT_AMBULATORY_CARE_PROVIDER_SITE_OTHER): Payer: Medicaid Other

## 2021-10-21 VITALS — Wt 220.0 lb

## 2021-10-21 DIAGNOSIS — Z369 Encounter for antenatal screening, unspecified: Secondary | ICD-10-CM

## 2021-10-21 DIAGNOSIS — Z3481 Encounter for supervision of other normal pregnancy, first trimester: Secondary | ICD-10-CM

## 2021-10-21 DIAGNOSIS — Z3480 Encounter for supervision of other normal pregnancy, unspecified trimester: Secondary | ICD-10-CM | POA: Insufficient documentation

## 2021-10-21 DIAGNOSIS — Z3689 Encounter for other specified antenatal screening: Secondary | ICD-10-CM

## 2021-10-21 MED ORDER — PRENATE MINI 18-0.6-0.4-350 MG PO CAPS: 1.0000 | ORAL_CAPSULE | Freq: Every day | ORAL | 3 refills | Status: AC

## 2021-10-21 NOTE — Progress Notes (Signed)
New OB Intake  I connected with  Jody Mooney on 10/21/21 at 10:15 AM EDT by telephone and verified that I am speaking with the correct person using two identifiers. Nurse is located at [employer] and pt is located at home.  I explained I am completing New OB Intake today. We discussed her EDD of 05/23/2022 that is based on LMP of 08/16/2021. Pt is G3/P2002. I reviewed her allergies, medications, Medical/Surgical/OB history, and appropriate screenings. Based on history, this is a/an pregnancy uncomplicated .   Patient Active Problem List   Diagnosis Date Noted   Class 2 obesity due to excess calories without serious comorbidity with body mass index (BMI) of 35.0 to 35.9 in adult 04/17/2019   Chest pain 04/17/2019   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   Vaginal discharge 11/08/2017   Encounter for general adult medical examination with abnormal findings 11/03/2011    Concerns addressed today Water birth - adv I thought she could bring her own tub.  Delivery Plans:  Plans to deliver at [facility].  Anatomy US Explained first scheduled [place] will be done for dating and an anatomy scan will be done at 20 weeks.  Labs Discussed genetic screening with patient. Patient declines genetic testing. Discussed possible labs to be drawn at new OB appointment.  COVID Vaccine Patient has not had COVID vaccine.   Social Determinants of Health Food Insecurity: denies food insecurity Transportation: Patient denies transportation needs. Childcare: Discussed no children allowed at ultrasound appointments.   First visit review I reviewed new OB appt with pt. I explained she will have ob bloodwork and pap smear/pelvic exam if indicated. Explained pt will be seen by Dr. Langley Gauss at first visit; encounter routed to appropriate provider.   Cleophas Dunker, [facility] 10/21/2021  10:39 AM  Clinical Staff Provider  Office Location  [facility] Dating    Language  English Anatomy US    Flu  Vaccine  offer Genetic Screen  NIPS:   TDaP vaccine   offer Hgb A1C or  GTT Early : Third trimester :   Covid declines   LAB RESULTS   Rhogam   Blood Type --/--/O POS (01/20 8675)   Feeding Plan breast Antibody NEG (01/20 0936)  Contraception undecided Rubella    Circumcision no RPR NON REACTIVE (01/20 0936)   Pediatrician  [facility]     Support Person [name] HIV Non Reactive (11/10 4492)  Prenatal Classes no Varicella     GBS Positive/-- (12/29 1430)(For PCN allergy, check sensitivities)   BTL Consent  Hep C   VBAC Consent  Pap      Hgb Electro      CF      SMA

## 2021-10-27 ENCOUNTER — Other Ambulatory Visit: Payer: Medicaid Other

## 2021-10-27 DIAGNOSIS — Z3481 Encounter for supervision of other normal pregnancy, first trimester: Secondary | ICD-10-CM | POA: Diagnosis not present

## 2021-10-27 DIAGNOSIS — Z369 Encounter for antenatal screening, unspecified: Secondary | ICD-10-CM | POA: Diagnosis not present

## 2021-10-28 LAB — CBC/D/PLT+RPR+RH+ABO+RUBIGG...
Antibody Screen: NEGATIVE
Basophils Absolute: 0 10*3/uL (ref 0.0–0.2)
Basos: 1 %
EOS (ABSOLUTE): 0.1 10*3/uL (ref 0.0–0.4)
Eos: 2 %
HCV Ab: NONREACTIVE
HIV Screen 4th Generation wRfx: NONREACTIVE
Hematocrit: 41.6 % (ref 34.0–46.6)
Hemoglobin: 13.9 g/dL (ref 11.1–15.9)
Hepatitis B Surface Ag: NEGATIVE
Immature Grans (Abs): 0 10*3/uL (ref 0.0–0.1)
Immature Granulocytes: 0 %
Lymphocytes Absolute: 1.1 10*3/uL (ref 0.7–3.1)
Lymphs: 17 %
MCH: 28.7 pg (ref 26.6–33.0)
MCHC: 33.4 g/dL (ref 31.5–35.7)
MCV: 86 fL (ref 79–97)
Monocytes Absolute: 0.5 10*3/uL (ref 0.1–0.9)
Monocytes: 7 %
Neutrophils Absolute: 4.8 10*3/uL (ref 1.4–7.0)
Neutrophils: 73 %
Platelets: 261 10*3/uL (ref 150–450)
RBC: 4.85 x10E6/uL (ref 3.77–5.28)
RDW: 12.1 % (ref 11.7–15.4)
RPR Ser Ql: NONREACTIVE
Rh Factor: POSITIVE
Rubella Antibodies, IGG: 4.43 index (ref >0.99)
Varicella zoster IgG: <135 index — ABNORMAL LOW (ref >165)
WBC: 6.6 10*3/uL (ref 3.4–10.8)

## 2021-10-28 LAB — HCV INTERPRETATION

## 2021-11-04 ENCOUNTER — Ambulatory Visit (INDEPENDENT_AMBULATORY_CARE_PROVIDER_SITE_OTHER): Payer: Medicaid Other | Admitting: Obstetrics & Gynecology

## 2021-11-04 ENCOUNTER — Encounter: Payer: Self-pay | Admitting: Obstetrics & Gynecology

## 2021-11-04 VITALS — BP 120/70 | HR 66 | Wt 220.8 lb

## 2021-11-04 DIAGNOSIS — Z3481 Encounter for supervision of other normal pregnancy, first trimester: Secondary | ICD-10-CM | POA: Diagnosis not present

## 2021-11-04 DIAGNOSIS — Z3A11 11 weeks gestation of pregnancy: Secondary | ICD-10-CM

## 2021-11-04 LAB — POCT URINALYSIS DIPSTICK OB
Glucose, UA: NEGATIVE
POC,PROTEIN,UA: NEGATIVE

## 2021-11-04 NOTE — Progress Notes (Signed)
  Subjective:    Jody Mooney is a 26 y.o. female being seen today for her obstetrical visit. She is at 83w3dgestation. Patient reports no complaints. Fetal movement:  not yet .  Menstrual History: OB History     Gravida  3   Para  2   Term  2   Preterm      AB      Living  2      SAB      IAB      Ectopic      Multiple  0   Live Births  2           Menarche age: [age]Patient's last menstrual period was 08/16/2021 (approximate).    The following portions of the patient's history were reviewed and updated as appropriate: allergies, current medications, past family history, past medical history, past social history, past surgical history, and problem list.  Review of Systems A comprehensive review of systems was negative.   Objective:     BP 120/70   Pulse 66   Wt 220 lb 12.8 oz (100.2 kg)   LMP 08/16/2021 (Approximate)   BMI 37.90 kg/m  Uterine Size: 12 cm  Pelvic Exam:          Perineum: is normal                Vulva: female escutcheon              Vagina:  normal mucosa                     pH: deferred              Cervix: no cervical motion tenderness          Wet Prep: deferred              Uterus: size consistent with 12 weeks             Adnexa: Not palpable     Bony Pelvis: gynecoid     Assessment:    Pregnancy 11 and 3/7 weeks   Plan:    Problem list reviewed and updated. Labs reviewed. AFP3 discussed:  discussed . Role of ultrasound in pregnancy discussed; fetal survey:  yes . Follow up in 4 weeks. Materniti 21 ordered  CRosario Adie MD  11/04/2021 10:43 AM

## 2021-11-09 LAB — MATERNIT 21 PLUS CORE, BLOOD
Fetal Fraction: 3
Result (T21): NEGATIVE
Trisomy 13 (Patau syndrome): NEGATIVE
Trisomy 18 (Edwards syndrome): NEGATIVE
Trisomy 21 (Down syndrome): NEGATIVE

## 2021-11-19 ENCOUNTER — Ambulatory Visit (INDEPENDENT_AMBULATORY_CARE_PROVIDER_SITE_OTHER): Payer: Medicaid Other

## 2021-11-19 ENCOUNTER — Ambulatory Visit (INDEPENDENT_AMBULATORY_CARE_PROVIDER_SITE_OTHER): Payer: Medicaid Other | Admitting: Obstetrics & Gynecology

## 2021-11-19 ENCOUNTER — Encounter: Payer: Self-pay | Admitting: Obstetrics & Gynecology

## 2021-11-19 DIAGNOSIS — O021 Missed abortion: Secondary | ICD-10-CM | POA: Insufficient documentation

## 2021-11-19 DIAGNOSIS — Z3687 Encounter for antenatal screening for uncertain dates: Secondary | ICD-10-CM

## 2021-11-19 DIAGNOSIS — Z3481 Encounter for supervision of other normal pregnancy, first trimester: Secondary | ICD-10-CM

## 2021-11-19 NOTE — Patient Instructions (Signed)
Miscarriage A miscarriage is the loss of pregnancy before the 20th week. Most miscarriages happen during the first 3 months of pregnancy. Sometimes, a miscarriage can happen before a woman knows that she is pregnant. Having a miscarriage can be an emotional experience. If you have had a miscarriage, talk with your health care provider about any questions you may have about the loss of your baby, the grieving process, and your plans for future pregnancy. What are the causes? Many times, the cause of a miscarriage is not known. What increases the risk? The following factors may make a pregnant woman more likely to have a miscarriage: Certain medical conditions Conditions that affect the hormone balance in the body, such as thyroid disease or polycystic ovary syndrome. Diabetes. Autoimmune disorders. Infections. Bleeding disorders. Obesity. Lifestyle factors Using products with tobacco or nicotine in them or being exposed to tobacco smoke. Having alcohol. Having large amounts of caffeine. Recreational drug use. Problems with reproductive organs or structures Cervical insufficiency. This is when the lowest part of the uterus (cervix) opens and thins before pregnancy is at term. Having a condition called Asherman syndrome. This syndrome causes scarring in the uterus or causes the uterus to be abnormal in structure. Fibrous growths, called fibroids, in the uterus. Congenital abnormalities. These problems are present at birth. Infection of the cervix or uterus. Personal or medical history Injury (trauma). Having had a miscarriage before. Being younger than age 60 or older than age 77. Exposure to harmful substances in the environment. This may include radiation or heavy metals, such as lead. Use of certain medicines. What are the signs or symptoms? Symptoms of this condition include: Vaginal bleeding or spotting, with or without cramps or pain. Pain or cramping in the abdomen or lower  back. Fluid or tissue coming out of the vagina. How is this diagnosed? This condition may be diagnosed based on: A physical exam. Ultrasound. Lab tests, such as blood tests, urine tests, or swabs for infection. How is this treated? Treatment for a miscarriage is sometimes not needed if all the pregnancy tissue that was in the uterus comes out on its own, and there are no other problems such as infection or heavy bleeding. In other cases, this condition may be treated with: Dilation and curettage (D&C). In this procedure, the cervix is stretched open and any remaining pregnancy tissue is removed from the lining of the uterus (endometrium). Medicines. These may include: Antibiotic medicine, to treat infection. Medicine to help any remaining pregnancy tissue come out of the body. Medicine to reduce (contract) the size of the uterus. These medicines may be given if there is a lot of bleeding. If you have Rh-negative blood, you may be given an injection of a medicine called Rho(D) immune globulin. This medicine helps prevent problems with future pregnancies. Follow these instructions at home: Medicines Take over-the-counter and prescription medicines only as told by your health care provider. If you were prescribed antibiotic medicine, take it as told by your health care provider. Do not stop taking the antibiotic even if you start to feel better. Activity Rest as told by your health care provider. Ask your health care provider what activities are safe for you. Have someone help with home and family responsibilities during this time. General instructions  Monitor how much tissue or blood clot material comes out of the vagina. Do not have sex, douche, or put anything, such as tampons, in your vagina until your health care provider says it is okay. To help you  and your partner with the grieving process, talk with your health care provider or get counseling. When you are ready, meet with your  health care provider to discuss any important steps you should take for your health. Also, discuss steps you should take to have a healthy pregnancy in the future. Keep all follow-up visits. This is important. Where to find more information [employer] of [employer]stetricians and Gynecologists: acog.org U.[place]. Department of Health and Programmer, systems of Women's Health: EverydayCosmetics.no Contact a health care provider if: You have a fever or chills. There is bad-smelling fluid coming from the vagina. You have more bleeding instead of less. Tissue or blood clots come out of your vagina. Get help right away if: You have severe cramps or pain in your back or abdomen. Heavy bleeding soaks through 2 large sanitary pads an hour for more than 2 hours. You become light-headed or weak. You faint. You feel sad, and your sadness takes over your thoughts. You think about hurting yourself. If you ever feel like you may hurt yourself or others, or have thoughts about taking your own life, get help right away. Go to your nearest emergency department or: Call your local emergency services (911 in the [place] Call a suicide crisis helpline, such as the [place] at (626) 714-1917 or 988 in the [place]. This is open 24 hours a day in the [place] Text the Crisis Text Line at 678 384 8547 (in the [place].). Summary Most miscarriages happen in the first 3 months of pregnancy. Sometimes miscarriage happens before a woman knows that she is pregnant. Follow instructions from your health care provider about medicines and activity. To help you and your partner with grieving, talk with your health care provider or get counseling. Keep all follow-up visits. This information is not intended to replace advice given to you by your health care provider. Make sure you discuss any questions you have with your health care provider. Document Revised: 09/10/2020 Document Reviewed:  08/17/2019 Elsevier Patient Education  [place].

## 2021-11-19 NOTE — Progress Notes (Signed)
26 y/o G3 P2002 at 36 4/[redacted] wks ega, found to be with a fetal demise at [redacted] wks ega Discussed Misoprostal, D&C, and expectant management.  Pt will decide, and call us back.  Bleeding precautions given Patient provided with grief resources. Condolences for loss were offered.   The patient was counseled that eghty percent (80%) of people who have a miscarriage will place a pregnancy completely in 8 weeks without intervention. Taking cytotec will increase this percentage to 85-90%.  Taking cytotec at home can be associated with uterine bleeding and cramping which the patient will need to be prepared to have within 1-4 hours of taking the medication. A support person should be present in case of an emergency. This is an affordable and safe alternative to a dilation and evacuation if precautions are taken.  A patient will need to follow up 2-4 weeks after taking cytotec to make sure that the pregnancy has completely passed. Another option for management of a incomplete or missed abortion is a dilation and evacuation. Having a dilation and evacuation of the pregnancy will result in a nearly 100% removal of the tissue. Complications such as hemorrhage, infection, uterine perforation, retained tissue or uterine scarring are possible but not common. Risks and benefits of each option discussed. Patient elected for waiting a few days..      Pt will call us later today or Monday 11-23-21  Rosario Adie, MD  11/19/2021 12:07 PM

## 2021-11-20 ENCOUNTER — Telehealth: Payer: Self-pay

## 2021-11-20 MED ORDER — MISOPROSTOL 200 MCG PO TABS: ORAL_TABLET | ORAL | 1 refills | Status: DC

## 2021-11-20 MED ORDER — IBUPROFEN 800 MG PO TABS: 800.0000 mg | ORAL_TABLET | Freq: Three times a day (TID) | ORAL | 0 refills | Status: DC | PRN

## 2021-11-20 MED ORDER — OXYCODONE-ACETAMINOPHEN 5-325 MG PO TABS: 1.0000 | ORAL_TABLET | Freq: Four times a day (QID) | ORAL | 0 refills | Status: DC | PRN

## 2021-11-20 NOTE — Telephone Encounter (Signed)
Pt is scheduled with Dr. Langley Gauss on Oct. 5 at 9:55.

## 2021-11-20 NOTE — Telephone Encounter (Signed)
Pt calling; found out yesterday she is miscarrying; would like the pills that was offered to her to help her pass the baby.  [phone]

## 2021-11-20 NOTE — Telephone Encounter (Addendum)
Contacted patient regarding request for use of Cytotec for medical management of missed abortion.  Reviewed bleeding precautions with patient, and instructions for use.  I advised that if she has not had any bleeding within 12 to 24 hours to repeat the dose.  She will also need to have follow-up scheduled in 1 to 2 weeks in the office.  Patient notes understanding of all things discussed today. Recent labs reviewed:   Lab Results  Component Value Date   ABORH O POS 03/20/2021    Lab Results  Component Value Date   WBC 6.6 10/27/2021   HGB 13.9 10/27/2021   HCT 41.6 10/27/2021   MCV 86 10/27/2021   PLT 261 10/27/2021      Rubie Maid, MD [facility]

## 2021-11-20 NOTE — Telephone Encounter (Signed)
I have contacted this patient and prescribed her medications. Please arrange for follow up in office after miscarriage with a provider in 10-14 days.

## 2021-12-02 ENCOUNTER — Encounter: Payer: Medicaid Other | Admitting: Licensed Practical Nurse

## 2021-12-02 ENCOUNTER — Ambulatory Visit: Payer: Medicaid Other | Admitting: Licensed Practical Nurse

## 2021-12-02 NOTE — Progress Notes (Unsigned)
TC to pt:  Pt has SAB fu scheduled needed to cancel as her Daughter came in contact with RSV, so she needed to go to the Pediatrician  Dr Marcelline Mates prescribed Cytotec, passed some tissue like twice and then her bleeding slowed down.  Currently her Bleeding is very light,  Emotionally ok,  Will reschedule apt as she wishes to discuss some questions she has Roberto Scales, [place], [facility]  12/02/21  12:02 PM

## 2021-12-03 ENCOUNTER — Ambulatory Visit: Payer: Medicaid Other | Admitting: Obstetrics & Gynecology

## 2021-12-14 ENCOUNTER — Ambulatory Visit: Payer: Medicaid Other | Admitting: Advanced Practice Midwife

## 2021-12-21 ENCOUNTER — Ambulatory Visit: Payer: Medicaid Other | Admitting: Licensed Practical Nurse

## 2021-12-28 ENCOUNTER — Ambulatory Visit: Payer: Medicaid Other | Admitting: Advanced Practice Midwife

## 2022-01-28 ENCOUNTER — Telehealth: Payer: Self-pay

## 2022-01-28 NOTE — Telephone Encounter (Signed)
Please let me know when pt returns my call. Returning her triage VM

## 2022-01-29 ENCOUNTER — Telehealth: Payer: Self-pay

## 2022-01-29 ENCOUNTER — Other Ambulatory Visit: Payer: Medicaid Other

## 2022-01-29 DIAGNOSIS — Z3200 Encounter for pregnancy test, result unknown: Secondary | ICD-10-CM

## 2022-01-29 DIAGNOSIS — R7989 Other specified abnormal findings of blood chemistry: Secondary | ICD-10-CM

## 2022-01-29 NOTE — Telephone Encounter (Signed)
Pt called triage reporting a positive pregnancy test states she had a miscarriage in September. So she's not sure if this is a positive from the past pregnancy or is this a new one. She states her last period was on 10/30. No period in November. I ordered a Beta to see what her levels are.

## 2022-01-30 ENCOUNTER — Emergency Department: Payer: Medicaid Other

## 2022-01-30 ENCOUNTER — Other Ambulatory Visit: Payer: Self-pay

## 2022-01-30 ENCOUNTER — Emergency Department
Admission: EM | Admit: 2022-01-30 | Discharge: 2022-01-30 | Disposition: A | Discharge status: Home / Self Care | Payer: Medicaid Other | Attending: Emergency Medicine | Admitting: Emergency Medicine

## 2022-01-30 DIAGNOSIS — Z6740 Type O blood, Rh positive: Secondary | ICD-10-CM | POA: Insufficient documentation

## 2022-01-30 DIAGNOSIS — O368910 Maternal care for other specified fetal problems, first trimester, not applicable or unspecified: Secondary | ICD-10-CM | POA: Insufficient documentation

## 2022-01-30 DIAGNOSIS — R188 Other ascites: Secondary | ICD-10-CM | POA: Diagnosis not present

## 2022-01-30 DIAGNOSIS — Z3A01 Less than 8 weeks gestation of pregnancy: Secondary | ICD-10-CM | POA: Diagnosis not present

## 2022-01-30 DIAGNOSIS — O208 Other hemorrhage in early pregnancy: Secondary | ICD-10-CM | POA: Diagnosis not present

## 2022-01-30 DIAGNOSIS — O26891 Other specified pregnancy related conditions, first trimester: Secondary | ICD-10-CM | POA: Diagnosis not present

## 2022-01-30 DIAGNOSIS — O200 Threatened abortion: Secondary | ICD-10-CM | POA: Diagnosis not present

## 2022-01-30 DIAGNOSIS — O209 Hemorrhage in early pregnancy, unspecified: Secondary | ICD-10-CM | POA: Diagnosis present

## 2022-01-30 LAB — CBC WITH DIFFERENTIAL/PLATELET
Abs Immature Granulocytes: 0.01 10*3/uL (ref 0.00–0.07)
Basophils Absolute: 0 10*3/uL (ref 0.0–0.1)
Basophils Relative: 0 %
Eosinophils Absolute: 0.1 10*3/uL (ref 0.0–0.5)
Eosinophils Relative: 2 %
HCT: 39.6 % (ref 36.0–46.0)
Hemoglobin: 13.2 g/dL (ref 12.0–15.0)
Immature Granulocytes: 0 %
Lymphocytes Relative: 17 %
Lymphs Abs: 1.1 10*3/uL (ref 0.7–4.0)
MCH: 28.9 pg (ref 26.0–34.0)
MCHC: 33.3 g/dL (ref 30.0–36.0)
MCV: 86.7 fL (ref 80.0–100.0)
Monocytes Absolute: 0.5 10*3/uL (ref 0.1–1.0)
Monocytes Relative: 7 %
Neutro Abs: 4.9 10*3/uL (ref 1.7–7.7)
Neutrophils Relative %: 74 %
Platelets: 261 10*3/uL (ref 150–400)
RBC: 4.57 MIL/uL (ref 3.87–5.11)
RDW: 12.5 % (ref 11.5–15.5)
WBC: 6.6 10*3/uL (ref 4.0–10.5)
nRBC: 0 % (ref 0.0–0.2)

## 2022-01-30 LAB — BASIC METABOLIC PANEL
Anion gap: 7 (ref 5–15)
BUN: 8 mg/dL (ref 6–20)
CO2: 22 mmol/L (ref 22–32)
Calcium: 8.5 mg/dL — ABNORMAL LOW (ref 8.9–10.3)
Chloride: 109 mmol/L (ref 98–111)
Creatinine, Ser: 0.65 mg/dL (ref 0.44–1.00)
GFR, Estimated: >60 mL/min (ref >60)
Glucose, Bld: 102 mg/dL — ABNORMAL HIGH (ref 70–99)
Potassium: 3.6 mmol/L (ref 3.5–5.1)
Sodium: 138 mmol/L (ref 135–145)

## 2022-01-30 LAB — BETA HCG QUANT (REF LAB): hCG Quant: 13023 m[IU]/mL

## 2022-01-30 LAB — HCG, QUANTITATIVE, PREGNANCY: hCG, Beta Chain, Quant, S: 20602 m[IU]/mL — ABNORMAL HIGH (ref <5)

## 2022-01-30 LAB — ABO/RH: ABO/RH(D): O POS

## 2022-01-30 NOTE — ED Triage Notes (Signed)
Pt states she she had miscarriage in Sept- pt took a pregnancy test 2 days ago and was positive- pt went and had blood work at her OB and her HCG was also positive- pt states this morning she had some bleeding when she wiped- pt states she was also straining to have a BM- pt states yesterday but she had some very mild cramping- pt states after her BM her cramping went away

## 2022-01-30 NOTE — ED Provider Notes (Signed)
[facility] Provider Note    Event Date/Time   First MD Initiated Contact with Patient 01/30/22 1341     (approximate)   History   Vaginal Bleeding   HPI  Jody Mooney is a 26 y.o. female   Past medical history of Missed abortion status post medical management and passing of fetal products at the end of September 2023, last menstrual period end of October, positive pregnancy test and scant vaginal bleeding this week. She denies abdominal pain or urinary symptoms.  History was obtained via patient and a review of external medical notes including telephone encounters at the end of September 2023 for medical management of missed abortion.      Physical Exam   Triage Vital Signs: ED Triage Vitals  Enc Vitals Group     BP 01/30/22 1136 118/72     Pulse Rate 01/30/22 1136 78     Resp 01/30/22 1136 18     Temp 01/30/22 1136 98.5 F (36.9 C)     Temp Source 01/30/22 1136 Oral     SpO2 01/30/22 1136 100 %     Weight 01/30/22 1137 212 lb (96.2 kg)     Height 01/30/22 1137 '5\' 3"'$  (1.6 m)     Head Circumference --      Peak Flow --      Pain Score 01/30/22 1136 0     Pain Loc --      Pain Edu? --      Excl. in [place]? --     Most recent vital signs: Vitals:   01/30/22 1136  BP: 118/72  Pulse: 78  Resp: 18  Temp: 98.5 F (36.9 C)  SpO2: 100%    General: Awake, no distress.  CV:  Good peripheral perfusion.  Resp:  Normal effort.  Abd:  No distention.  Other:  Abdomen is soft and nontender.  She defers pelvic exam at this time.   ED Results / Procedures / Treatments   Labs (all labs ordered are listed, but only abnormal results are displayed) Labs Reviewed  BASIC METABOLIC PANEL - Abnormal; Notable for the following components:      Result Value   Glucose, Bld 102 (*)    Calcium 8.5 (*)    All other components within normal limits  HCG, QUANTITATIVE, PREGNANCY - Abnormal; Notable for the following components:   hCG, Beta Chain, Quant,  S 20,602 (*)    All other components within normal limits  CBC WITH DIFFERENTIAL/PLATELET  ABO/RH     I reviewed labs and they are notable for beta hCG quantitative is 20,600    RADIOLOGY I independently reviewed and interpreted ultrasound transvaginal and seen intrauterine pregnancy.   PROCEDURES:  Critical Care performed: No  Procedures   MEDICATIONS ORDERED IN ED: Medications - No data to display   IMPRESSION / MDM / [place] / ED COURSE  I reviewed the triage vital signs and the nursing notes.                              Differential diagnosis includes, but is not limited to, threatened miscarriage, vaginal bleeding, fibroids, missed abortion  MDM: This patient has a 6-week intrauterine live pregnancy with subchorionic bleeding and a normal H&H and normal hemodynamics and is comfortable with a benign abdominal exam.  This is a threatened miscarriage and she is Rh+ so no RhoGAM.  She will follow-up with gynecology  this week.  I advise urinalysis but patient does not want to stay stating that she will get it done at her gynecologist this week.   Patient's presentation is most consistent with acute presentation with potential threat to life or bodily function.       FINAL CLINICAL IMPRESSION(S) / ED DIAGNOSES   Final diagnoses:  Threatened miscarriage     Rx / DC Orders   ED Discharge Orders     None        Note:  This document was prepared using Dragon voice recognition software and may include unintentional dictation errors.    Lucillie Garfinkel, MD 01/30/22 [phone]

## 2022-01-30 NOTE — ED Provider Triage Note (Signed)
Emergency Medicine Provider Triage Evaluation Note  Jody Mooney , a 26 y.o. female  was evaluated in triage.  Pt complains of vaginal bleeding. Beta HCG was positive at GYN office 2 days ago. She had a bowel movement this morning and had blood on the toilet paper. Recent miscarriage in September .  Physical Exam  There were no vitals taken for this visit. Gen:   Awake, no distress   Resp:  Normal effort  MSK:   Moves extremities without difficulty  Other:    Medical Decision Making  Medically screening exam initiated at 11:35 AM.  Appropriate orders placed.  Jody Mooney was informed that the remainder of the evaluation will be completed by another provider, this initial triage assessment does not replace that evaluation, and the importance of remaining in the ED until their evaluation is complete.    Victorino Dike, FNP 01/30/22 1138

## 2022-01-30 NOTE — Discharge Instructions (Signed)
Follow up with your gynecologist for ongoing pregnancy care and monitoring of your bleeding.  Make sure you get a urine test then.  Thank you for choosing [place] for your health care today!  Please see your primary doctor this week for a follow up appointment.   If you do not have a primary doctor call the following clinics to establish care:  If you have insurance:  [facility] [phone] [place] [address]   [facility]  [phone] [address] [place] [address]   If you do not have insurance:  [facility]  [phone] [address], [place] [address]  Sometimes, in the early stages of certain disease courses it is difficult to detect in the emergency department evaluation -- so, it is important that you continue to monitor your symptoms and call your doctor right away or return to the emergency department if you develop any new or worsening symptoms.  It was my pleasure to care for you today.   Hoover Brunette Jacelyn Grip, MD

## 2022-02-01 ENCOUNTER — Telehealth: Payer: Self-pay | Admitting: Obstetrics & Gynecology

## 2022-02-01 ENCOUNTER — Telehealth: Payer: Self-pay | Admitting: Licensed Clinical Social Worker

## 2022-02-01 NOTE — Patient Outreach (Signed)
Transition Care Management Unsuccessful Follow-up Telephone Call  Date of discharge and from where:  01/30/22 from [facility]  Attempts:  1st Attempt  Reason for unsuccessful TCM follow-up call:  Left voice message  Eula Fried, BSW, MSW, [employer] Managed Medicaid [email or web address] Phone: [phone]

## 2022-02-01 NOTE — Telephone Encounter (Signed)
I contacted patient via phone and left message for patient to call back to confirm scheduled dates and time per Secure message for [facility] to scheduled NOB appointments. Patient was seen in ER on 01/30/22.

## 2022-02-15 ENCOUNTER — Ambulatory Visit: Payer: Medicaid Other

## 2022-02-16 ENCOUNTER — Encounter: Payer: Self-pay | Admitting: Certified Nurse Midwife

## 2022-02-16 ENCOUNTER — Ambulatory Visit (INDEPENDENT_AMBULATORY_CARE_PROVIDER_SITE_OTHER): Payer: Medicaid Other | Admitting: Certified Nurse Midwife

## 2022-02-16 VITALS — BP 126/79 | HR 76 | Wt 217.6 lb

## 2022-02-16 DIAGNOSIS — O200 Threatened abortion: Secondary | ICD-10-CM | POA: Diagnosis not present

## 2022-02-16 DIAGNOSIS — Z3A01 Less than 8 weeks gestation of pregnancy: Secondary | ICD-10-CM | POA: Diagnosis not present

## 2022-02-16 DIAGNOSIS — O209 Hemorrhage in early pregnancy, unspecified: Secondary | ICD-10-CM

## 2022-02-16 NOTE — Patient Instructions (Signed)
Vaginal Bleeding During Pregnancy, First Trimester A small amount of bleeding from the vagina is common during early pregnancy. This kind of bleeding is also called spotting. Sometimes the bleeding is normal and does not cause problems. At other times, though, bleeding may be a sign of something serious. Normal bleeding in pregnancy can happen: When the fertilized egg attaches itself to your womb. When blood vessels change because of the pregnancy. When you have pelvic exams. When you have sex. Abnormal bleeding can happen: When you have an infection. When you have growths in your womb. The growths are called polyps. If you are having a miscarriage or at risk of having one. If you have other problems in your pregnancy. Tell your doctor right away about any bleeding from your vagina. Follow these instructions at home: Watch your bleeding  Watch your condition for any changes. Let your doctor know if you are worried about something. Try to know what causes your bleeding. Ask yourself these questions: Does the bleeding start on its own? Does the bleeding start after something is done, such as sex or a pelvic exam? Use a diary to write the things you see about your bleeding. Write in your diary: If the bleeding flows freely without stopping, or if it starts and stops, and then starts again. If the bleeding is heavy or light. How many pads you use in a day and how much blood is in them. Tell your doctor if you pass tissue. He or she may want to see it. Activity Follow your doctor's instructions about how active you can be. Ask what activities are safe for you. Do not have sex or orgasms until your doctor says that this is safe. If needed, make plans for someone to help with your normal activities. General instructions Take over-the-counter and prescription medicines only as told by your doctor. Do not take aspirin because it can cause bleeding. Do not use tampons. Do not douche. Keep all  follow-up visits. Contact a doctor if: You have vaginal bleeding at any time while you are pregnant. You have cramps. You have a fever or chills. Get help right away if: You have very bad cramps in your back or belly (abdomen). You pass large clots or a lot of tissue from your vagina. Your bleeding gets worse. You feel light-headed. You feel weak. You pass out (faint). You have chills. You are leaking fluid from your vagina. You have a gush of fluid from your vagina. Summary Sometimes vaginal bleeding during pregnancy is normal and does not cause problems. At other times, bleeding may be a sign of something serious. Tell your doctor right away about any bleeding from your vagina. Follow your doctor's instructions about how active you can be. You may need someone to help you with your normal activities. Keep all follow-up visits. This information is not intended to replace advice given to you by your health care provider. Make sure you discuss any questions you have with your health care provider. Document Revised: 11/08/2019 Document Reviewed: 11/08/2019 Elsevier Patient Education  [place].

## 2022-02-16 NOTE — Progress Notes (Signed)
Subjective:    Jody Mooney is a 26 y.o. female. Follow up from the ED. Pt in ED 01/30/22 seen Jody Mooney reports scant vaginal bleeding  that has resolved. She state she will have some spotting when straining to have a BM. She is not in acute distress. Ectopic risks: none.  The following portions of the patient's history were reviewed and updated as appropriate: allergies, current medications, past family history, past medical history, past social history, past surgical history, and problem list.  Review of Systems Pertinent items are noted in HPI.   Objective:     LMP  (LMP Unknown)  General:   alert, cooperative, and appears stated age  Heart: regular rate and rhythm  Abdomen: soft, non-tender, without masses or organomegaly and soft  Pelvic: Exam deferred.   Imaging 01/30/22 u/s from ED Narrative & Impression  CLINICAL DATA:  26 year old pregnant female with vaginal bleeding.   EXAM: OBSTETRIC <14 WK [place] AND TRANSVAGINAL OB US   TECHNIQUE: Both transabdominal and transvaginal ultrasound examinations were performed for complete evaluation of the gestation as well as the maternal uterus, adnexal regions, and pelvic cul-de-sac. Transvaginal technique was performed to assess early pregnancy.   COMPARISON:  None Available.   FINDINGS: Intrauterine gestational sac: Single   Yolk sac:  Visualized.   Embryo:  Visualized.   Cardiac Activity: Visualized.   Heart Rate: 107 bpm   CRL:  4.4 mm   6 w   1 d                  [place] EDC:   Subchorionic hemorrhage:  A small subchorionic hemorrhage noted.   Maternal uterus/adnexae: RIGHT ovary not visualized. LEFT ovary is unremarkable.   No adnexal mass.   Trace free pelvic fluid noted.   IMPRESSION: Single living intrauterine gestation with estimated gestational age of [redacted] weeks 1 day by this ultrasound. Small subchorionic hemorrhage.     Electronically Signed   By: Margarette Canada M.D.   On: 01/30/2022 14:58  .  01/30/22 Labs:   hCG, Beta Chain, Quant, S <5 mIU/mL 20,602 High     Assessment:      Threatened abortion   Plan:    Beta Hcg today, u/s ordered for dating/viability. Discussed nurse visit @ 10 wks and NOB physical at 12 wks. She verbalize and agrees to plan.  Philip Aspen, CNM

## 2022-02-17 ENCOUNTER — Other Ambulatory Visit: Payer: Self-pay

## 2022-02-17 ENCOUNTER — Ambulatory Visit (INDEPENDENT_AMBULATORY_CARE_PROVIDER_SITE_OTHER): Payer: Medicaid Other

## 2022-02-17 ENCOUNTER — Encounter: Payer: Self-pay | Admitting: Certified Nurse Midwife

## 2022-02-17 VITALS — Wt 215.0 lb

## 2022-02-17 DIAGNOSIS — Z369 Encounter for antenatal screening, unspecified: Secondary | ICD-10-CM

## 2022-02-17 DIAGNOSIS — Z3481 Encounter for supervision of other normal pregnancy, first trimester: Secondary | ICD-10-CM

## 2022-02-17 DIAGNOSIS — Z3480 Encounter for supervision of other normal pregnancy, unspecified trimester: Secondary | ICD-10-CM

## 2022-02-17 DIAGNOSIS — Z3689 Encounter for other specified antenatal screening: Secondary | ICD-10-CM

## 2022-02-17 LAB — BETA HCG QUANT (REF LAB): hCG Quant: 89280 m[IU]/mL

## 2022-02-17 MED ORDER — PRENATE MINI 18-0.6-0.4-350 MG PO CAPS: 1.0000 | ORAL_CAPSULE | Freq: Every day | ORAL | 11 refills | Status: DC

## 2022-02-17 NOTE — Patient Instructions (Signed)
First Trimester of Pregnancy  The first trimester of pregnancy starts on the first day of your last menstrual period until the end of week 12. This is also called months 1 through 3 of pregnancy. Body changes during your first trimester Your body goes through many changes during pregnancy. The changes usually return to normal after your baby is born. Physical changes You may gain or lose weight. Your breasts may grow larger and hurt. The area around your nipples may get darker. Dark spots or blotches may develop on your face. You may have changes in your hair. Health changes You may feel like you might vomit (nauseous), and you may vomit. You may have heartburn. You may have headaches. You may have trouble pooping (constipation). Your gums may bleed. Other changes You may get tired easily. You may pee (urinate) more often. Your menstrual periods will stop. You may not feel hungry. You may want to eat certain kinds of food. You may have changes in your emotions from day to day. You may have more dreams. Follow these instructions at home: Medicines Take over-the-counter and prescription medicines only as told by your doctor. Some medicines are not safe during pregnancy. Take a prenatal vitamin that contains at least 600 micrograms (mcg) of folic acid. Eating and drinking Eat healthy meals that include: Fresh fruits and vegetables. Whole grains. Good sources of protein, such as meat, eggs, or tofu. Low-fat dairy products. Avoid raw meat and unpasteurized juice, milk, and cheese. If you feel like you may vomit, or you vomit: Eat 4 or 5 small meals a day instead of 3 large meals. Try eating a few soda crackers. Drink liquids between meals instead of during meals. You may need to take these actions to prevent or treat trouble pooping: Drink enough fluids to keep your pee (urine) pale yellow. Eat foods that are high in fiber. These include beans, whole grains, and fresh fruits and  vegetables. Limit foods that are high in fat and sugar. These include fried or sweet foods. Activity Exercise only as told by your doctor. Most people can do their usual exercise routine during pregnancy. Stop exercising if you have cramps or pain in your lower belly (abdomen) or low back. Do not exercise if it is too hot or too humid, or if you are in a place of great height (high altitude). Avoid heavy lifting. If you choose to, you may have sex unless your doctor tells you not to. Relieving pain and discomfort Wear a good support bra if your breasts are sore. Rest with your legs raised (elevated) if you have leg cramps or low back pain. If you have bulging veins (varicose veins) in your legs: Wear support hose as told by your doctor. Raise your feet for 15 minutes, 3-4 times a day. Limit salt in your food. Safety Wear your seat belt at all times when you are in a car. Talk with your doctor if someone is hurting you or yelling at you. Talk with your doctor if you are feeling sad or have thoughts of hurting yourself. Lifestyle Do not use hot tubs, steam rooms, or saunas. Do not douche. Do not use tampons or scented sanitary pads. Do not use herbal medicines, illegal drugs, or medicines that are not approved by your doctor. Do not drink alcohol. Do not smoke or use any products that contain nicotine or tobacco. If you need help quitting, ask your doctor. Avoid cat litter boxes and soil that is used by cats. These carry  germs that can cause harm to the baby and can cause a loss of your baby by miscarriage or stillbirth. General instructions Keep all follow-up visits. This is important. Ask for help if you need counseling or if you need help with nutrition. Your doctor can give you advice or tell you where to go for help. Visit your dentist. At home, brush your teeth with a soft toothbrush. Floss gently. Write down your questions. Take them to your prenatal visits. Where to find more  information American Pregnancy Association: americanpregnancy.org American College of Obstetricians and Gynecologists: www.acog.org Office on Women's Health: womenshealth.gov/pregnancy Contact a doctor if: You are dizzy. You have a fever. You have mild cramps or pressure in your lower belly. You have a nagging pain in your belly area. You continue to feel like you may vomit, you vomit, or you have watery poop (diarrhea) for 24 hours or longer. You have a bad-smelling fluid coming from your vagina. You have pain when you pee. You are exposed to a disease that spreads from person to person, such as chickenpox, measles, Zika virus, HIV, or hepatitis. Get help right away if: You have spotting or bleeding from your vagina. You have very bad belly cramping or pain. You have shortness of breath or chest pain. You have any kind of injury, such as from a fall or a car crash. You have new or increased pain, swelling, or redness in an arm or leg. Summary The first trimester of pregnancy starts on the first day of your last menstrual period until the end of week 12 (months 1 through 3). Eat 4 or 5 small meals a day instead of 3 large meals. Do not smoke or use any products that contain nicotine or tobacco. If you need help quitting, ask your doctor. Keep all follow-up visits. This information is not intended to replace advice given to you by your health care provider. Make sure you discuss any questions you have with your health care provider. Document Revised: 07/25/2019 Document Reviewed: 05/31/2019 Elsevier Patient Education  2023 Elsevier Inc. Commonly Asked Questions During Pregnancy  Cats: A parasite can be excreted in cat feces.  To avoid exposure you need to have another person empty the little box.  If you must empty the litter box you will need to wear gloves.  Wash your hands after handling your cat.  This parasite can also be found in raw or undercooked meat so this should also be  avoided.  Colds, Sore Throats, Flu: Please check your medication sheet to see what you can take for symptoms.  If your symptoms are unrelieved by these medications please call the office.  Dental Work: Most any dental work your dentist recommends is permitted.  X-rays should only be taken during the first trimester if absolutely necessary.  Your abdomen should be shielded with a lead apron during all x-rays.  Please notify your provider prior to receiving any x-rays.  Novocaine is fine; gas is not recommended.  If your dentist requires a note from us prior to dental work please call the office and we will provide one for you.  Exercise: Exercise is an important part of staying healthy during your pregnancy.  You may continue most exercises you were accustomed to prior to pregnancy.  Later in your pregnancy you will most likely notice you have difficulty with activities requiring balance like riding a bicycle.  It is important that you listen to your body and avoid activities that put you at a higher   risk of falling.  Adequate rest and staying well hydrated are a must!  If you have questions about the safety of specific activities ask your provider.    Exposure to Children with illness: Try to avoid obvious exposure; report any symptoms to [place] when noted,  If you have chicken pos, red measles or mumps, you should be immune to these diseases.   Please do not take any vaccines while pregnant unless you have checked with your OB provider.  Fetal Movement: After 28 weeks we recommend you do "kick counts" twice daily.  Lie or sit down in a calm quiet environment and count your baby movements "kicks".  You should feel your baby at least 10 times per hour.  If you have not felt 10 kicks within the first hour get up, walk around and have something sweet to eat or drink then repeat for an additional hour.  If count remains less than 10 per hour notify your provider.  Fumigating: Follow your pest control agent's  advice as to how long to stay out of your home.  Ventilate the area well before re-entering.  Hemorrhoids:   Most over-the-counter preparations can be used during pregnancy.  Check your medication to see what is safe to use.  It is important to use a stool softener or fiber in your diet and to drink lots of liquids.  If hemorrhoids seem to be getting worse please call the office.   Hot Tubs:  Hot tubs Jacuzzis and saunas are not recommended while pregnant.  These increase your internal body temperature and should be avoided.  Intercourse:  Sexual intercourse is safe during pregnancy as long as you are comfortable, unless otherwise advised by your provider.  Spotting may occur after intercourse; report any bright red bleeding that is heavier than spotting.  Labor:  If you know that you are in labor, please go to the hospital.  If you are unsure, please call the office and let us help you decide what to do.  Lifting, straining, etc:  If your job requires heavy lifting or straining please check with your provider for any limitations.  Generally, you should not lift items heavier than that you can lift simply with your hands and arms (no back muscles)  Painting:  Paint fumes do not harm your pregnancy, but may make you ill and should be avoided if possible.  Latex or water based paints have less odor than oils.  Use adequate ventilation while painting.  Permanents & Hair Color:  Chemicals in hair dyes are not recommended as they cause increase hair dryness which can increase hair loss during pregnancy.  " Highlighting" and permanents are allowed.  Dye may be absorbed differently and permanents may not hold as well during pregnancy.  Sunbathing:  Use a sunscreen, as skin burns easily during pregnancy.  Drink plenty of fluids; avoid over heating.  Tanning Beds:  Because their possible side effects are still unknown, tanning beds are not recommended.  Ultrasound Scans:  Routine ultrasounds are performed  at approximately 20 weeks.  You will be able to see your baby's general anatomy an if you would like to know the gender this can usually be determined as well.  If it is questionable when you conceived you may also receive an ultrasound early in your pregnancy for dating purposes.  Otherwise ultrasound exams are not routinely performed unless there is a medical necessity.  Although you can request a scan we ask that you pay for it when  conducted because insurance does not cover " patient request" scans.  Work: If your pregnancy proceeds without complications you may work until your due date, unless your physician or employer advises otherwise.  Round Ligament Pain/Pelvic Discomfort:  Sharp, shooting pains not associated with bleeding are fairly common, usually occurring in the second trimester of pregnancy.  They tend to be worse when standing up or when you remain standing for long periods of time.  These are the result of pressure of certain pelvic ligaments called "round ligaments".  Rest, Tylenol and heat seem to be the most effective relief.  As the womb and fetus grow, they rise out of the pelvis and the discomfort improves.  Please notify the office if your pain seems different than that described.  It may represent a more serious condition.  Common Medications Safe in Pregnancy  Acne:      Constipation:  Benzoyl Peroxide     Colace  Clindamycin      Dulcolax Suppository  Topica Erythromycin     Fibercon  Salicylic Acid      Metamucil         Miralax AVOID:        Senakot   Accutane    Cough:  Retin-A       Cough Drops  Tetracycline      Phenergan w/ Codeine if Rx  Minocycline      Robitussin (Plain & DM)  Antibiotics:     Crabs/Lice:  Ceclor       RID  Cephalosporins    AVOID:  E-Mycins      Kwell  Keflex  Macrobid/Macrodantin   Diarrhea:  Penicillin      Kao-Pectate  Zithromax      Imodium AD         PUSH FLUIDS AVOID:       Cipro     Fever:  Tetracycline      Tylenol (Regular  or Extra  Minocycline       Strength)  Levaquin      Extra Strength-Do not          Exceed 8 tabs/24 hrs Caffeine:        <273m/day (equiv. To 1 cup of coffee or  approx. 3 12 oz sodas)         Gas: Cold/Hayfever:       Gas-X  Benadryl      Mylicon  Claritin       Phazyme  **Claritin-D        Chlor-Trimeton    Headaches:  Dimetapp      ASA-Free Excedrin  Drixoral-Non-Drowsy     Cold Compress  Mucinex (Guaifenasin)     Tylenol (Regular or Extra  Sudafed/Sudafed-12 Hour     Strength)  **Sudafed PE Pseudoephedrine   Tylenol Cold & Sinus     Vicks Vapor Rub  Zyrtec  **AVOID if Problems With Blood Pressure         Heartburn: Avoid lying down for at least 1 hour after meals  Aciphex      Maalox     Rash:  Milk of Magnesia     Benadryl    Mylanta       1% Hydrocortisone Cream  Pepcid  Pepcid Complete   Sleep Aids:  Prevacid      Ambien   Prilosec       Benadryl  Rolaids       Chamomile Tea  Tums (Limit 4/day)     Unisom  Tylenol PM         Warm milk-add vanilla or  Hemorrhoids:       Sugar for taste  Anusol/Anusol H.C.  (RX: Analapram 2.5%)  Sugar Substitutes:  Hydrocortisone OTC     Ok in moderation  Preparation H      Tucks        Vaseline lotion applied to tissue with wiping    Herpes:     Throat:  Acyclovir      Oragel  Famvir  Valtrex     Vaccines:         Flu Shot Leg Cramps:       *Gardasil  Benadryl      Hepatitis A         Hepatitis B Nasal Spray:       Pneumovax  Saline Nasal Spray     Polio Booster         Tetanus Nausea:       Tuberculosis test or PPD  Vitamin B6 25 mg TID   AVOID:    Dramamine      *Gardasil  Emetrol       Live Poliovirus  Ginger Root 250 mg QID    MMR (measles, mumps &  High Complex Carbs @ Bedtime    rebella)  Sea Bands-Accupressure    Varicella (Chickenpox)  Unisom 1/2 tab TID     *No known complications           If received before Pain:         Known pregnancy;   Darvocet       Resume series  after  Lortab        Delivery  Percocet    Yeast:   Tramadol      Femstat  Tylenol 3      Gyne-lotrimin  Ultram       Monistat  Vicodin           MISC:         All Sunscreens           Hair Coloring/highlights          Insect Repellant's          (Including DEET)         Mystic Tans

## 2022-02-17 NOTE — Progress Notes (Signed)
New OB Intake  I connected with  Jody Mooney on 02/17/22 at  2:15 PM EST by telephone and verified that I am speaking with the correct person using two identifiers. 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  I explained I am completing New OB Intake today. We discussed her EDD of 09/28/2022 that is based on LMP of 12/22/2021. Pt is G4/P2012. I reviewed her allergies, medications, Medical/Surgical/OB history, and appropriate screenings. Based on history, this is a/an pregnancy uncomplicated .   Patient Active Problem List   Diagnosis Date Noted   Supervision of other normal pregnancy, antepartum 10/21/2021   Class 2 obesity due to excess calories without serious comorbidity with body mass index (BMI) of 35.0 to 35.9 in adult 04/17/2019   Chest pain 04/17/2019   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   Vaginal discharge 11/08/2017   Encounter for general adult medical examination with abnormal findings 11/03/2011    Concerns addressed today None  Delivery Plans:  Plans to deliver at [facility].  Anatomy US Explained first scheduled [place] will be 12/22nd and an anatomy scan will be done at 20 weeks.  Labs Discussed genetic screening with patient. Patient desires genetic testing to be drawn at new OB visit. Discussed possible labs to be drawn at new OB appointment.  COVID Vaccine Patient has not had COVID vaccine.   Social Determinants of Health Food Insecurity: denies food insecurity Transportation: Patient denies transportation needs. Childcare: Discussed no children allowed at ultrasound appointments.   First visit review I reviewed new OB appt with pt. I explained she will have ob bloodwork and pap smear/pelvic exam if indicated. Explained pt will be seen by Dr. Clovia Cuff at first visit; encounter routed to appropriate provider.   Cleophas Dunker, [facility] 02/17/2022  2:36 PM

## 2022-02-19 ENCOUNTER — Other Ambulatory Visit: Payer: Self-pay | Admitting: Obstetrics & Gynecology

## 2022-02-19 ENCOUNTER — Ambulatory Visit
Admission: RE | Admit: 2022-02-19 | Discharge: 2022-02-19 | Disposition: A | Discharge status: Home / Self Care | Payer: Medicaid Other | Source: Ambulatory Visit | Attending: Certified Nurse Midwife | Admitting: Certified Nurse Midwife

## 2022-02-19 DIAGNOSIS — Z3A08 8 weeks gestation of pregnancy: Secondary | ICD-10-CM | POA: Diagnosis not present

## 2022-02-19 DIAGNOSIS — Z3480 Encounter for supervision of other normal pregnancy, unspecified trimester: Secondary | ICD-10-CM | POA: Insufficient documentation

## 2022-02-19 DIAGNOSIS — Z369 Encounter for antenatal screening, unspecified: Secondary | ICD-10-CM

## 2022-02-19 DIAGNOSIS — O26891 Other specified pregnancy related conditions, first trimester: Secondary | ICD-10-CM | POA: Diagnosis not present

## 2022-02-19 DIAGNOSIS — O208 Other hemorrhage in early pregnancy: Secondary | ICD-10-CM | POA: Diagnosis not present

## 2022-02-19 DIAGNOSIS — O209 Hemorrhage in early pregnancy, unspecified: Secondary | ICD-10-CM | POA: Insufficient documentation

## 2022-03-01 NOTE — L&D Delivery Note (Signed)
Delivery Note   Jody Mooney is a 27 y.o. [id number] at [redacted]w[redacted]d Estimated Date of Delivery: 09/22/22  PRE-OPERATIVE DIAGNOSIS:  1) [redacted]w[redacted]d pregnancy.  2) Short interval pregnancy 3) Obesity  POST-OPERATIVE DIAGNOSIS:  1) [redacted]w[redacted]d pregnancy s/p Vaginal, Spontaneous  2) Laceration repari  Delivery Type: Vaginal, Spontaneous    Delivery Anesthesia: Local   Labor Complications:  None    ESTIMATED BLOOD LOSS: 420 ml    FINDINGS:   1) female infant, Apgar scores of 9   at 1 minute and 9   at 5 minutes and a birthweight of   ounces.     SPECIMENS:   PLACENTA:   Appearance: Intact    Removal: Spontaneous      Disposition:    CORD BLOOD: Collected/Not Indicated  DISPOSITION:  Infant left in stable condition in the delivery room, with L&D personnel and mother,  NARRATIVE SUMMARY: Labor course:  Jody Mooney is a [id number] at [redacted]w[redacted]d who presented to Labor & Delivery for PROM.  Labor proceeded with augmentation and she was found to be completely dilated at approximately 0745 after an anterior lip was reduced. With excellent maternal pushing effort, she birthed a viable female infant at 32. There was not a nuchal cord. The shoulders were birthed without difficulty. The infant was placed skin-to-skin with mother. The cord was doubly clamped and cut when pulsations ceased. The placenta delivered spontaneously and was noted to be intact with a 3VC. A perineal and vaginal examination was performed. Episiotomy/Lacerations: Periurethral  Lacerations were repaired with Vicryl suture using local anesthesia. The patient tolerated this well. Mother and baby were left in stable condition.   Lindalou Hose Aniello Christopoulos, CNM 09/06/2022 9:28 AM

## 2022-03-05 ENCOUNTER — Encounter: Payer: Medicaid Other | Admitting: Obstetrics & Gynecology

## 2022-03-08 ENCOUNTER — Other Ambulatory Visit: Payer: Self-pay

## 2022-03-08 ENCOUNTER — Other Ambulatory Visit: Payer: Medicaid Other

## 2022-03-08 DIAGNOSIS — Z369 Encounter for antenatal screening, unspecified: Secondary | ICD-10-CM | POA: Diagnosis not present

## 2022-03-08 DIAGNOSIS — Z113 Encounter for screening for infections with a predominantly sexual mode of transmission: Secondary | ICD-10-CM

## 2022-03-08 DIAGNOSIS — Z3481 Encounter for supervision of other normal pregnancy, first trimester: Secondary | ICD-10-CM | POA: Diagnosis not present

## 2022-03-08 DIAGNOSIS — T7589XA Other specified effects of external causes, initial encounter: Secondary | ICD-10-CM | POA: Diagnosis not present

## 2022-03-08 DIAGNOSIS — Z3A10 10 weeks gestation of pregnancy: Secondary | ICD-10-CM

## 2022-03-08 DIAGNOSIS — Z3480 Encounter for supervision of other normal pregnancy, unspecified trimester: Secondary | ICD-10-CM | POA: Diagnosis not present

## 2022-03-08 LAB — OB RESULTS CONSOLE VARICELLA ZOSTER ANTIBODY, IGG: Varicella: NON-IMMUNE/NOT IMMUNE

## 2022-03-08 NOTE — Addendum Note (Signed)
Addended by: Chilton Greathouse on: 03/08/2022 10:48 AM   Modules accepted: Orders

## 2022-03-09 ENCOUNTER — Encounter: Payer: Self-pay | Admitting: Certified Nurse Midwife

## 2022-03-09 LAB — MICROSCOPIC EXAMINATION
Casts: NONE SEEN /lpf
Epithelial Cells (non renal): >10 /hpf — AB (ref 0–10)
RBC, Urine: NONE SEEN /hpf (ref 0–2)

## 2022-03-09 LAB — URINALYSIS, ROUTINE W REFLEX MICROSCOPIC
Bilirubin, UA: NEGATIVE
Glucose, UA: NEGATIVE
Ketones, UA: NEGATIVE
Nitrite, UA: NEGATIVE
Protein,UA: NEGATIVE
RBC, UA: NEGATIVE
Specific Gravity, UA: 1.025 (ref 1.005–1.030)
Urobilinogen, Ur: 0.2 mg/dL (ref 0.2–1.0)
pH, UA: 6.5 (ref 5.0–7.5)

## 2022-03-09 LAB — MONITOR DRUG PROFILE 14(MW)
Amphetamine Scrn, Ur: NEGATIVE ng/mL
BARBITURATE SCREEN URINE: NEGATIVE ng/mL
BENZODIAZEPINE SCREEN, URINE: NEGATIVE ng/mL
Buprenorphine, Urine: NEGATIVE ng/mL
CANNABINOIDS UR QL SCN: NEGATIVE ng/mL
Cocaine (Metab) Scrn, Ur: NEGATIVE ng/mL
Creatinine(Crt), U: 186.1 mg/dL (ref 20.0–300.0)
Fentanyl, Urine: NEGATIVE pg/mL
Meperidine Screen, Urine: NEGATIVE ng/mL
Methadone Screen, Urine: NEGATIVE ng/mL
OXYCODONE+OXYMORPHONE UR QL SCN: NEGATIVE ng/mL
Opiate Scrn, Ur: NEGATIVE ng/mL
Ph of Urine: 5.9 (ref 4.5–8.9)
Phencyclidine Qn, Ur: NEGATIVE ng/mL
Propoxyphene Scrn, Ur: NEGATIVE ng/mL
SPECIFIC GRAVITY: 1.02
Tramadol Screen, Urine: NEGATIVE ng/mL

## 2022-03-09 LAB — NICOTINE SCREEN, URINE: Cotinine Ql Scrn, Ur: NEGATIVE ng/mL

## 2022-03-09 LAB — ABO AND RH: Rh Factor: POSITIVE

## 2022-03-09 LAB — HIV ANTIBODY (ROUTINE TESTING W REFLEX): HIV Screen 4th Generation wRfx: NONREACTIVE

## 2022-03-09 LAB — VIRAL HEPATITIS HBV, HCV
HCV Ab: NONREACTIVE
Hep B Core Total Ab: NEGATIVE
Hep B Surface Ab, Qual: NONREACTIVE
Hepatitis B Surface Ag: NEGATIVE

## 2022-03-09 LAB — PARVOVIRUS B19 ANTIBODY, IGG AND IGM
Parvovirus B19 IgG: 0.5 index (ref 0.0–0.8)
Parvovirus B19 IgM: 0.3 index (ref 0.0–0.8)

## 2022-03-09 LAB — ANTIBODY SCREEN: Antibody Screen: NEGATIVE

## 2022-03-09 LAB — HGB SOLU + RFLX FRAC: Sickle Solubility Test - HGBRFX: NEGATIVE

## 2022-03-09 LAB — RUBELLA SCREEN: Rubella Antibodies, IGG: 4.89 index (ref >0.99)

## 2022-03-09 LAB — TOXOPLASMA ANTIBODIES- IGG AND  IGM
Toxoplasma Antibody- IgM: <3 AU/mL (ref 0.0–7.9)
Toxoplasma IgG Ratio: 3.5 IU/mL (ref 0.0–7.1)

## 2022-03-09 LAB — HCV INTERPRETATION

## 2022-03-09 LAB — VARICELLA ZOSTER ANTIBODY, IGG: Varicella zoster IgG: <135 index — ABNORMAL LOW (ref >165)

## 2022-03-09 LAB — RPR: RPR Ser Ql: NONREACTIVE

## 2022-03-10 ENCOUNTER — Encounter: Payer: Self-pay | Admitting: Obstetrics & Gynecology

## 2022-03-10 DIAGNOSIS — Z2839 Supervision of other high risk pregnancies, unspecified trimester: Secondary | ICD-10-CM | POA: Insufficient documentation

## 2022-03-10 DIAGNOSIS — O09899 Supervision of other high risk pregnancies, unspecified trimester: Secondary | ICD-10-CM | POA: Insufficient documentation

## 2022-03-16 IMAGING — US US OB COMP +14 WK
2 series · 14 of 28 positions shown · non-contrast
Comparison: none

CLINICAL DATA: 25-year-old pregnant female presents for fetal
anatomic survey and fetal dating.

EXAM:
OBSTETRICAL ULTRASOUND >14 WKS

[Series 1: us ob comp + 14 wk · 13 of 95 slices shown (1 of 2)]
[im 4/95]
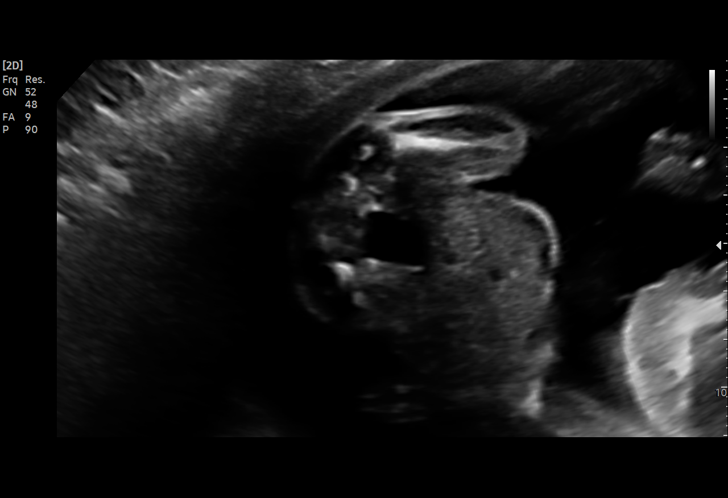
[im 11/95]
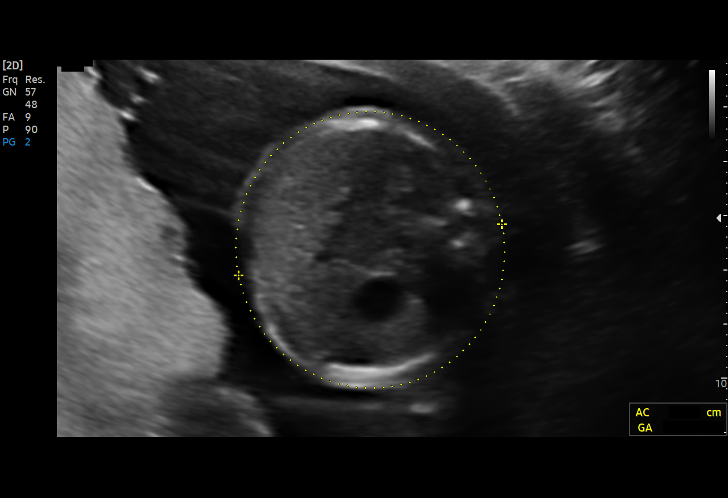
[im 19/95]
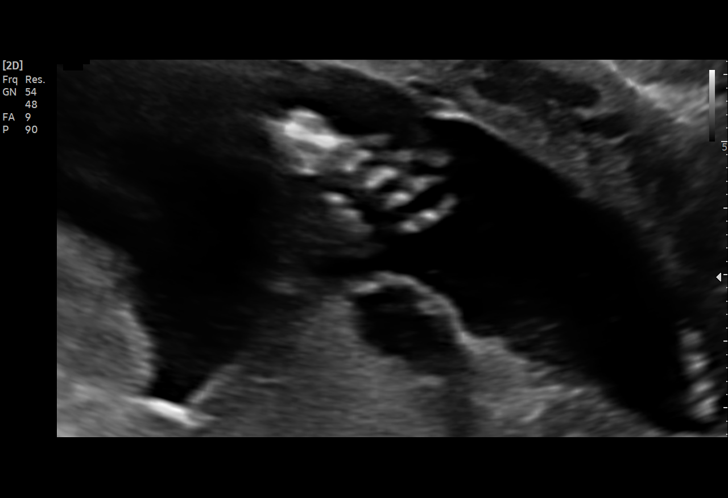
[im 26/95]
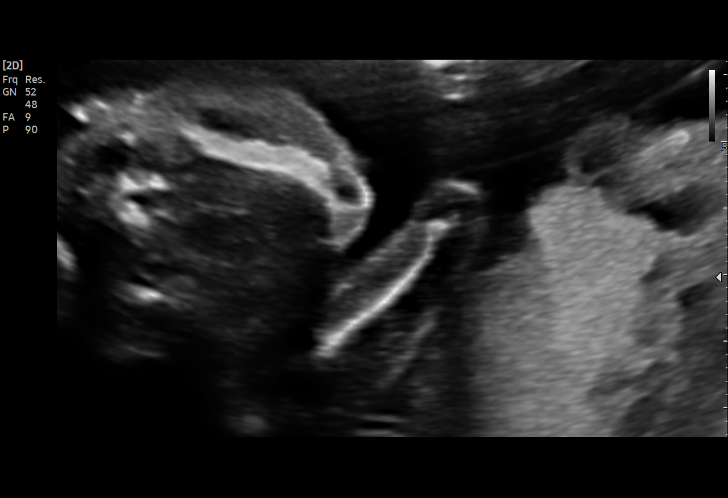
[im 33/95]
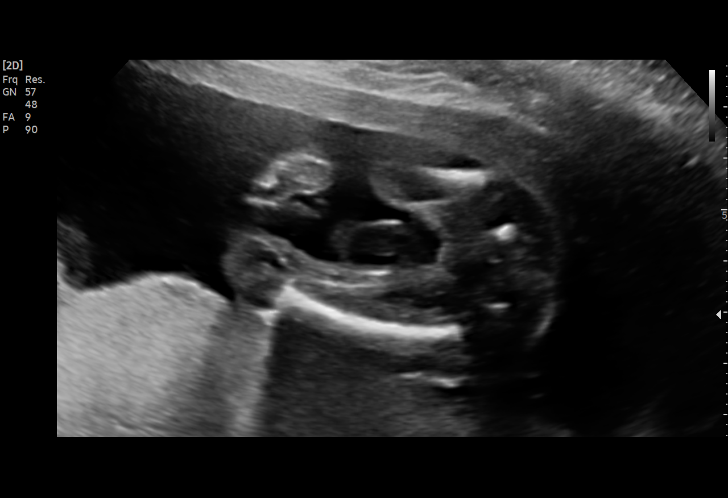
[im 40/95]
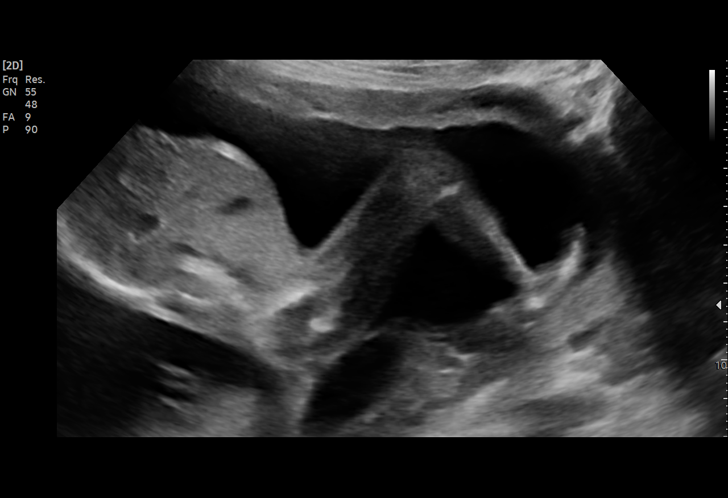
[im 48/95]
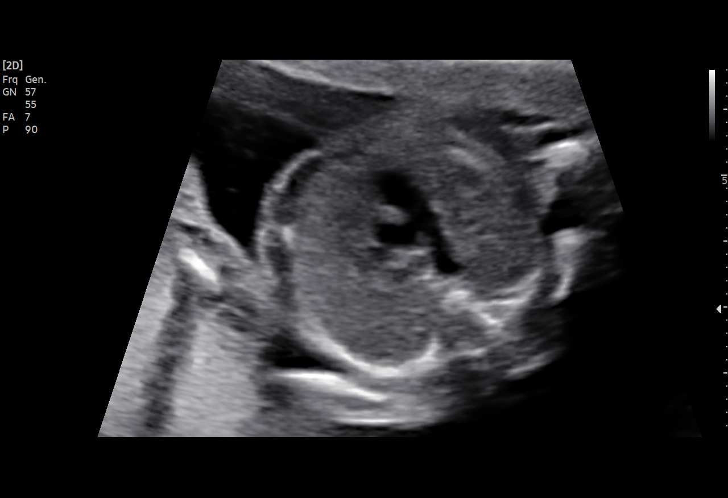
[im 55/95]
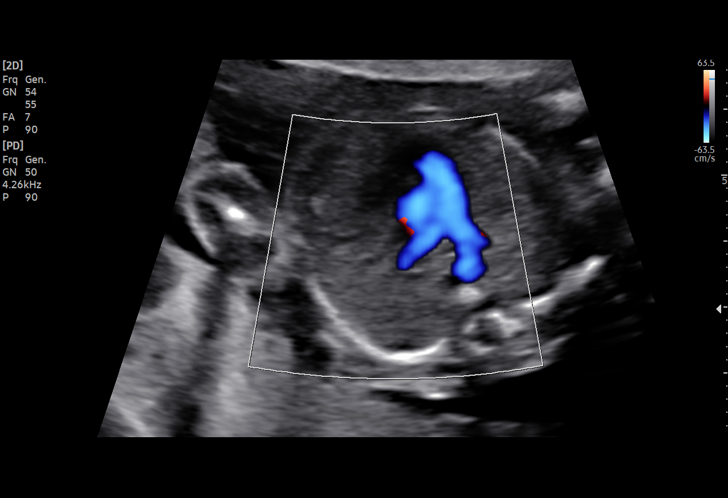
[im 62/95]
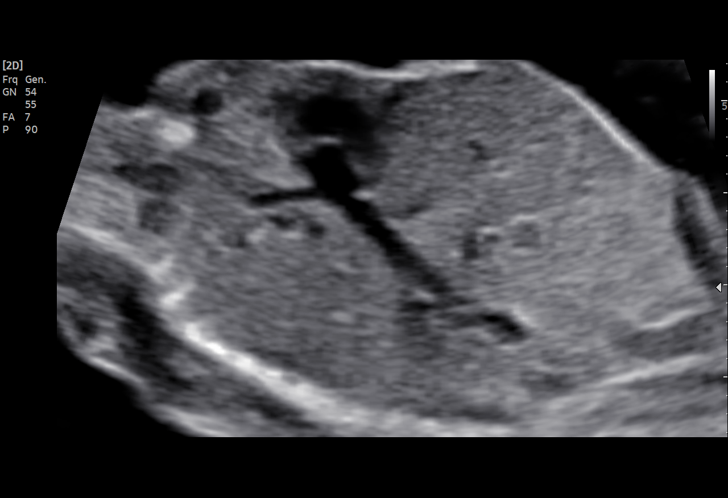
[im 69/95]
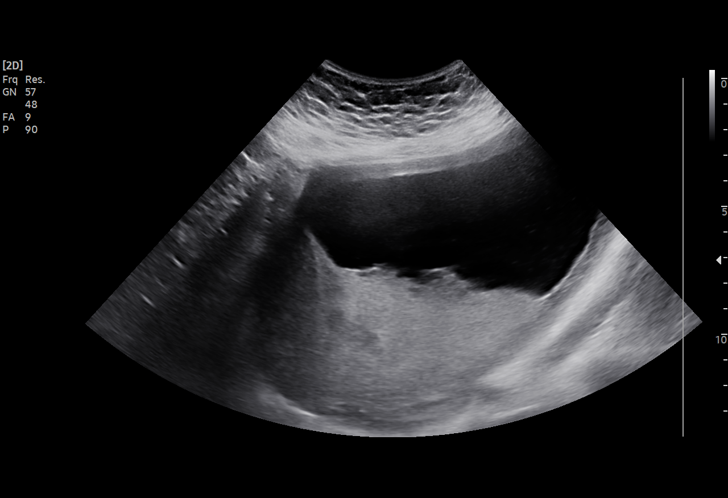
[im 76/95]
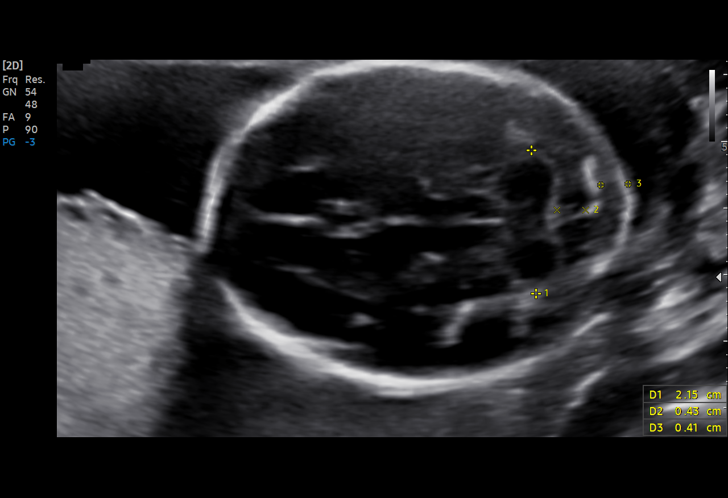
[im 84/95]
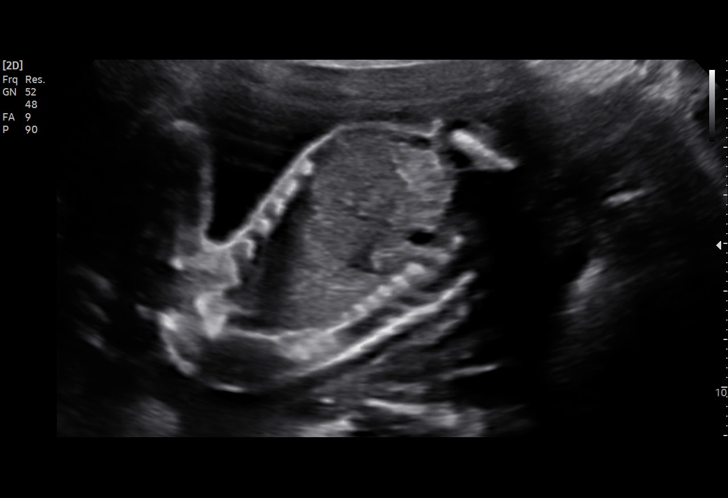
[im 91/95]
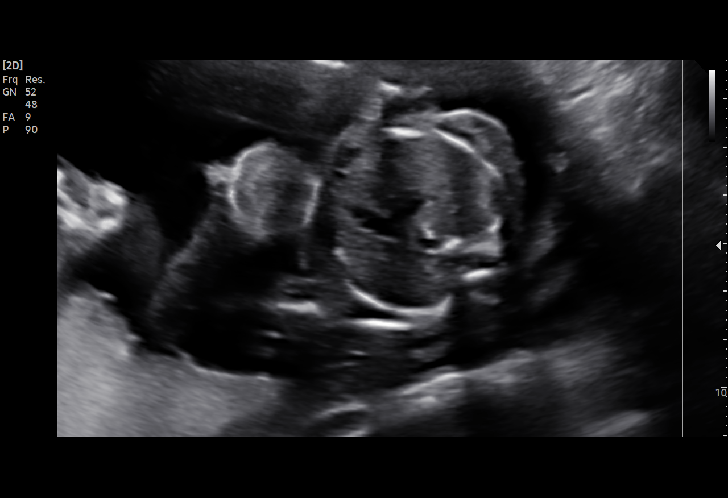

[Series 3: us ob comp + 14 wk · 1 of 1 slices shown (2 of 2)]
[im 1/1]
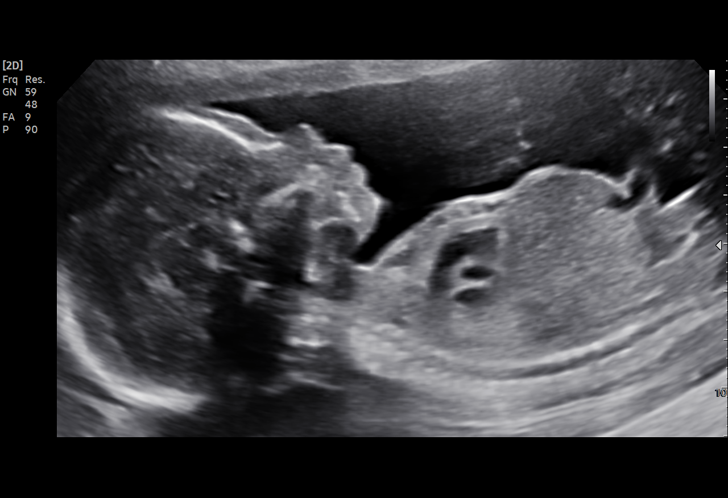

[14 of 28 positions shown; findings below may reference images not displayed]

FINDINGS: Number of Fetuses: 1

Heart Rate:  124 bpm

Movement: Yes

Presentation: Variable

Previa: No

Placental Location: Posterior

Amniotic Fluid (Subjective): Normal

Amniotic Fluid (Objective):

Vertical pocket = 6.0cm

FETAL BIOMETRY

BPD: 4.8cm 20w 4d

HC:   18.4cm 20w 5d

AC:   16.1cm 21w 1d

FL:   3.3cm 20w 2d

Current Mean GA: 20w 4d US EDC: 03/23/2021

Assigned GA:  20w 1d Assigned EDC: 03/26/2021

Estimated Fetal Weight:  377g

FETAL ANATOMY

Lateral Ventricles: Appears normal

Thalami/CSP: Appears normal

Posterior Fossa:  Appears normal

Nuchal Region: Appears normal   NFT= 4.1 mm

Upper Lip: Appears normal

Spine: Not well visualized due to fetal position

4 Chamber Heart on Left: Solitary echogenic intracardiac focus in
the fetal left ventricle. Otherwise appears normal

LVOT: Appears normal

RVOT: Appears normal

Stomach on Left: Appears normal

3 Vessel Cord: Appears normal

Cord Insertion site: Appears normal

Kidneys: Appears normal

Bladder: Appears normal

Extremities: Appears normal, 4 extremities demonstrated

Technically difficult due to: Fetal position

Maternal Findings:

Cervix: Cervix length approximately 4.1 cm on transabdominal views,
with no evidence of internal cervical funneling.
IMPRESSION: 1. Single living intrauterine gestation in variable lie at 20 weeks
4 days gestational age by average ultrasound age, concordant with
assigned dating.
2. Incomplete fetal anatomic survey with attention to the fetal
spine due to fetal position. Suggest attention on follow-up
obstetric scan in 4 weeks.
3. Solitary echogenic intracardiac focus, of limited prognostic
value for aneuploidy as an isolated finding. Otherwise no fetal or
maternal abnormalities detected. Suggest correlation with maternal
risk factors and maternal serum screening if performed.

## 2022-03-17 ENCOUNTER — Ambulatory Visit (INDEPENDENT_AMBULATORY_CARE_PROVIDER_SITE_OTHER): Payer: Medicaid Other | Admitting: Obstetrics & Gynecology

## 2022-03-17 VITALS — BP 101/53 | HR 71 | Wt 217.0 lb

## 2022-03-17 DIAGNOSIS — O99210 Obesity complicating pregnancy, unspecified trimester: Secondary | ICD-10-CM

## 2022-03-17 DIAGNOSIS — Z3491 Encounter for supervision of normal pregnancy, unspecified, first trimester: Secondary | ICD-10-CM | POA: Diagnosis not present

## 2022-03-17 DIAGNOSIS — Z3A12 12 weeks gestation of pregnancy: Secondary | ICD-10-CM

## 2022-03-17 DIAGNOSIS — Z3481 Encounter for supervision of other normal pregnancy, first trimester: Secondary | ICD-10-CM

## 2022-03-17 DIAGNOSIS — Z3480 Encounter for supervision of other normal pregnancy, unspecified trimester: Secondary | ICD-10-CM

## 2022-03-17 LAB — POCT URINALYSIS DIPSTICK OB
Bilirubin, UA: NEGATIVE
Blood, UA: NEGATIVE
Glucose, UA: NEGATIVE
Ketones, UA: NEGATIVE
Nitrite, UA: NEGATIVE
Urobilinogen, UA: 0.2 E.U./dL
pH, UA: 7.5 (ref 5.0–8.0)

## 2022-03-17 NOTE — Progress Notes (Signed)
Subjective:    Jody Mooney is a [id number]  at 8w1dbeing seen today for her first obstetrical visit.  Her obstetrical history is significant for obesity. Patient does intend to breast feed. Pregnancy history fully reviewed.  Patient reports no complaints. She feels mild fetal movements.  Vitals:   03/17/22 1407  BP: (!) 101/53  Pulse: 71  Weight: 217 lb (98.4 kg)    HISTORY:  Last pap 2022 and normal.  OB History  Gravida Para Term Preterm AB Living  '4 2 2   1 2  '$ SAB IAB Ectopic Multiple Live Births  1     0 2    # Outcome Date GA Lbr Len/2nd Weight Sex Delivery Anes PTL Lv  4 Current           3 SAB 10/2021     SAB     2 Term 03/20/21 354w1d 00:17 7 lb 3.7 oz (3.28 kg) F Vag-Spont None  LIV  1 Term 03/04/18 3931w0d:26 / 00:12 6 lb 9.6 oz (2.994 kg) F Vag-Spont None  LIV   Past Medical History:  Diagnosis Date   Asthma    Past Surgical History:  Procedure Laterality Date   CYSTOSCOPY N/A 05/15/2019   Procedure: CYSTOSCOPY;  Surgeon: SchHomero FellersD;  Location: [facility] ORS;  Service: Gynecology;  Laterality: N/A;   LAPAROSCOPY  05/15/2019   Procedure: LAPAROSCOPY DIAGNOSTIC WITH RIGHT OOPHORECTOMY AND LEFT OVARIAN CYSTECTOMY;  Surgeon: SchHomero FellersD;  Location: [facility] ORS;  Service: Gynecology;;   WISDOM TOOTH EXTRACTION     Family History  Problem Relation Age of Onset   Anxiety disorder Mother    Hyperlipidemia Mother    Healthy Father    Healthy Sister    Healthy Brother    Healthy Brother    Anxiety disorder Maternal Grandmother    Diabetes Maternal Grandmother    Diabetes Maternal Grandfather    Anxiety disorder Other        Great Grandmother   Depression Other        Grandmother and mother   Hyperlipidemia Other    Diabetes type II Other        Grand parents   Other Other        VSD repaired in grandmother age 77 37  Exam   Audible fetal movements heard with the Doppler  Uterus:     Pelvic Exam:    Perineum: Normal  Perineum   Vulva: normal   Vagina:  normal mucosa   pH:    Cervix: deferred   Adnexa:    Bony Pelvis:   System: Breast:     Skin: normal coloration and turgor, no rashes    Neurologic: oriented   Extremities: normal strength, tone, and muscle mass   HEENT PERRLA   Mouth/Teeth mucous membranes moist, pharynx normal without lesions   Neck supple   Cardiovascular: regular rate and rhythm   Respiratory:  appears well, vitals normal, no respiratory distress, acyanotic, normal RR, ear and throat exam is normal, neck free of mass or lymphadenopathy, chest clear, no wheezing, crepitations, rhonchi, normal symmetric air entry   Abdomen: normal findings: bowel sounds normal   Urinary: urethral meatus normal      Assessment:    Pregnancy: [id number] Active Problem List   Diagnosis Date Noted   Susceptible to varicella (non-immune), currently pregnant 03/10/2022   Supervision of other normal pregnancy, antepartum 10/21/2021   Class 2 obesity due to excess  calories without serious comorbidity with body mass index (BMI) of 35.0 to 35.9 in adult 04/17/2019   Chest pain 04/17/2019   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   Vaginal discharge 11/08/2017   Encounter for general adult medical examination with abnormal findings 11/03/2011        Plan:     Initial labs drawn. Prenatal vitamins. Problem list reviewed and updated. Genetic Screening discussed and declineddeclined.  Ultrasound discussed; fetal survey: ordered. Rec 20# or less total weight gain HBA1C  Follow up in 4 weeks.    Emily Filbert 03/17/2022

## 2022-03-18 LAB — HEMOGLOBIN A1C
Est. average glucose Bld gHb Est-mCnc: 103 mg/dL
Hgb A1c MFr Bld: 5.2 % (ref 4.8–5.6)

## 2022-03-19 LAB — CULTURE, OB URINE

## 2022-03-19 LAB — URINE CULTURE, OB REFLEX

## 2022-04-13 IMAGING — US US OB FOLLOW-UP
1 series · 15 of 28 positions shown · non-contrast
Comparison: none

CLINICAL DATA: Pregnancy.  Follow-up anatomy.

EXAM:
OBSTETRIC 14+ WK ULTRASOUND FOLLOW-UP

[Series 1: us ob follow up · 15 of 52 slices shown]
[im 1/52]
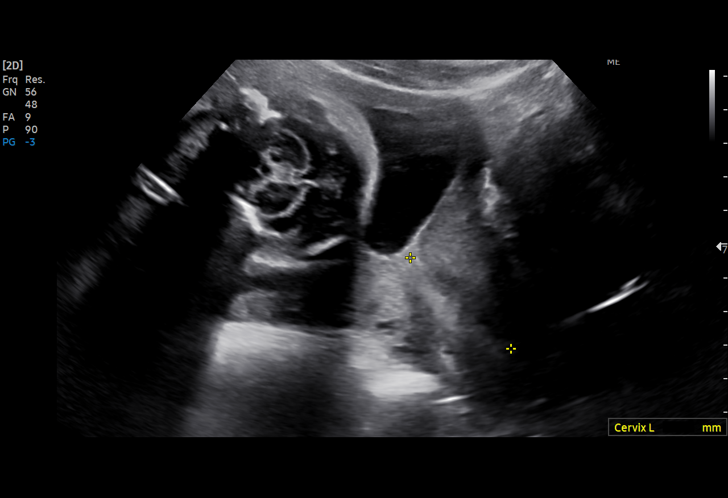
[im 4/52]
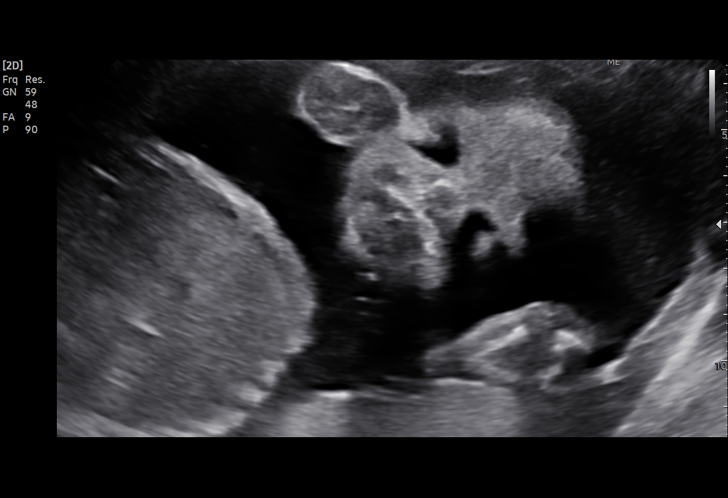
[im 8/52]
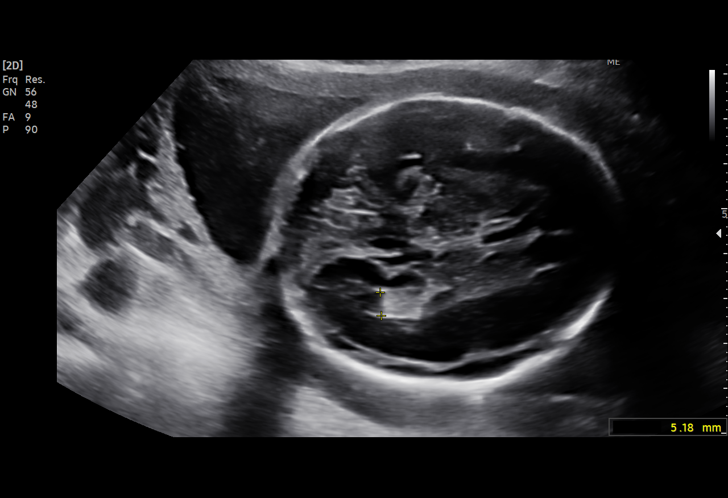
[im 12/52]
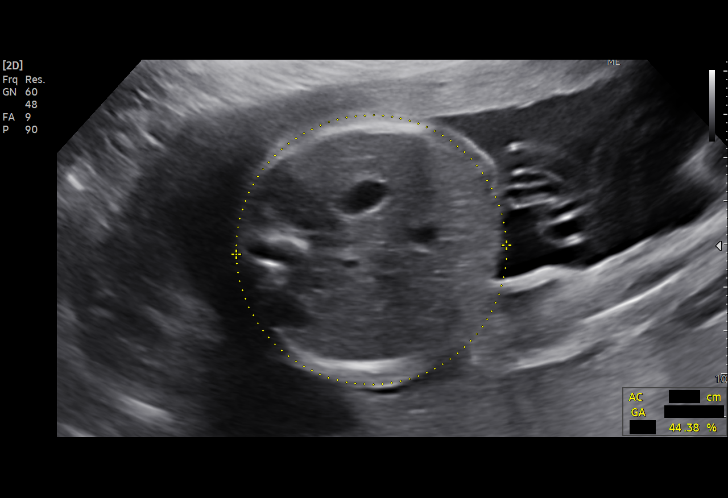
[im 16/52]
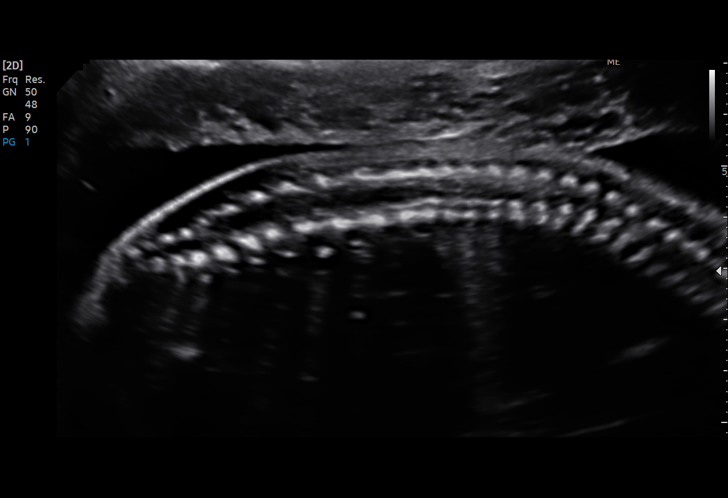
[im 19/52]
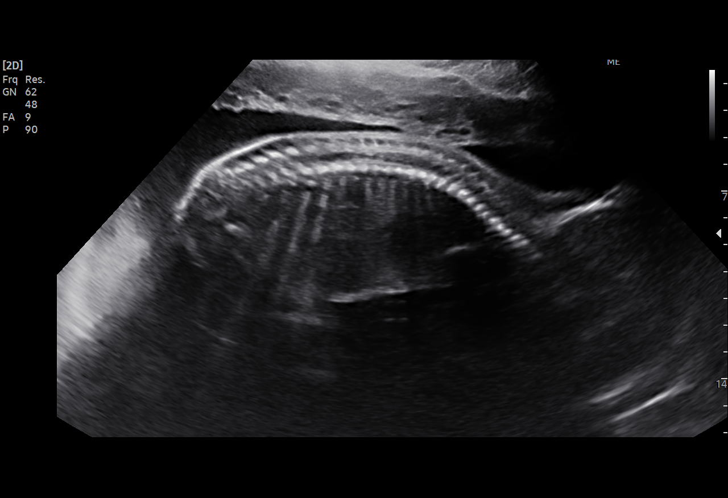
[im 23/52]
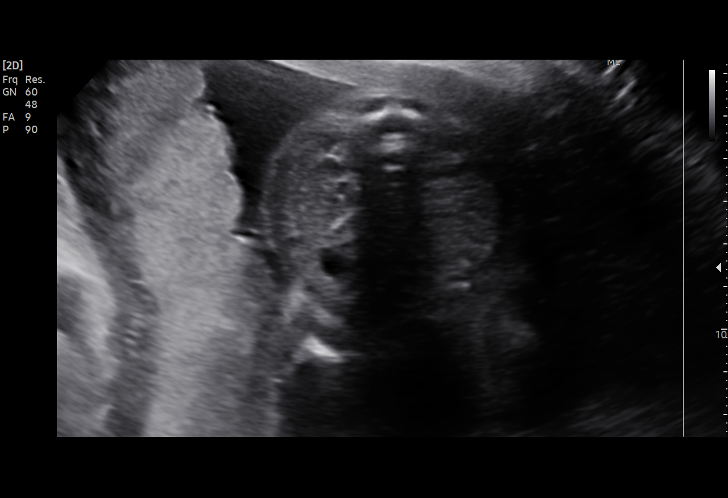
[im 27/52]
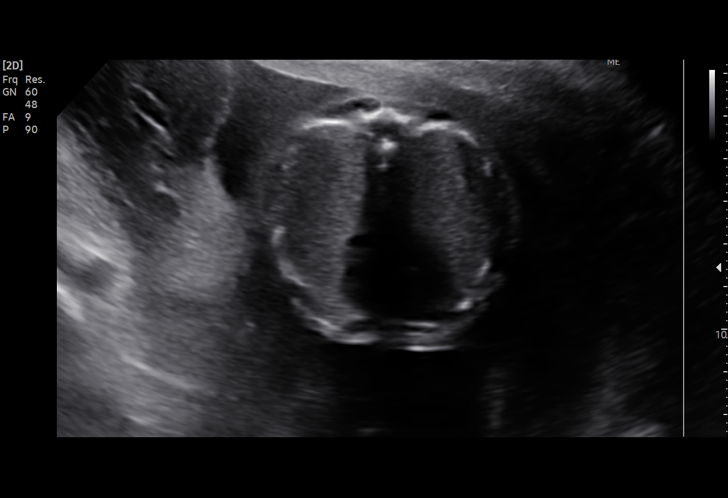
[im 29/52]
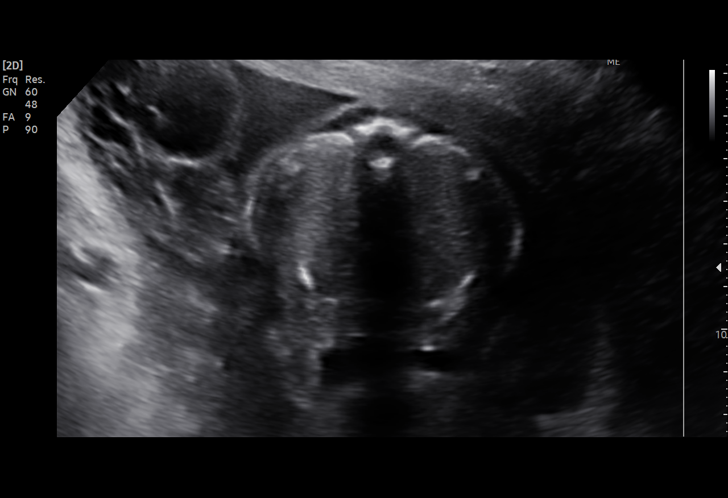
[im 33/52]
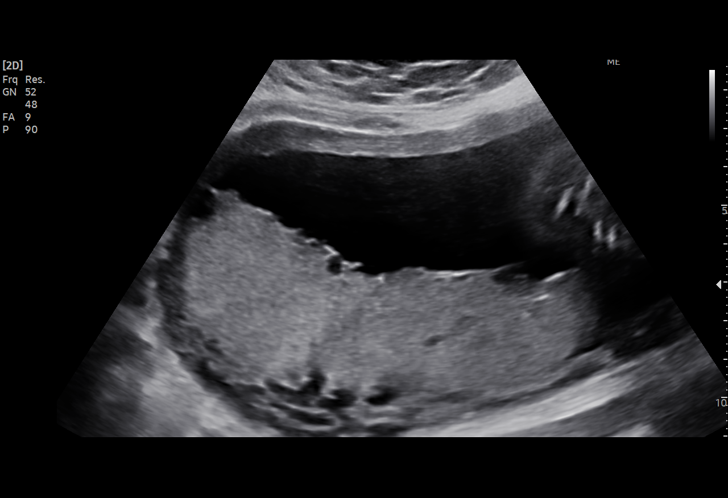
[im 36/52]
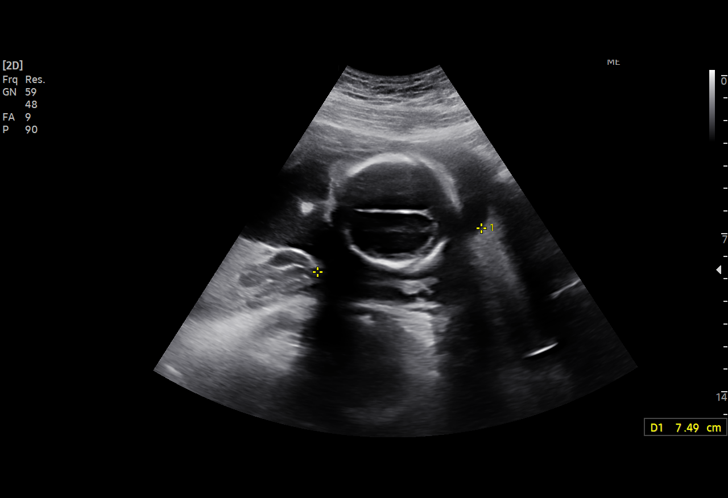
[im 40/52]
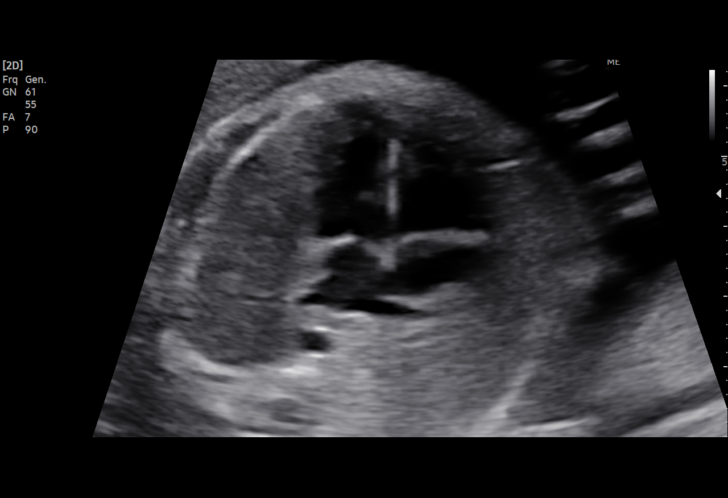
[im 44/52]
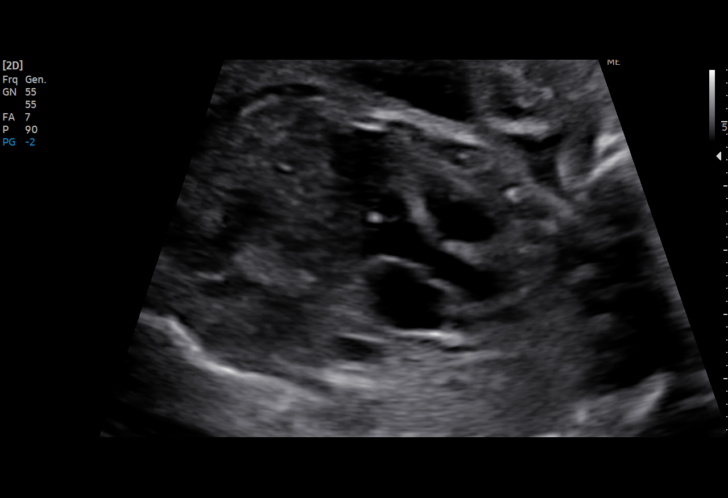
[im 48/52]
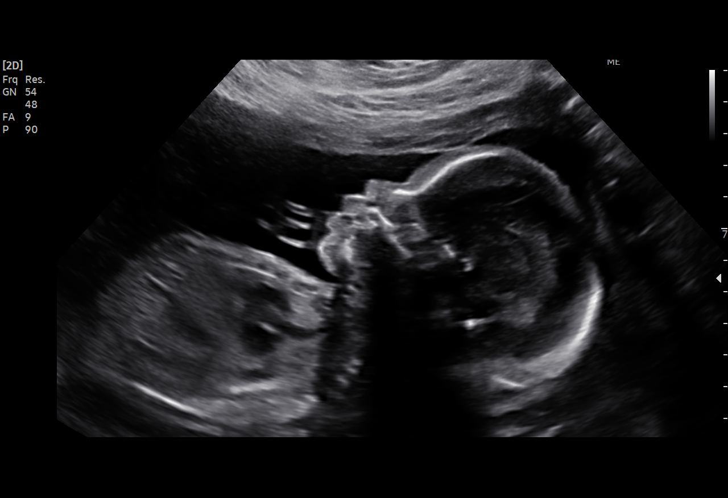
[im 52/52]
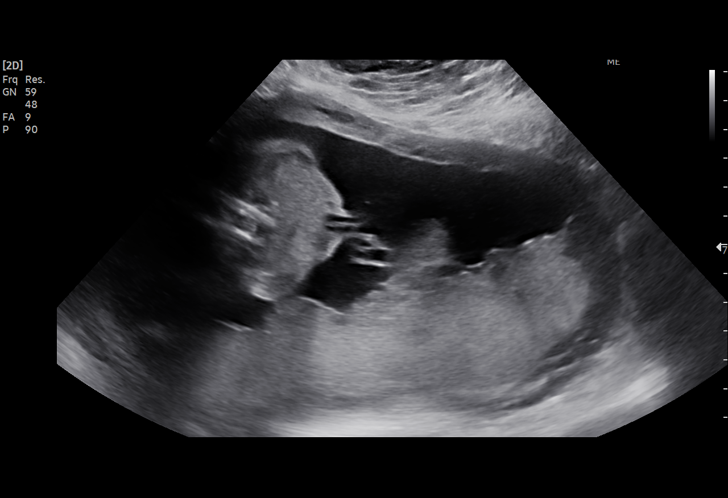

[15 of 28 positions shown; findings below may reference images not displayed]

FINDINGS: Number of Fetuses: 1

Heart Rate:  144 bpm

Movement: Yes

Presentation: Cephalic

Previa: No

Placental Location: Posterior

Amniotic Fluid (Subjective): Normal

Amniotic Fluid (Objective):

Vertical pocket 5.5cm

FETAL BIOMETRY

BPD:  6.3cm 25w 3d

HC:    22.9cm 24w 6d

AC:    19.3cm 24w 0d

FL:    4.5cm 24w 6d

Current Mean GA: 24w 5d US EDC: 03/22/2021

Assigned GA: 24w 1d Assigned EDC: 03/26/2021

Estimated Fetal Weight:  700g 55%ile

FETAL ANATOMY

Lateral Ventricles: Previously seen

Thalami/CSP: Previously seen

Posterior Fossa: Previously seen

Nuchal Region: Previously seen

Upper Lip: Previously seen

Spine: Appears normal

4 Chamber Heart on Left: Previously seen. Redemonstrated solitary
echogenic intracardiac focus in the fetal left ventricle, unchanged.

LVOT: Previously seen

RVOT: Previously seen

Stomach on Left: Previously seen

3 Vessel Cord: Previously seen

Cord Insertion site: Previously seen

Kidneys: Previously seen

Bladder: Previously seen

Extremities: Previously seen

Sex: Previously Seen

Technical Limitations: None.

Maternal Findings:

Cervix:  Closed.  4.0 cm.
IMPRESSION: 1. Single live intrauterine gestation in cephalic presentation.
2. Assigned gestational age of 24 weeks 1 day. Adequate interval
growth.
3. Adequate visualization of the fetal spine. Anatomic survey is now
complete.
4. Unchanged solitary echogenic intracardiac focus, will likely
incidental in isolation in the absence of known maternal risk
factors.

## 2022-04-14 ENCOUNTER — Encounter: Payer: Self-pay | Admitting: Licensed Practical Nurse

## 2022-04-14 ENCOUNTER — Ambulatory Visit (INDEPENDENT_AMBULATORY_CARE_PROVIDER_SITE_OTHER): Payer: Medicaid Other | Admitting: Licensed Practical Nurse

## 2022-04-14 VITALS — BP 110/68 | HR 73 | Wt 216.0 lb

## 2022-04-14 DIAGNOSIS — Z3482 Encounter for supervision of other normal pregnancy, second trimester: Secondary | ICD-10-CM

## 2022-04-14 DIAGNOSIS — Z3A16 16 weeks gestation of pregnancy: Secondary | ICD-10-CM | POA: Diagnosis not present

## 2022-04-14 DIAGNOSIS — Z3480 Encounter for supervision of other normal pregnancy, unspecified trimester: Secondary | ICD-10-CM | POA: Diagnosis not present

## 2022-04-14 DIAGNOSIS — Z6838 Body mass index (BMI) 38.0-38.9, adult: Secondary | ICD-10-CM

## 2022-04-14 LAB — POCT URINALYSIS DIPSTICK
Bilirubin, UA: NEGATIVE
Blood, UA: NEGATIVE
Glucose, UA: NEGATIVE
Ketones, UA: NEGATIVE
Leukocytes, UA: NEGATIVE
Nitrite, UA: NEGATIVE
Protein, UA: NEGATIVE
Spec Grav, UA: 1.015 (ref 1.010–1.025)
Urobilinogen, UA: 1 E.U./dL
pH, UA: 6 (ref 5.0–8.0)

## 2022-04-14 MED ORDER — ASPIRIN 81 MG PO TBEC: 81.0000 mg | DELAYED_RELEASE_TABLET | Freq: Every day | ORAL | 12 refills | Status: DC

## 2022-04-14 NOTE — Progress Notes (Signed)
Routine Prenatal Care Visit  Subjective  Jody Mooney is a 27 y.o. [phone] at 64w1dbeing seen today for ongoing prenatal care.  She is currently monitored for the following issues for this low-risk pregnancy and has Encounter for general adult medical examination with abnormal findings; Vaginal discharge; Obesity in pregnancy; Chest pain; Stressful job; Supervision of other normal pregnancy, antepartum; and Susceptible to varicella (non-immune), currently pregnant on their problem list.  ----------------------------------------------------------------------------------- Patient reports no complaints.  Doing well. Mood has been good. Under a little stress d/t long work hours and not a lot of family time, considering looking for a new job. Has a 452and 26year old at home. Would like  a total of 4 children but plans have some time between this baby and the next.  -has anatomy [place]  Indications for ASA therapy (per uptodate) One of the following: Previous pregnancy with preeclampsia, especially early onset and with an adverse outcome No Multifetal gestation No Chronic hypertension No Type 1 or 2 diabetes mellitus No Chronic kidney disease No Autoimmune disease (antiphospholipid syndrome, systemic lupus erythematosus) No  Two or more of the following: Nulliparity No Obesity (body mass index >30 kg/m2) Yes Family history of preeclampsia in mother or sister No Age ?35 years No Sociodemographic characteristics (African American race, low socioeconomic level) Yes Personal risk factors (eg, previous pregnancy with low birth weight or small for gestational age infant, previous adverse pregnancy outcome [eg, stillbirth], interval >10 years between pregnancies) No   Contractions: Not present. Vag. Bleeding: None.  Movement: Present. Leaking Fluid denies.  ----------------------------------------------------------------------------------- The following portions of the patient's history were  reviewed and updated as appropriate: allergies, current medications, past family history, past medical history, past social history, past surgical history and problem list. Problem list updated.  Objective  Blood pressure 110/68, pulse 73, weight 216 lb (98 kg), last menstrual period 12/22/2021, not currently breastfeeding. Pregravid weight 209 lb (94.8 kg) Total Weight Gain 7 lb (3.175 kg) Urinalysis: Urine Protein    Urine Glucose    Fetal Status: Fetal Heart Rate (bpm): 140   Movement: Present     General:  Alert, oriented and cooperative. Patient is in no acute distress.  Skin: Skin is warm and dry. No rash noted.   Cardiovascular: Normal heart rate noted  Respiratory: Normal respiratory effort, no problems with respiration noted  Abdomen: Soft, gravid, appropriate for gestational age. Pain/Pressure: Absent     Pelvic:  Cervical exam deferred        Extremities: Normal range of motion.  Edema: None  Mental Status: Normal mood and affect. Normal behavior. Normal judgment and thought content.   Assessment   27y.o. [id number] [id number]  09/28/2022, by Last Menstrual Period presenting for routine prenatal visit  Plan   fourth Problems (from 02/17/22 to present)     No problems associated with this episode.        general obstetric precautions including but not limited to vaginal bleeding, contractions, leaking of fluid and fetal movement were reviewed in detail with the patient. Please refer to After Visit Summary for other counseling recommendations.   Return in about 4 weeks (around 05/12/2022) for ROB.   Will start ASA daily   LyRoberto [email or web address]  04/14/22  11:57 AM

## 2022-04-16 LAB — URINE CULTURE

## 2022-05-07 ENCOUNTER — Ambulatory Visit
Admission: RE | Admit: 2022-05-07 | Discharge: 2022-05-07 | Disposition: A | Discharge status: Home / Self Care | Payer: Medicaid Other | Source: Ambulatory Visit | Attending: Obstetrics & Gynecology | Admitting: Obstetrics & Gynecology

## 2022-05-07 DIAGNOSIS — Z3A12 12 weeks gestation of pregnancy: Secondary | ICD-10-CM | POA: Insufficient documentation

## 2022-05-07 DIAGNOSIS — Z3480 Encounter for supervision of other normal pregnancy, unspecified trimester: Secondary | ICD-10-CM | POA: Diagnosis present

## 2022-05-07 DIAGNOSIS — Z3491 Encounter for supervision of normal pregnancy, unspecified, first trimester: Secondary | ICD-10-CM | POA: Insufficient documentation

## 2022-05-07 DIAGNOSIS — Z3689 Encounter for other specified antenatal screening: Secondary | ICD-10-CM | POA: Diagnosis not present

## 2022-05-07 DIAGNOSIS — Z3A20 20 weeks gestation of pregnancy: Secondary | ICD-10-CM | POA: Insufficient documentation

## 2022-05-12 ENCOUNTER — Ambulatory Visit (INDEPENDENT_AMBULATORY_CARE_PROVIDER_SITE_OTHER): Payer: Medicaid Other | Admitting: Obstetrics and Gynecology

## 2022-05-12 VITALS — BP 123/74 | HR 74 | Wt 219.8 lb

## 2022-05-12 DIAGNOSIS — Z3A20 20 weeks gestation of pregnancy: Secondary | ICD-10-CM

## 2022-05-12 DIAGNOSIS — O09899 Supervision of other high risk pregnancies, unspecified trimester: Secondary | ICD-10-CM | POA: Insufficient documentation

## 2022-05-12 DIAGNOSIS — Z3482 Encounter for supervision of other normal pregnancy, second trimester: Secondary | ICD-10-CM | POA: Diagnosis not present

## 2022-05-12 DIAGNOSIS — Z3480 Encounter for supervision of other normal pregnancy, unspecified trimester: Secondary | ICD-10-CM | POA: Diagnosis not present

## 2022-05-12 DIAGNOSIS — Z1379 Encounter for other screening for genetic and chromosomal anomalies: Secondary | ICD-10-CM | POA: Diagnosis not present

## 2022-05-12 LAB — POCT URINALYSIS DIPSTICK OB
Bilirubin, UA: NEGATIVE
Glucose, UA: NEGATIVE
Ketones, UA: NEGATIVE
Nitrite, UA: NEGATIVE
Spec Grav, UA: 1.015 (ref 1.010–1.025)
Urobilinogen, UA: 0.2 E.U./dL
pH, UA: 7.5 (ref 5.0–8.0)

## 2022-05-12 NOTE — Progress Notes (Signed)
ROB: Patient is a 27 y.o. [id number] at 75w1dwho presents for routine OB care. Discussed contraception, patient unsure, counseling provided, may consider copper IUD. Discussed genetic screening, patient now desires to perform.  Will order Panorama, AFP. Discussed carrier screening. Unsure if she had this performed in any of her other pregnancies.  After discussion of risks/benefits, patient ok to screen. Horizon testing ordered.

## 2022-05-12 NOTE — Progress Notes (Signed)
ROB [redacted]w[redacted]d She is doing well. She reports good fetal movement. She declines AFP testing today. She does want to do genetic testing today. She has not been taking any prenatal vitamins. I recommended that she start taking [facility] [facility].

## 2022-05-14 LAB — AFP, SERUM, OPEN SPINA BIFIDA
AFP MoM: 1.84
AFP Value: 84.7 ng/mL
Gest. Age on Collection Date: 20.1 weeks
Maternal Age At EDD: 26.4 yr
OSBR Risk 1 IN: 1162
Test Results:: NEGATIVE
Weight: 220 [lb_av]

## 2022-05-18 LAB — PANORAMA PRENATAL TEST FULL PANEL:PANORAMA TEST PLUS 5 ADDITIONAL MICRODELETIONS: FETAL FRACTION: 7.9

## 2022-05-20 LAB — HORIZON 14 (PAN-ETHNIC STANDARD)

## 2022-05-21 ENCOUNTER — Encounter: Payer: Self-pay | Admitting: Obstetrics and Gynecology

## 2022-05-23 IMAGING — US US OB FOLLOW-UP
1 series · 15 of 28 positions shown · non-contrast
Comparison: none

CLINICAL DATA: Third trimester pregnancy. Follow-up fetal growth
and amniotic fluid.

EXAM:
OBSTETRIC 14+ WK ULTRASOUND FOLLOW-UP

[Series 1: us ob follow up · 15 of 82 slices shown]
[im 1/82]
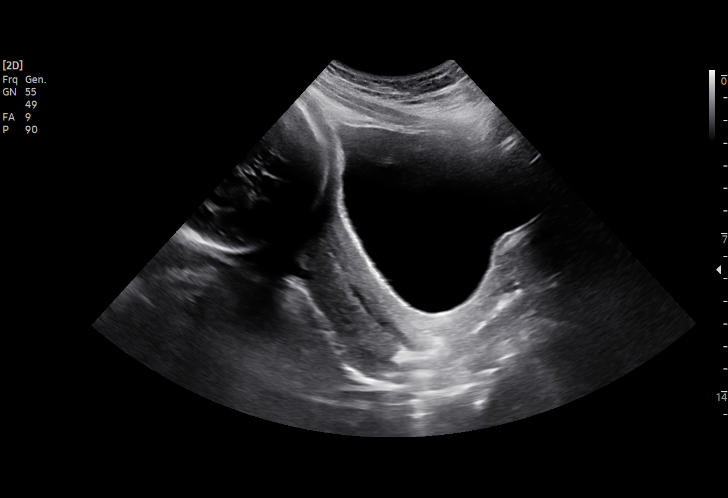
[im 7/82]
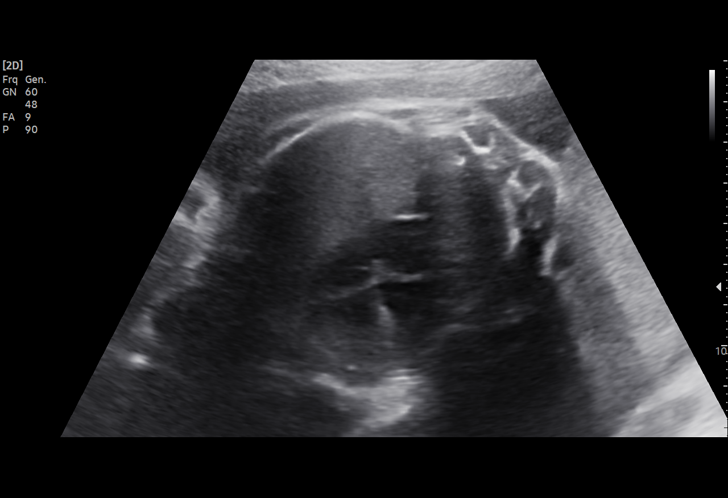
[im 13/82]
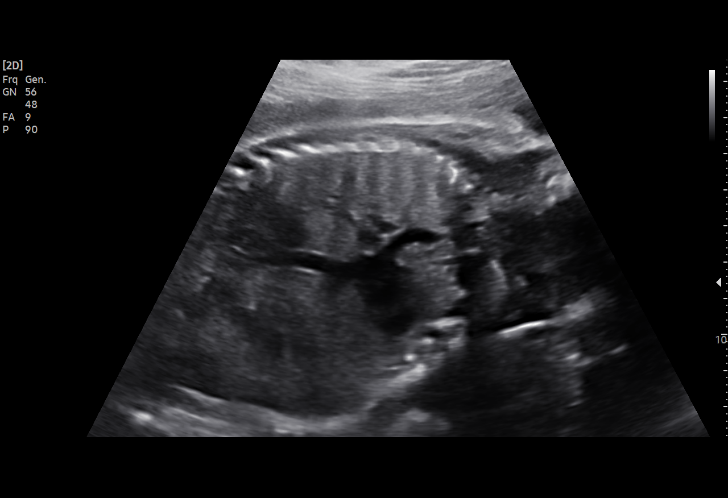
[im 19/82]
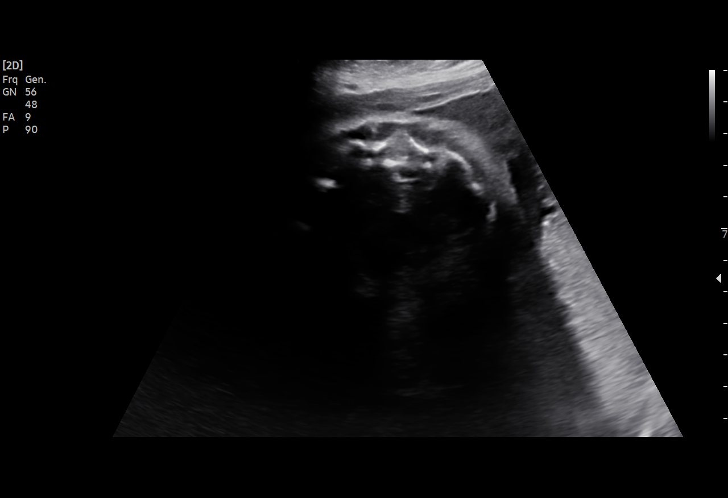
[im 25/82]
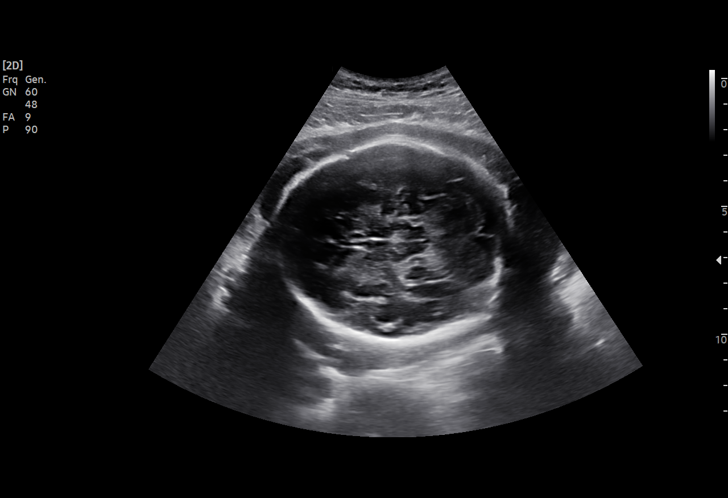
[im 31/82]
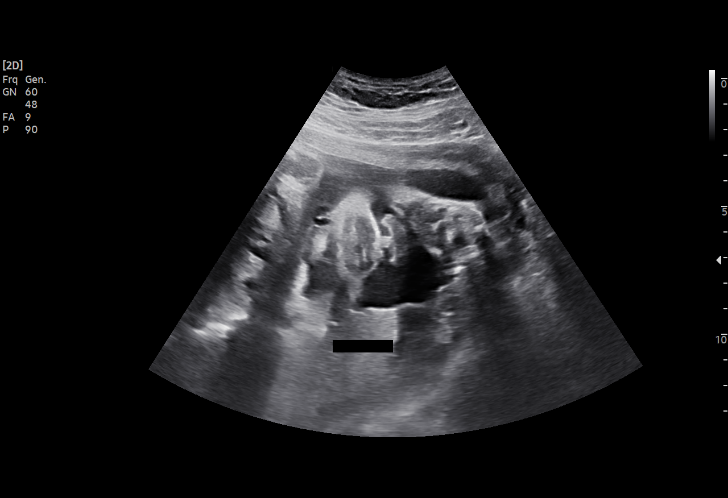
[im 37/82]
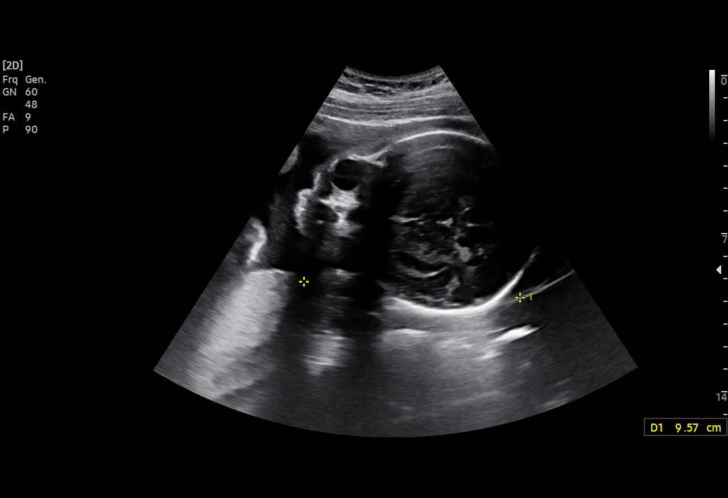
[im 43/82]
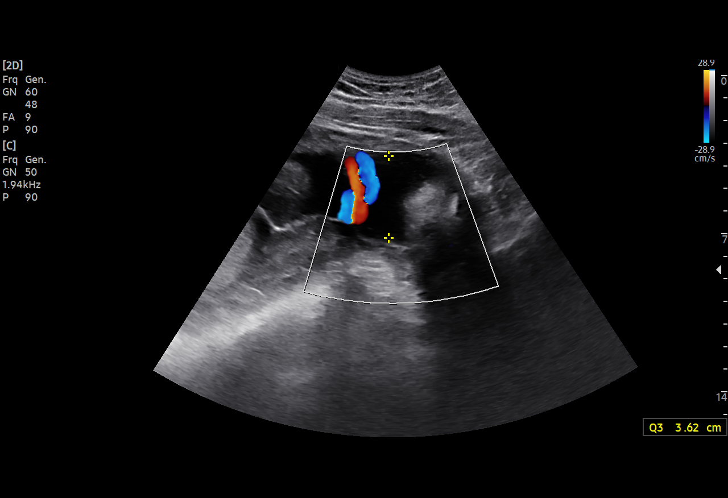
[im 46/82]
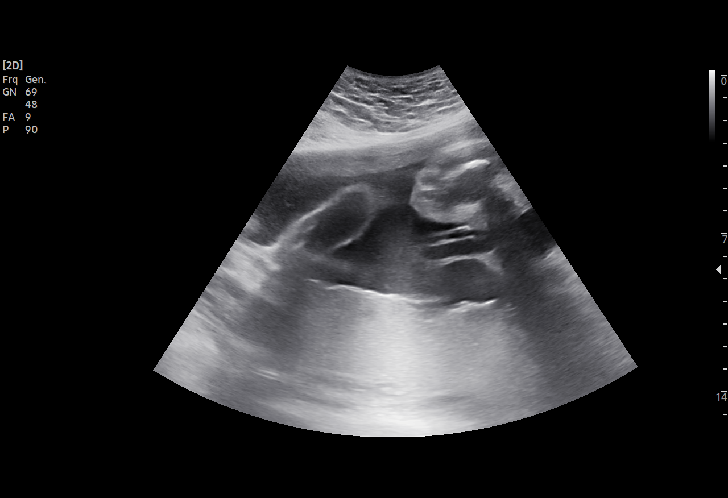
[im 52/82]
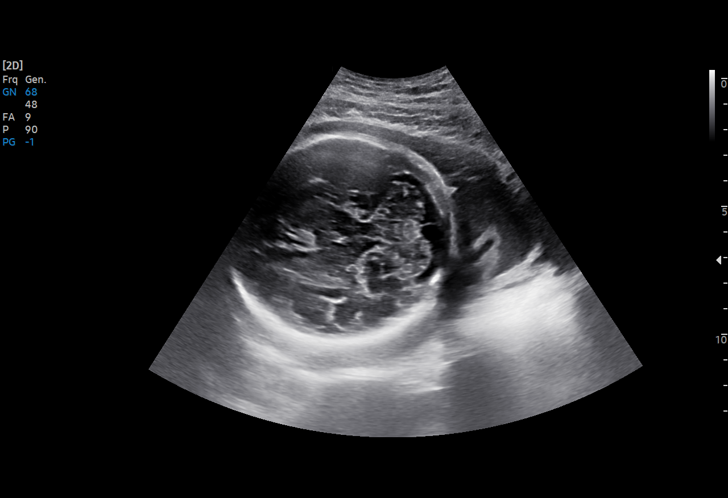
[im 58/82]
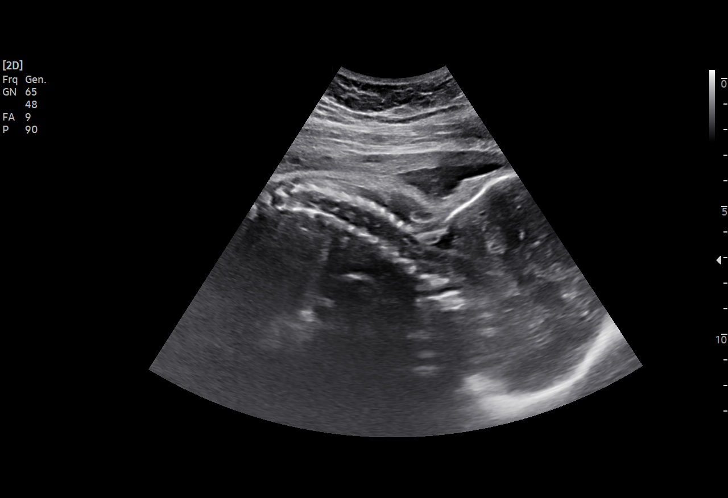
[im 64/82]
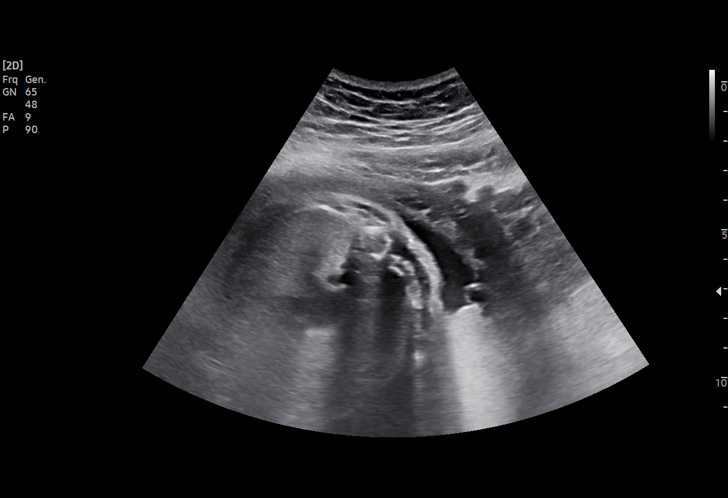
[im 70/82]
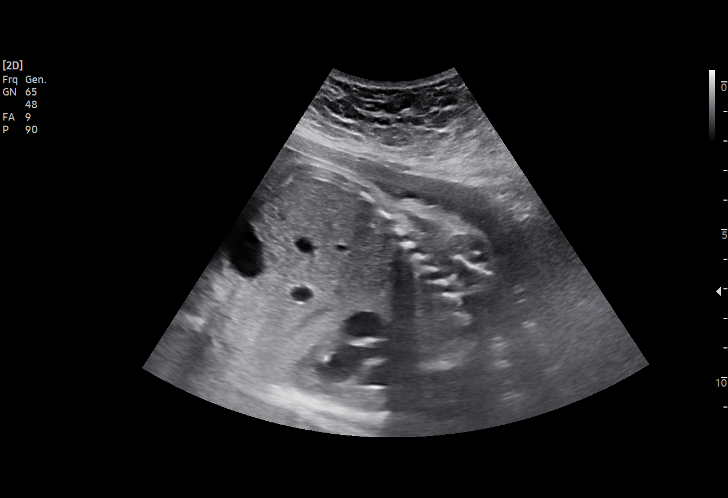
[im 76/82]
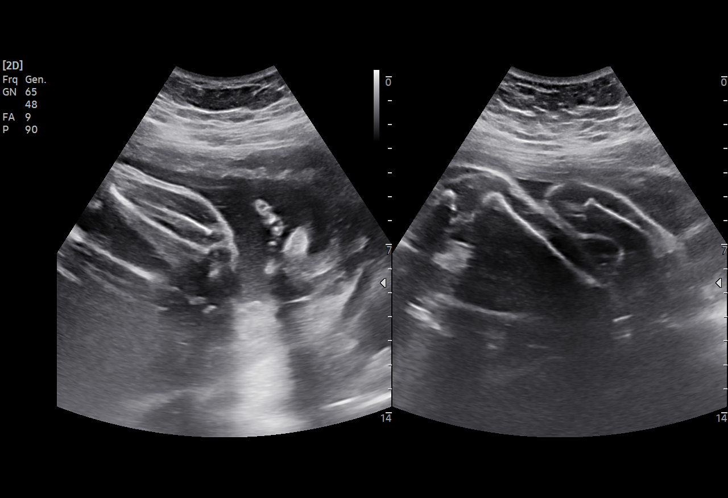
[im 82/82]
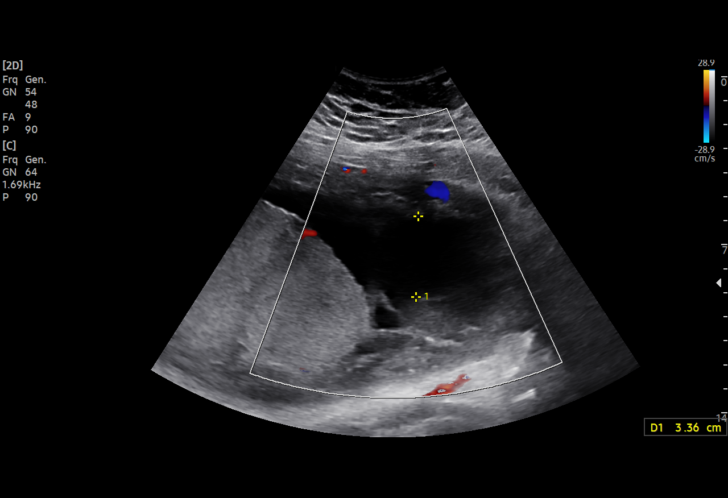

[15 of 28 positions shown; findings below may reference images not displayed]

FINDINGS: Number of Fetuses: 1

Heart Rate:  155 bpm

Movement: Yes

Presentation: Cephalic

Previa: No

Placental Location: Posterior

Amniotic Fluid (Subjective): Within normal limits

Amniotic Fluid (Objective):

AFI 12.1 cm (5%ile= 9.2 cm, 95%= 23.1 cm for 29 wks)

FETAL BIOMETRY

BPD:  8.0cm 32w 3d

HC:    28.4cm 31w 1d

AC:    27.0cm 31w 1d

FL:    6.0cm 31w 1d

Current Mean GA: 31w 3d US EDC: 03/15/2021

Assigned GA: 29w 6d Assigned EDC: 03/26/2021

Estimated Fetal Weight:  1,724g 72%ile

FETAL ANATOMY

Lateral Ventricles: Appears normal

Thalami/CSP: Appears normal

Posterior Fossa: Appears normal

Nuchal Region: Appears normal

Upper Lip: Appears normal

Spine: Appears normal

4 Chamber Heart on Left: Appears normal

LVOT: Appears normal

RVOT: Appears normal

Stomach on Left: Appears normal

3 Vessel Cord: Appears normal

Cord Insertion site: Appears normal

Kidneys: Appears normal

Bladder: Appears normal

Extremities: Previously seen

Sex: Previously Seen

Technical Limitations: Advanced gestational age

Maternal Findings:

Cervix:  5.9 cm TA
IMPRESSION: Assigned GA currently 29 weeks 6 days. Appropriate fetal growth,
with EFW currently at 72 %ile.

Amniotic fluid volume within normal limits, with AFI 12.1 cm.

Normal cervical length.

## 2022-06-04 ENCOUNTER — Other Ambulatory Visit: Payer: Self-pay

## 2022-06-04 ENCOUNTER — Encounter: Payer: Self-pay | Admitting: Obstetrics and Gynecology

## 2022-06-04 ENCOUNTER — Observation Stay
Admission: EM | Admit: 2022-06-04 | Discharge: 2022-06-04 | Disposition: A | Discharge status: Home / Self Care | Payer: Medicaid Other | Attending: Obstetrics | Admitting: Obstetrics

## 2022-06-04 ENCOUNTER — Ambulatory Visit (HOSPITAL_BASED_OUTPATIENT_CLINIC_OR_DEPARTMENT_OTHER): Payer: Medicaid Other

## 2022-06-04 ENCOUNTER — Telehealth: Payer: Self-pay

## 2022-06-04 VITALS — BP 120/80 | Ht 63.0 in | Wt 219.0 lb

## 2022-06-04 DIAGNOSIS — N760 Acute vaginitis: Secondary | ICD-10-CM | POA: Insufficient documentation

## 2022-06-04 DIAGNOSIS — O23593 Infection of other part of genital tract in pregnancy, third trimester: Principal | ICD-10-CM | POA: Insufficient documentation

## 2022-06-04 DIAGNOSIS — O26899 Other specified pregnancy related conditions, unspecified trimester: Secondary | ICD-10-CM | POA: Diagnosis not present

## 2022-06-04 DIAGNOSIS — O99891 Other specified diseases and conditions complicating pregnancy: Secondary | ICD-10-CM | POA: Diagnosis not present

## 2022-06-04 DIAGNOSIS — O26892 Other specified pregnancy related conditions, second trimester: Secondary | ICD-10-CM | POA: Diagnosis not present

## 2022-06-04 DIAGNOSIS — R102 Pelvic and perineal pain: Secondary | ICD-10-CM | POA: Diagnosis not present

## 2022-06-04 DIAGNOSIS — Z3A24 24 weeks gestation of pregnancy: Secondary | ICD-10-CM | POA: Insufficient documentation

## 2022-06-04 LAB — POCT URINALYSIS DIPSTICK OB
Bilirubin, UA: NEGATIVE
Blood, UA: NEGATIVE
Glucose, UA: NEGATIVE
Ketones, UA: NEGATIVE
Leukocytes, UA: NEGATIVE
Nitrite, UA: NEGATIVE
POC,PROTEIN,UA: NEGATIVE
Spec Grav, UA: 1.01 (ref 1.010–1.025)
Urobilinogen, UA: 1 E.U./dL
pH, UA: 6 (ref 5.0–8.0)

## 2022-06-04 LAB — URINALYSIS, COMPLETE (UACMP) WITH MICROSCOPIC
Bacteria, UA: NONE SEEN
Bilirubin Urine: NEGATIVE
Glucose, UA: NEGATIVE mg/dL
Hgb urine dipstick: NEGATIVE
Ketones, ur: NEGATIVE mg/dL
Leukocytes,Ua: NEGATIVE
Nitrite: NEGATIVE
Protein, ur: NEGATIVE mg/dL
Specific Gravity, Urine: 1.015 (ref 1.005–1.030)
pH: 6 (ref 5.0–8.0)

## 2022-06-04 LAB — WET PREP, GENITAL
Sperm: NONE SEEN
Trich, Wet Prep: NONE SEEN
WBC, Wet Prep HPF POC: <10 (ref <10)
Yeast Wet Prep HPF POC: NONE SEEN

## 2022-06-04 LAB — CHLAMYDIA/NGC RT PCR (ARMC ONLY)
Chlamydia Tr: NOT DETECTED
N gonorrhoeae: NOT DETECTED

## 2022-06-04 MED ORDER — LACTATED RINGERS IV SOLN: 500.0000 mL | INTRAVENOUS | Status: DC | PRN

## 2022-06-04 MED ORDER — METRONIDAZOLE 500 MG PO TABS: 500.0000 mg | ORAL_TABLET | Freq: Two times a day (BID) | ORAL | 0 refills | Status: DC

## 2022-06-04 MED ORDER — SOD CITRATE-CITRIC ACID 500-334 MG/5ML PO SOLN: 30.0000 mL | ORAL | Status: DC | PRN

## 2022-06-04 MED ORDER — ACETAMINOPHEN 325 MG PO TABS: 650.0000 mg | ORAL_TABLET | ORAL | Status: DC | PRN

## 2022-06-04 MED ORDER — ONDANSETRON HCL 4 MG/2ML IJ SOLN: 4.0000 mg | Freq: Four times a day (QID) | INTRAMUSCULAR | Status: DC | PRN

## 2022-06-04 NOTE — OB Triage Note (Addendum)
LABOR & DELIVERY OB TRIAGE NOTE  SUBJECTIVE  HPI Jody Mooney is a 27 y.o. [id number] at [redacted]w[redacted]d who presents to Labor & Delivery for vaginal discharge. She denies ctx, LOF, and vaginal bleeding. She endorses good fetal movement.  OB History     Gravida  4   Para  2   Term  2   Preterm      AB  1   Living  2      SAB  1   IAB      Ectopic      Multiple  0   Live Births  2           Scheduled Meds: Continuous Infusions:  lactated ringers     PRN Meds:.acetaminophen, lactated ringers, ondansetron, sodium citrate-citric acid  OBJECTIVE  BP 133/74   Pulse 89   Ht 5\' 3"  (1.6 m)   Wt 99.3 kg   LMP 12/22/2021 (Exact Date)   BMI 38.79 kg/m     FHT: 140s  ASSESSMENT Impression  1) Pregnancy at [id number], [redacted]w[redacted]d, Estimated Date of Delivery: 09/22/22. Reassuring maternal/fetal status 2) Bacterial vaginosis  PLAN 1) Rx for metronidazole sent to pharmacy. Instructions reviewed with Jody Mooney. Advised to abstain from intercourse until after abx completed. 2) Encouraged rest, hydration, abdominal support belt. 3) Discharge home with standard return/labor precautions. Keep scheduled ROB appts.   Guadlupe Spanish, CNM 06/04/22  2:17 PM

## 2022-06-04 NOTE — OB Triage Note (Signed)
Co bladder pressure x 2 days. Reports increase in urination and vaginal discharge. + fetal movement noted. Denies LOF and vaginal bleeding. Jody Mooney

## 2022-06-04 NOTE — Progress Notes (Signed)
    NURSE VISIT NOTE  Subjective:    Patient ID: Jody Mooney, female    DOB: 1995-09-07, 27 y.o.   MRN: [id number]       HPI  Patient is a 27 y.o. [phone] female who presents for pelvic pain and pressure for 2 day. Pt reports pressure so strong she needed to stop what she was doing and sit down. Pt reports these cramps for a few weeks. She has felt like this before but it was later in pregnancy. The pain and pressure is stronger when she is at work on her feet all day. They ease up after laying down. Patient denies dysuria, hematuria, urinary frequency, urinary urgency, flank pain, abdominal pain, cloudy malordorous urine, genital rash, genital irritation, and vaginal discharge.  Patient does not have a history of recurrent UTI.  Patient does not have a history of pyelonephritis.    Objective:    BP 120/80   Ht 5\' 3"  (1.6 m)   Wt 219 lb (99.3 kg)   LMP 12/22/2021 (Exact Date)   BMI 38.79 kg/m    Lab Review  No results found for any visits on 06/04/22.  Assessment:   1. Pelvic pressure in pregnancy   2. Pelvic pain      Plan:   Urine Culture Sent. Per Vonzella Nipple pt should go to Hospital for evaluation and FFN.    Cornelius Moras, CMA

## 2022-06-04 NOTE — Telephone Encounter (Signed)
Pt calling; is 26-27wks; having a lot of pressure in birth canal near the bladder; pressure is consistent; pt works in [employer] 10hr days without time to sit down and rest What to do?  [phone]  Pt states that the pressure is consistent and comes and goes; no trouble urinating; no bleeding or leaking fluid; is having contractions bur not more than four an hour.  Adv pt she may have a UTI as it manifests itself differently in preg sometimes.  Pt scheduled for nurse visit this am.

## 2022-06-04 NOTE — Patient Instructions (Signed)
Urinary Tract Infection, Adult A urinary tract infection (UTI) is an infection of any part of the urinary tract. The urinary tract includes: The kidneys. The ureters. The bladder. The urethra. These organs make, store, and get rid of pee (urine) in the body. What are the causes? This infection is caused by germs (bacteria) in your genital area. These germs grow and cause swelling (inflammation) of your urinary tract. What increases the risk? The following factors may make you more likely to develop this condition: Using a small, thin tube (catheter) to drain pee. Not being able to control when you pee or poop (incontinence). Being female. If you are female, these things can increase the risk: Using these methods to prevent pregnancy: A medicine that kills sperm (spermicide). A device that blocks sperm (diaphragm). Having low levels of a female hormone (estrogen). Being pregnant. You are more likely to develop this condition if: You have genes that add to your risk. You are sexually active. You take antibiotic medicines. You have trouble peeing because of: A prostate that is bigger than normal, if you are female. A blockage in the part of your body that drains pee from the bladder. A kidney stone. A nerve condition that affects your bladder. Not getting enough to drink. Not peeing often enough. You have other conditions, such as: Diabetes. A weak disease-fighting system (immune system). Sickle cell disease. Gout. Injury of the spine. What are the signs or symptoms? Symptoms of this condition include: Needing to pee right away. Peeing small amounts often. Pain or burning when peeing. Blood in the pee. Pee that smells bad or not like normal. Trouble peeing. Pee that is cloudy. Fluid coming from the vagina, if you are female. Pain in the belly or lower back. Other symptoms include: Vomiting. Not feeling hungry. Feeling mixed up (confused). This may be the first symptom in  older adults. Being tired and grouchy (irritable). A fever. Watery poop (diarrhea). How is this treated? Taking antibiotic medicine. Taking other medicines. Drinking enough water. In some cases, you may need to see a specialist. Follow these instructions at home:  Medicines Take over-the-counter and prescription medicines only as told by your doctor. If you were prescribed an antibiotic medicine, take it as told by your doctor. Do not stop taking it even if you start to feel better. General instructions Make sure you: Pee until your bladder is empty. Do not hold pee for a long time. Empty your bladder after sex. Wipe from front to back after peeing or pooping if you are a female. Use each tissue one time when you wipe. Drink enough fluid to keep your pee pale yellow. Keep all follow-up visits. Contact a doctor if: You do not get better after 1-2 days. Your symptoms go away and then come back. Get help right away if: You have very bad back pain. You have very bad pain in your lower belly. You have a fever. You have chills. You feeling like you will vomit or you vomit. Summary A urinary tract infection (UTI) is an infection of any part of the urinary tract. This condition is caused by germs in your genital area. There are many risk factors for a UTI. Treatment includes antibiotic medicines. Drink enough fluid to keep your pee pale yellow. This information is not intended to replace advice given to you by your health care provider. Make sure you discuss any questions you have with your health care provider. Document Revised: 09/28/2019 Document Reviewed: 09/28/2019 Elsevier Patient Education    2023 Elsevier Inc.  

## 2022-06-04 NOTE — OB Triage Note (Signed)
Discharge home. Left floor ambulatory by herself. Jody Mooney S  

## 2022-06-05 LAB — URINE CULTURE: Culture: NO GROWTH

## 2022-06-08 LAB — URINE CULTURE

## 2022-06-09 ENCOUNTER — Ambulatory Visit (INDEPENDENT_AMBULATORY_CARE_PROVIDER_SITE_OTHER): Payer: Medicaid Other | Admitting: Obstetrics and Gynecology

## 2022-06-09 ENCOUNTER — Encounter: Payer: Self-pay | Admitting: Obstetrics and Gynecology

## 2022-06-09 VITALS — BP 111/72 | HR 73 | Wt 225.3 lb

## 2022-06-09 DIAGNOSIS — Z3482 Encounter for supervision of other normal pregnancy, second trimester: Secondary | ICD-10-CM

## 2022-06-09 DIAGNOSIS — Z113 Encounter for screening for infections with a predominantly sexual mode of transmission: Secondary | ICD-10-CM

## 2022-06-09 DIAGNOSIS — Z3A25 25 weeks gestation of pregnancy: Secondary | ICD-10-CM

## 2022-06-09 DIAGNOSIS — Z131 Encounter for screening for diabetes mellitus: Secondary | ICD-10-CM

## 2022-06-09 DIAGNOSIS — Z3480 Encounter for supervision of other normal pregnancy, unspecified trimester: Secondary | ICD-10-CM

## 2022-06-09 LAB — POCT URINALYSIS DIPSTICK OB
Bilirubin, UA: NEGATIVE
Blood, UA: NEGATIVE
Glucose, UA: NEGATIVE
Ketones, UA: NEGATIVE
Leukocytes, UA: NEGATIVE
Nitrite, UA: NEGATIVE
POC,PROTEIN,UA: NEGATIVE
Spec Grav, UA: <=1.005 — AB (ref 1.010–1.025)
Urobilinogen, UA: 0.2 E.U./dL
pH, UA: 6.5 (ref 5.0–8.0)

## 2022-06-09 NOTE — Progress Notes (Signed)
ROB: She reports " smooth sailing".  Has occasional pelvic pressure but is taking medicine for BV.  Reports daily fetal movement.  1 hour GCT and repeat Horizon next visit.  Patient not taking vitamins as directed-strongly encouraged.  Not taking aspirin as directed.

## 2022-06-09 NOTE — Progress Notes (Signed)
ROB. Patient states daily fetal movement along with pressure and braxton hicks. Currently taking Flagyl.

## 2022-06-23 ENCOUNTER — Telehealth: Payer: Self-pay

## 2022-06-23 NOTE — Telephone Encounter (Signed)
Pt calling; was seen by [facility] yesterday; her iron was low which may be the answer as to why she has been so tired lately.  What to do?  Appt Friday.  [phone]  Adv to get an iron supplement  a day.

## 2022-06-25 ENCOUNTER — Ambulatory Visit (INDEPENDENT_AMBULATORY_CARE_PROVIDER_SITE_OTHER): Payer: Medicaid Other | Admitting: Obstetrics and Gynecology

## 2022-06-25 ENCOUNTER — Encounter: Payer: Self-pay | Admitting: Obstetrics and Gynecology

## 2022-06-25 ENCOUNTER — Other Ambulatory Visit: Payer: Medicaid Other

## 2022-06-25 VITALS — BP 108/73 | HR 77 | Wt 226.6 lb

## 2022-06-25 DIAGNOSIS — Z23 Encounter for immunization: Secondary | ICD-10-CM | POA: Diagnosis not present

## 2022-06-25 DIAGNOSIS — Z3A27 27 weeks gestation of pregnancy: Secondary | ICD-10-CM

## 2022-06-25 DIAGNOSIS — Z131 Encounter for screening for diabetes mellitus: Secondary | ICD-10-CM | POA: Diagnosis not present

## 2022-06-25 DIAGNOSIS — Z3480 Encounter for supervision of other normal pregnancy, unspecified trimester: Secondary | ICD-10-CM

## 2022-06-25 DIAGNOSIS — Z113 Encounter for screening for infections with a predominantly sexual mode of transmission: Secondary | ICD-10-CM

## 2022-06-25 DIAGNOSIS — Z3482 Encounter for supervision of other normal pregnancy, second trimester: Secondary | ICD-10-CM

## 2022-06-25 LAB — POCT URINALYSIS DIPSTICK OB
Bilirubin, UA: NEGATIVE
Blood, UA: NEGATIVE
Glucose, UA: NEGATIVE
Ketones, UA: NEGATIVE
Leukocytes, UA: NEGATIVE
Nitrite, UA: NEGATIVE
POC,PROTEIN,UA: NEGATIVE
Spec Grav, UA: 1.015 (ref 1.010–1.025)
Urobilinogen, UA: 0.2 E.U./dL
pH, UA: 6.5 (ref 5.0–8.0)

## 2022-06-25 NOTE — Progress Notes (Signed)
ROB. She states daily fetal movement along with pressure. Reports taking prenatals and ASA daily. Patient completed GCT, received TDAP injection and signed BTC today. Patient states no questions or concerns at this time.

## 2022-06-25 NOTE — Progress Notes (Signed)
ROB: No complaints-feels well.  Daily fetal movement.  Taking aspirin.  1 hour GCT today.

## 2022-06-26 LAB — 28 WEEK RH+PANEL
Basophils Absolute: 0 10*3/uL (ref 0.0–0.2)
Basos: 0 %
EOS (ABSOLUTE): 0.1 10*3/uL (ref 0.0–0.4)
Eos: 1 %
Gestational Diabetes Screen: 108 mg/dL (ref 70–139)
HIV Screen 4th Generation wRfx: NONREACTIVE
Hematocrit: 36.3 % (ref 34.0–46.6)
Hemoglobin: 11.4 g/dL (ref 11.1–15.9)
Immature Grans (Abs): 0 10*3/uL (ref 0.0–0.1)
Immature Granulocytes: 1 %
Lymphocytes Absolute: 1.1 10*3/uL (ref 0.7–3.1)
Lymphs: 13 %
MCH: 27.6 pg (ref 26.6–33.0)
MCHC: 31.4 g/dL — ABNORMAL LOW (ref 31.5–35.7)
MCV: 88 fL (ref 79–97)
Monocytes Absolute: 0.6 10*3/uL (ref 0.1–0.9)
Monocytes: 7 %
Neutrophils Absolute: 6.2 10*3/uL (ref 1.4–7.0)
Neutrophils: 78 %
Platelets: 195 10*3/uL (ref 150–450)
RBC: 4.13 x10E6/uL (ref 3.77–5.28)
RDW: 13.5 % (ref 11.7–15.4)
RPR Ser Ql: NONREACTIVE
WBC: 8 10*3/uL (ref 3.4–10.8)

## 2022-06-28 ENCOUNTER — Telehealth: Payer: Self-pay

## 2022-06-28 ENCOUNTER — Encounter: Payer: Self-pay | Admitting: Obstetrics and Gynecology

## 2022-06-28 NOTE — Telephone Encounter (Signed)
Patient calling in to state she has been leaking clear non odorous fluid the past 2-3 days. She also reports decreased fetal movement. Reports baby movement is typically bog and frequent but has been minimal today. Patient advised to go to L&D to be evaluated.

## 2022-07-12 IMAGING — US US OB FOLLOW-UP
1 series · 15 of 28 positions shown · non-contrast
Comparison: none

CLINICAL DATA: Assess fetal growth

EXAM:
OBSTETRIC 14+ WK ULTRASOUND FOLLOW-UP

[Series 1: us ob follow up · 15 of 40 slices shown]
[im 1/40]
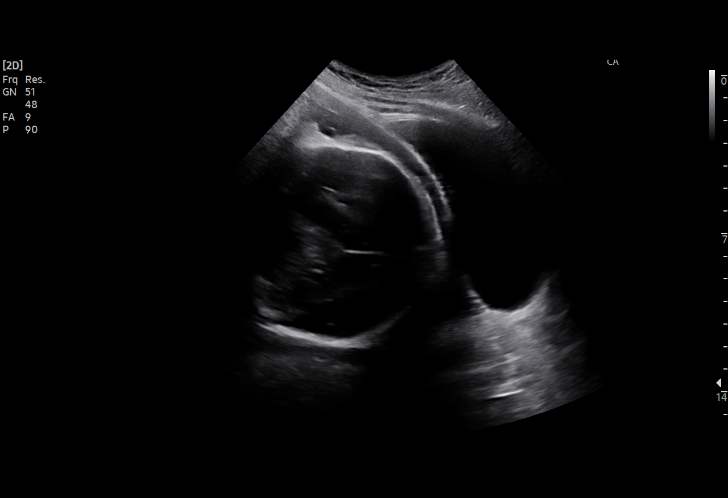
[im 3/40]
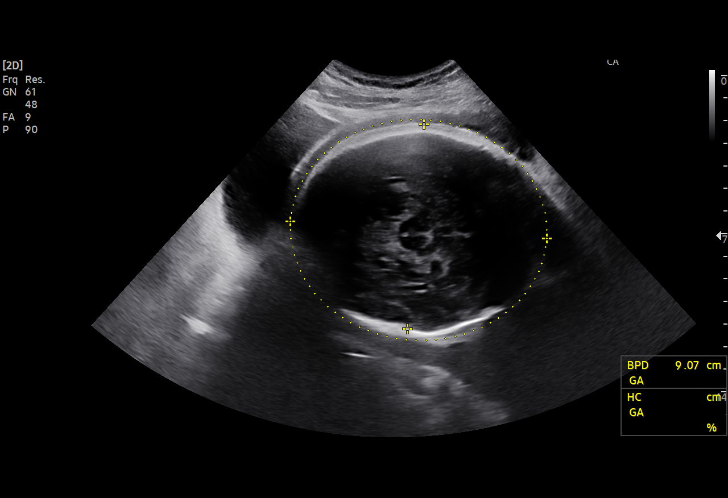
[im 6/40]
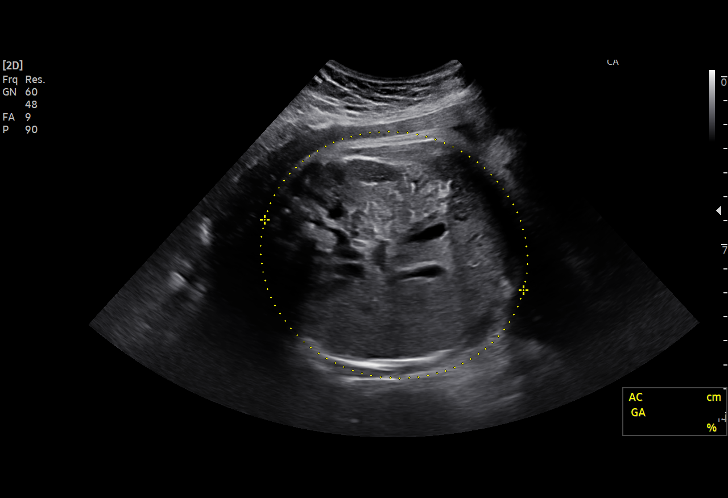
[im 9/40]
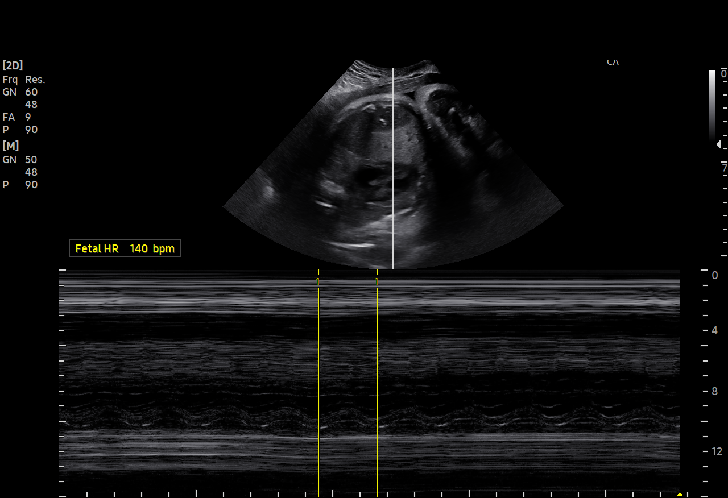
[im 12/40]
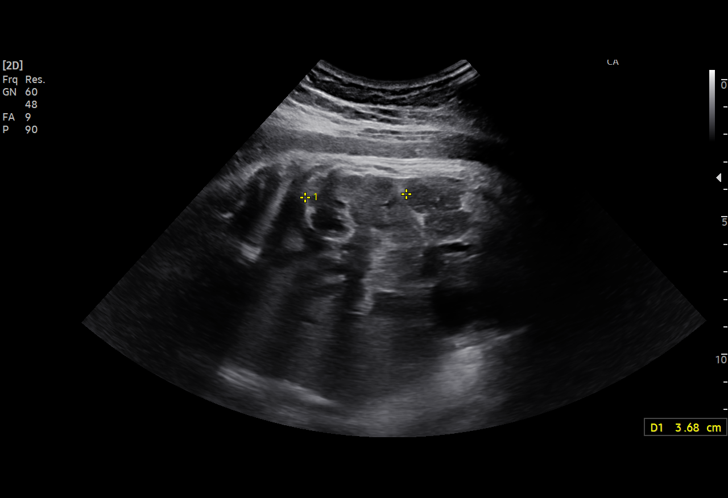
[im 15/40]
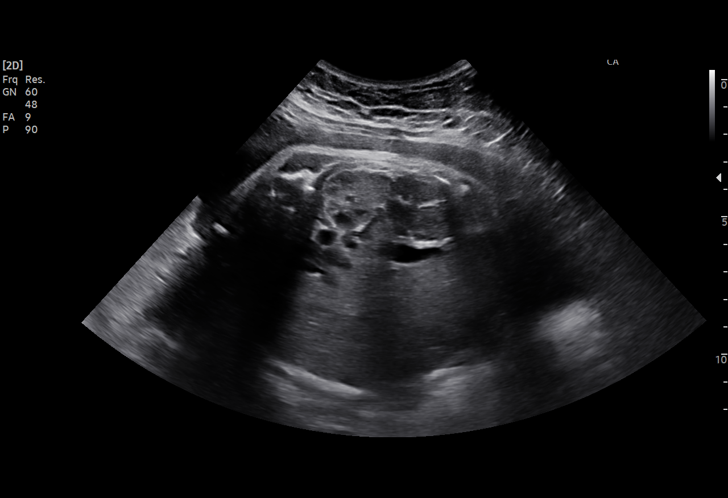
[im 18/40]
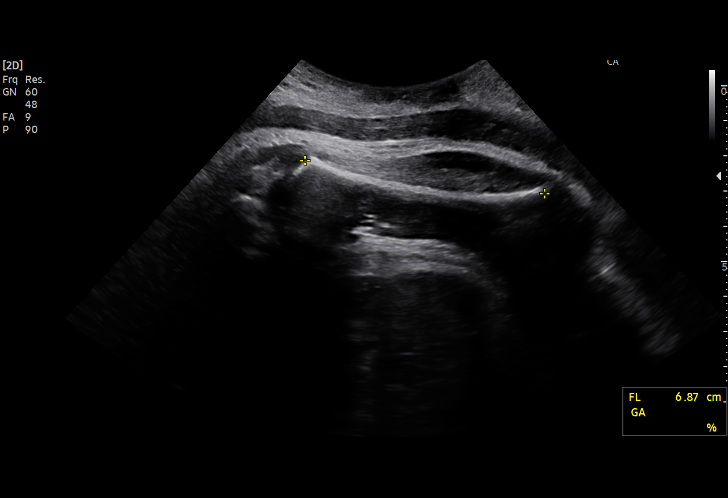
[im 21/40]
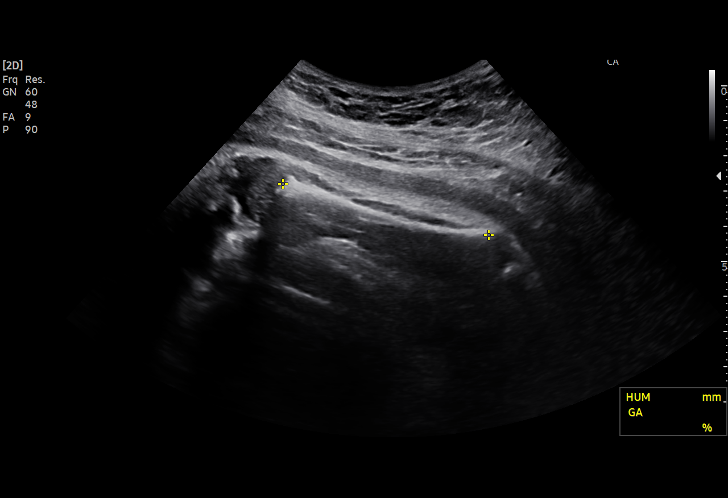
[im 22/40]
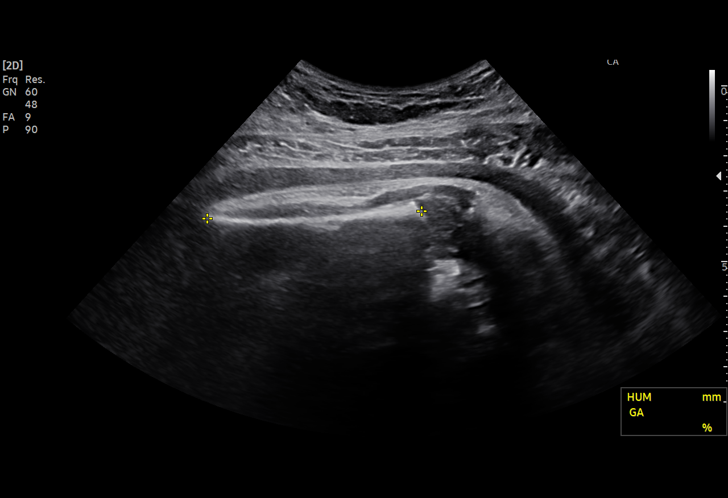
[im 25/40]
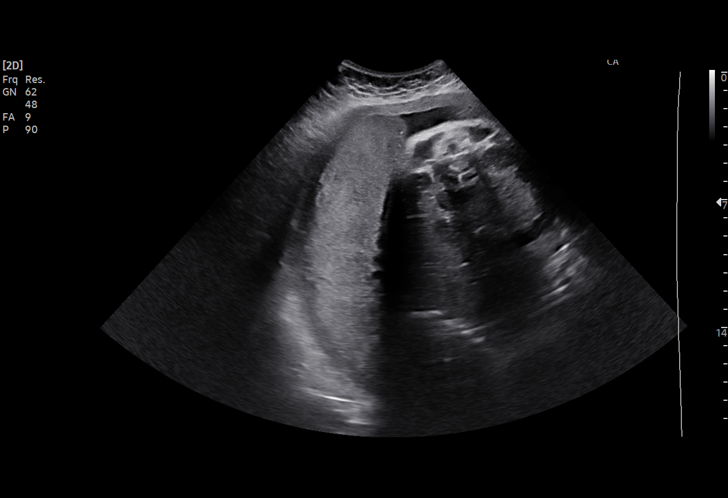
[im 28/40]
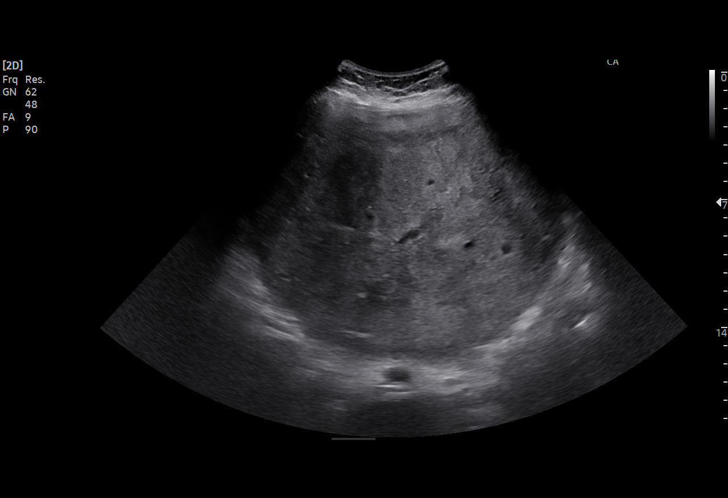
[im 31/40]
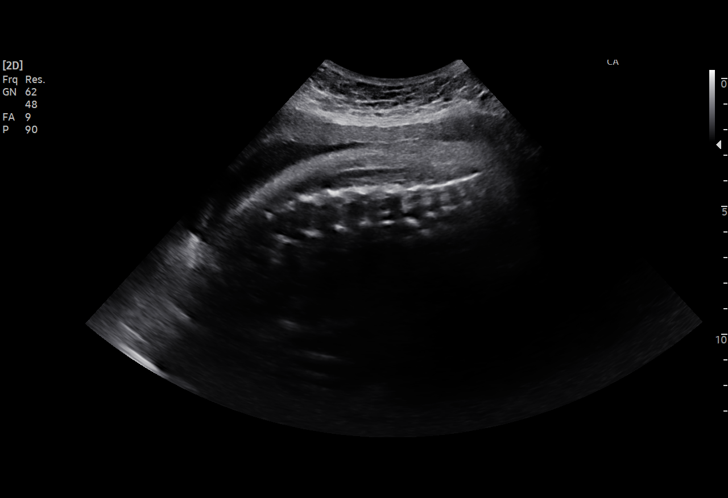
[im 34/40]
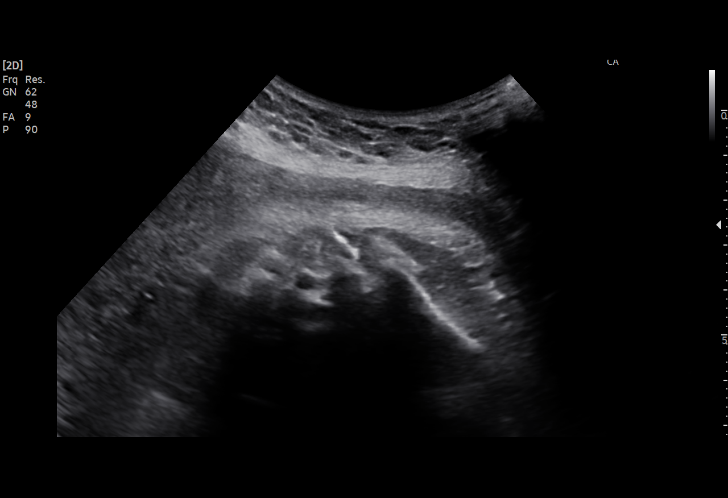
[im 37/40]
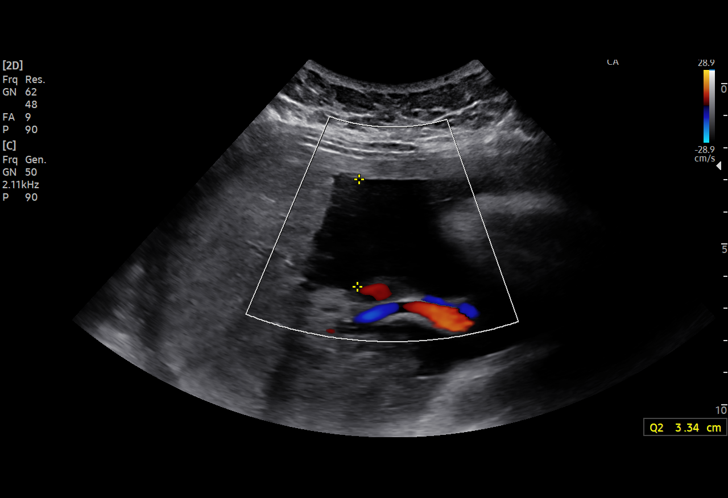
[im 40/40]
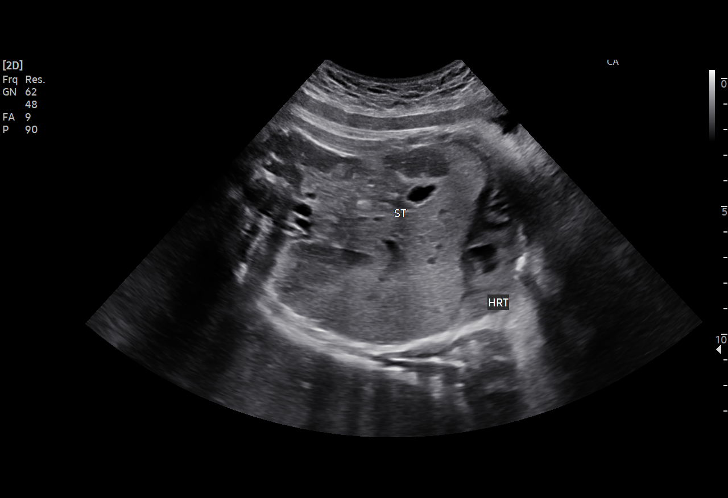

[15 of 28 positions shown; findings below may reference images not displayed]

FINDINGS: Number of Fetuses: 1

Heart Rate:  140 bpm

Movement: Yes

Presentation: Cephalic

Previa: No

Placental Location: Fundal

Amniotic Fluid (Subjective): Normal

Amniotic Fluid (Objective):

Vertical pocket 3.3cm

AFI 11.1 cm

FETAL BIOMETRY

BPD:  8.9cm 35w 6d

HC:    33.4cm 38w 1d

AC:    34.0cm 37w 6d

FL:    7.0cm 35w 5d

Current Mean GA: 36w 4d US EDC: 03/29/2021

Assigned GA: 37w 0d Assigned EDC: 03/26/2021

Estimated Fetal Weight:  3,140g 71.8%ile, previously 71.9 %ile

FETAL ANATOMY

Lateral Ventricles: Appears normal

Thalami/CSP: Previously seen

Posterior Fossa: Previously seen

Nuchal Region: Previously seen

Upper Lip: Previously seen

Spine: Previously seen

4 Chamber Heart on Left: Previously seen

LVOT: Previously seen

RVOT: Previously seen

Stomach on Left: Appears normal

3 Vessel Cord: Previously seen

Cord Insertion site: Previously seen

Kidneys: Appears normal

Bladder: Appears normal

Extremities: Previously seen

Sex: Previously Seen

Technical Limitations: Late gestational age

Maternal Findings:

Cervix:  Not evaluated
IMPRESSION: 1. Single live intrauterine pregnancy as above, estimated age 36
weeks and 4 days.
2. Estimated fetal weight at the seventy-first percentile, unchanged
since prior exam.
3. Limited evaluation of the fetal anatomy due to late gestational
age.

## 2022-07-16 ENCOUNTER — Ambulatory Visit (INDEPENDENT_AMBULATORY_CARE_PROVIDER_SITE_OTHER): Payer: Medicaid Other | Admitting: Obstetrics

## 2022-07-16 VITALS — BP 113/68 | HR 90 | Wt 228.2 lb

## 2022-07-16 DIAGNOSIS — Z3480 Encounter for supervision of other normal pregnancy, unspecified trimester: Secondary | ICD-10-CM

## 2022-07-16 DIAGNOSIS — Z3A30 30 weeks gestation of pregnancy: Secondary | ICD-10-CM

## 2022-07-16 DIAGNOSIS — Z3490 Encounter for supervision of normal pregnancy, unspecified, unspecified trimester: Secondary | ICD-10-CM

## 2022-07-16 DIAGNOSIS — Z3483 Encounter for supervision of other normal pregnancy, third trimester: Secondary | ICD-10-CM

## 2022-07-16 LAB — POCT URINALYSIS DIPSTICK OB
Bilirubin, UA: NEGATIVE
Blood, UA: NEGATIVE
Glucose, UA: NEGATIVE
Ketones, UA: NEGATIVE
Leukocytes, UA: NEGATIVE
Nitrite, UA: NEGATIVE
POC,PROTEIN,UA: NEGATIVE
Spec Grav, UA: 1.01 (ref 1.010–1.025)
Urobilinogen, UA: 0.2 E.U./dL
pH, UA: 7 (ref 5.0–8.0)

## 2022-07-16 NOTE — Progress Notes (Signed)
Routine Prenatal Care Visit  Subjective  Jody Mooney is a 27 y.o. [phone] at [redacted]w[redacted]d being seen today for ongoing prenatal care.  She is currently monitored for the following issues for this low-risk pregnancy and has Encounter for general adult medical examination with abnormal findings; Vaginal discharge; Obesity in pregnancy; Chest pain; Stressful job; Supervision of other normal pregnancy, antepartum; Susceptible to varicella (non-immune), currently pregnant; and Short interval between pregnancies affecting pregnancy, antepartum on their problem list.  ----------------------------------------------------------------------------------- Patient reports no complaints.   Contractions: Irritability. Vag. Bleeding: None.  Movement: Present. Leaking Fluid denies.  ----------------------------------------------------------------------------------- The following portions of the patient's history were reviewed and updated as appropriate: allergies, current medications, past family history, past medical history, past social history, past surgical history and problem list. Problem list updated.  Objective  Blood pressure 113/68, pulse 90, weight 228 lb 3.2 oz (103.5 kg), last menstrual period 12/22/2021, not currently breastfeeding. Pregravid weight 209 lb (94.8 kg) Total Weight Gain 19 lb 3.2 oz (8.709 kg) Urinalysis: Urine Protein Negative  Urine Glucose Negative  Fetal Status: Fetal Heart Rate (bpm): 149 Fundal Height: 33 cm Movement: Present     General:  Alert, oriented and cooperative. Patient is in no acute distress.  Skin: Skin is warm and dry. No rash noted.   Cardiovascular: Normal heart rate noted  Respiratory: Normal respiratory effort, no problems with respiration noted  Abdomen: Soft, gravid, appropriate for gestational age. Pain/Pressure: Present     Pelvic:  Cervical exam deferred        Extremities: Normal range of motion.  Edema: Trace  Mental Status: Normal mood and affect. Normal  behavior. Normal judgment and thought content.   Assessment   27 y.o. [id number] at [redacted]w[redacted]d by  09/22/2022, by Ultrasound presenting for routine prenatal visit Measuring high for dates today  Plan   fourth Problems (from 02/17/22 to present)     No problems associated with this episode.        Preterm labor symptoms and general obstetric precautions including but not limited to vaginal bleeding, contractions, leaking of fluid and fetal movement were reviewed in detail with the patient. Please refer to After Visit Summary for other counseling recommendations.  I will order a growth scan for her next visit.  Return in about 2 weeks (around 07/30/2022) for return OB, growth scan.  Mirna Mires, CNM  07/16/2022 10:54 AM

## 2022-08-02 ENCOUNTER — Ambulatory Visit (INDEPENDENT_AMBULATORY_CARE_PROVIDER_SITE_OTHER): Payer: Medicaid Other

## 2022-08-02 ENCOUNTER — Ambulatory Visit (INDEPENDENT_AMBULATORY_CARE_PROVIDER_SITE_OTHER): Payer: Medicaid Other | Admitting: Obstetrics

## 2022-08-02 VITALS — BP 101/69 | HR 85 | Wt 227.0 lb

## 2022-08-02 DIAGNOSIS — Z3A34 34 weeks gestation of pregnancy: Secondary | ICD-10-CM | POA: Diagnosis not present

## 2022-08-02 DIAGNOSIS — Z3480 Encounter for supervision of other normal pregnancy, unspecified trimester: Secondary | ICD-10-CM

## 2022-08-02 DIAGNOSIS — Z3490 Encounter for supervision of normal pregnancy, unspecified, unspecified trimester: Secondary | ICD-10-CM

## 2022-08-02 DIAGNOSIS — E669 Obesity, unspecified: Secondary | ICD-10-CM

## 2022-08-02 DIAGNOSIS — Z3A32 32 weeks gestation of pregnancy: Secondary | ICD-10-CM

## 2022-08-02 DIAGNOSIS — Z362 Encounter for other antenatal screening follow-up: Secondary | ICD-10-CM | POA: Diagnosis not present

## 2022-08-02 DIAGNOSIS — O99213 Obesity complicating pregnancy, third trimester: Secondary | ICD-10-CM

## 2022-08-02 DIAGNOSIS — O99210 Obesity complicating pregnancy, unspecified trimester: Secondary | ICD-10-CM

## 2022-08-02 LAB — POCT URINALYSIS DIPSTICK OB
Bilirubin, UA: NEGATIVE
Blood, UA: NEGATIVE
Glucose, UA: NEGATIVE
Ketones, UA: NEGATIVE
Leukocytes, UA: NEGATIVE
Nitrite, UA: NEGATIVE
POC,PROTEIN,UA: NEGATIVE
Spec Grav, UA: <=1.005 — AB (ref 1.010–1.025)
Urobilinogen, UA: 0.2 E.U./dL
pH, UA: 6.5 (ref 5.0–8.0)

## 2022-08-02 NOTE — Addendum Note (Signed)
Addended by: Donnetta Hail on: 08/02/2022 04:19 PM   Modules accepted: Orders

## 2022-08-02 NOTE — Progress Notes (Signed)
Routine Prenatal Care Visit  Subjective  Jody Mooney is a 27 y.o. [phone] at [redacted]w[redacted]d being seen today for ongoing prenatal care.  She is currently monitored for the following issues for this high-risk pregnancy and has Encounter for general adult medical examination with abnormal findings; Vaginal discharge; Obesity in pregnancy; Chest pain; Stressful job; Supervision of other normal pregnancy, antepartum; Susceptible to varicella (non-immune), currently pregnant; and Short interval between pregnancies affecting pregnancy, antepartum on their problem list.  ----------------------------------------------------------------------------------- Patient reports no complaints.  Had a growth scan today. Baby is moving well. Contractions: Irregular. Vag. Bleeding: None.  Movement: Present. Leaking Fluid denies.  ----------------------------------------------------------------------------------- The following portions of the patient's history were reviewed and updated as appropriate: allergies, current medications, past family history, past medical history, past social history, past surgical history and problem list. Problem list updated.  Objective  Blood pressure 101/69, pulse 85, weight 227 lb (103 kg), last menstrual period 12/22/2021, not currently breastfeeding. Pregravid weight 209 lb (94.8 kg) Total Weight Gain 18 lb (8.165 kg) Urinalysis: Urine Protein    Urine Glucose    Fetal Status: Fetal Heart Rate (bpm): 138 Fundal Height: 35 cm Movement: Present  Presentation: Vertex  General:  Alert, oriented and cooperative. Patient is in no acute distress.  Skin: Skin is warm and dry. No rash noted.   Cardiovascular: Normal heart rate noted  Respiratory: Normal respiratory effort, no problems with respiration noted  Abdomen: Soft, gravid, appropriate for gestational age. Pain/Pressure: Present     Pelvic:  Cervical exam deferred        Extremities: Normal range of motion.  Edema: Trace  Mental  Status: Normal mood and affect. Normal behavior. Normal judgment and thought content.   Assessment   27 y.o. [id number]  Growth scan today- 61.8%, AC 89%  Plan   fourth Problems (from 02/17/22 to present)     No problems associated with this episode.        Preterm labor symptoms and general obstetric precautions including but not limited to vaginal bleeding, contractions, leaking of fluid and fetal movement were reviewed in detail with the patient. Please refer to After Visit Summary for other counseling recommendations.   Return in about 2 weeks (around 08/16/2022) for return OB.  Mirna Mires, CNM  08/02/2022 4:14 PM

## 2022-08-04 ENCOUNTER — Encounter: Payer: Self-pay | Admitting: Obstetrics

## 2022-08-04 NOTE — Telephone Encounter (Signed)
Reached out to pt to schedule for possible leaking fluids.  Pt declined at this time and said that she was not wetting her underwear at this time.  She said that she would contact us if she had the problem again.

## 2022-08-07 ENCOUNTER — Observation Stay
Admission: EM | Admit: 2022-08-07 | Discharge: 2022-08-07 | Disposition: A | Discharge status: Home / Self Care | Payer: Medicaid Other | Attending: Certified Nurse Midwife | Admitting: Certified Nurse Midwife

## 2022-08-07 ENCOUNTER — Encounter: Payer: Self-pay | Admitting: Obstetrics and Gynecology

## 2022-08-07 ENCOUNTER — Other Ambulatory Visit: Payer: Self-pay

## 2022-08-07 DIAGNOSIS — Z3A33 33 weeks gestation of pregnancy: Secondary | ICD-10-CM | POA: Diagnosis not present

## 2022-08-07 DIAGNOSIS — O26893 Other specified pregnancy related conditions, third trimester: Secondary | ICD-10-CM | POA: Diagnosis not present

## 2022-08-07 DIAGNOSIS — M549 Dorsalgia, unspecified: Secondary | ICD-10-CM

## 2022-08-07 DIAGNOSIS — O368330 Maternal care for abnormalities of the fetal heart rate or rhythm, third trimester, not applicable or unspecified: Secondary | ICD-10-CM | POA: Insufficient documentation

## 2022-08-07 LAB — WET PREP, GENITAL
Clue Cells Wet Prep HPF POC: NONE SEEN
Sperm: NONE SEEN
Trich, Wet Prep: NONE SEEN
WBC, Wet Prep HPF POC: <10 (ref <10)
Yeast Wet Prep HPF POC: NONE SEEN

## 2022-08-07 LAB — ROM PLUS (ARMC ONLY): Rom Plus: NEGATIVE

## 2022-08-07 MED ORDER — ACETAMINOPHEN 500 MG PO TABS: 1000.0000 mg | ORAL_TABLET | Freq: Once | ORAL | Status: DC

## 2022-08-07 NOTE — OB Triage Note (Signed)
Patient is a  27 yo, G4P2, at 33 weeks 3 days. Patient presents with complaints of leakage of fluid, decreased fetal movement, and pain/pressure in lower back.  Patient denies any vaginal bleeding. Monitors applied and assessing. VSS. Initial fetal heart tone. CNM notified of patients arrival to unit.

## 2022-08-07 NOTE — OB Triage Note (Signed)
      L&D OB Triage Note  SUBJECTIVE Jody Mooney is a 27 y.o. [id number] female at [redacted]w[redacted]d, EDD Estimated Date of Delivery: 09/22/22 who presented to triage with complaints of back pain and leaking of fluid. She is feeling good movement and denies vaginal bleeding.   OB History  Gravida Para Term Preterm AB Living  4 2 2  0 1 2  SAB IAB Ectopic Multiple Live Births  1 0 0 0 2    # Outcome Date GA Lbr Len/2nd Weight Sex Delivery Anes PTL Lv  4 Current           3 SAB 10/2021     SAB     2 Term 03/20/21 [redacted]w[redacted]d / 00:17 3280 g F Vag-Spont None  LIV     Name: Chaddock,GIRL Yanni     Apgar1: 7  Apgar5: 9  1 Term 03/04/18 [redacted]w[redacted]d 07:26 / 00:12 2994 g F Vag-Spont None  LIV     Name: Yowell,GIRL Ky     Apgar1: 8  Apgar5: 9    Medications Prior to Admission  Medication Sig Dispense Refill Last Dose   aspirin EC 81 MG tablet Take 1 tablet (81 mg total) by mouth daily. Swallow whole. 30 tablet 12 08/07/2022   Prenat-FeCbn-FeAsp-Meth-FA-DHA (PRENATE MINI) 18-0.6-0.4-350 MG CAPS Take 1 each by mouth daily. 30 capsule 11 08/06/2022     OBJECTIVE  Nursing Evaluation:   BP 124/68 (BP Location: Right Arm)   Pulse 86   Temp 98.8 F (37.1 C) (Oral)   LMP 12/22/2021 (Exact Date)    Findings:   ROB plus negative for rupture, no active infections. Lower back pain in pregnancy      NST was performed and has been reviewed by me.  NST INTERPRETATION: Category I  Mode: External Baseline Rate (A): 135 bpm Variability: Moderate Accelerations: 15 x 15 Decelerations: None     Contraction Frequency (min): ui present  ASSESSMENT Impression:  1.  Pregnancy:  [id number] at [redacted]w[redacted]d , EDD Estimated Date of Delivery: 09/22/22 2.  Reassuring fetal and maternal status 3.  Back pain in pregnancy, encouraged tylenol and belly band.  PLAN 1. Current condition and above findings reviewed.  Reassuring fetal and maternal condition. 2. Discharge home with standard labor precautions given to return to L&D or call  the office for problems. 3. Continue routine prenatal care.  I was present and evaluated this pt in person.   Doreene Burke, CNM

## 2022-08-16 ENCOUNTER — Ambulatory Visit (INDEPENDENT_AMBULATORY_CARE_PROVIDER_SITE_OTHER): Payer: Medicaid Other | Admitting: Obstetrics

## 2022-08-16 VITALS — BP 116/66 | HR 81 | Wt 228.4 lb

## 2022-08-16 DIAGNOSIS — Z113 Encounter for screening for infections with a predominantly sexual mode of transmission: Secondary | ICD-10-CM

## 2022-08-16 DIAGNOSIS — Z3480 Encounter for supervision of other normal pregnancy, unspecified trimester: Secondary | ICD-10-CM

## 2022-08-16 DIAGNOSIS — Z3A34 34 weeks gestation of pregnancy: Secondary | ICD-10-CM

## 2022-08-16 DIAGNOSIS — Z3403 Encounter for supervision of normal first pregnancy, third trimester: Secondary | ICD-10-CM

## 2022-08-16 LAB — POCT URINALYSIS DIPSTICK OB
Bilirubin, UA: NEGATIVE
Blood, UA: NEGATIVE
Glucose, UA: NEGATIVE
Ketones, UA: NEGATIVE
Leukocytes, UA: NEGATIVE
Nitrite, UA: NEGATIVE
POC,PROTEIN,UA: NEGATIVE
Spec Grav, UA: 1.01 (ref 1.010–1.025)
Urobilinogen, UA: 0.2 E.U./dL
pH, UA: 6.5 (ref 5.0–8.0)

## 2022-08-16 NOTE — Progress Notes (Signed)
Routine Prenatal Care Visit  Subjective  Jody Mooney is a 27 y.o. [phone] at [redacted]w[redacted]d being seen today for ongoing prenatal care.  She is currently monitored for the following issues for this high-risk pregnancy and has Encounter for general adult medical examination with abnormal findings; Vaginal discharge; Obesity in pregnancy; Chest pain; Stressful job; Supervision of other normal pregnancy, antepartum; Susceptible to varicella (non-immune), currently pregnant; and Short interval between pregnancies affecting pregnancy, antepartum on their problem list.  ----------------------------------------------------------------------------------- Patient reports  some increased vaginal discharge, but was recently seen at [facility] and this is normal discharge of pregnancy. .   Contractions: Irritability. Vag. Bleeding: None.  Movement: Present. Leaking Fluid denies.  ----------------------------------------------------------------------------------- The following portions of the patient's history were reviewed and updated as appropriate: allergies, current medications, past family history, past medical history, past social history, past surgical history and problem list. Problem list updated.  Objective  Blood pressure 116/66, pulse 81, weight 228 lb 6.4 oz (103.6 kg), last menstrual period 12/22/2021, not currently breastfeeding. Pregravid weight 209 lb (94.8 kg) Total Weight Gain 19 lb 6.4 oz (8.8 kg) Urinalysis: Urine Protein    Urine Glucose    Fetal Status:     Movement: Present     General:  Alert, oriented and cooperative. Patient is in no acute distress.  Skin: Skin is warm and dry. No rash noted.   Cardiovascular: Normal heart rate noted  Respiratory: Normal respiratory effort, no problems with respiration noted  Abdomen: Soft, gravid, appropriate for gestational age. Pain/Pressure: Present     Pelvic:  Cervical exam deferred        Extremities: Normal range of motion.  Edema: Trace  Mental  Status: Normal mood and affect. Normal behavior. Normal judgment and thought content.   Assessment   27 y.o. [id number] at [redacted]w[redacted]d by  09/22/2022, by Ultrasound presenting for routine prenatal visit  Plan   fourth Problems (from 02/17/22 to present)     No problems associated with this episode.        Preterm labor symptoms and general obstetric precautions including but not limited to vaginal bleeding, contractions, leaking of fluid and fetal movement were reviewed in detail with the patient. Much verbal praise for keeping her weight gain low this pregnancy. We talked about discomforts of pregnancy- as this is her third, she feels more pelvic pressure. Starting her maternity leave soon.  Return in about 2 weeks (around 08/30/2022) for return OB, GBS, etc..  Mirna Mires, CNM  08/16/2022 10:51 AM

## 2022-08-27 ENCOUNTER — Emergency Department
Admission: EM | Admit: 2022-08-27 | Discharge: 2022-08-27 | Disposition: A | Discharge status: Home / Self Care | Payer: Medicaid Other | Attending: Emergency Medicine | Admitting: Emergency Medicine

## 2022-08-27 ENCOUNTER — Emergency Department: Payer: Medicaid Other

## 2022-08-27 ENCOUNTER — Observation Stay
Admission: AD | Admit: 2022-08-27 | Discharge: 2022-08-28 | Disposition: A | Discharge status: Home / Self Care | Payer: Medicaid Other | Attending: Obstetrics | Admitting: Obstetrics

## 2022-08-27 ENCOUNTER — Other Ambulatory Visit: Payer: Self-pay

## 2022-08-27 ENCOUNTER — Encounter: Payer: Self-pay | Admitting: Licensed Practical Nurse

## 2022-08-27 ENCOUNTER — Encounter: Payer: Self-pay | Admitting: Obstetrics and Gynecology

## 2022-08-27 DIAGNOSIS — M25562 Pain in left knee: Secondary | ICD-10-CM | POA: Diagnosis not present

## 2022-08-27 DIAGNOSIS — R519 Headache, unspecified: Secondary | ICD-10-CM | POA: Insufficient documentation

## 2022-08-27 DIAGNOSIS — O368330 Maternal care for abnormalities of the fetal heart rate or rhythm, third trimester, not applicable or unspecified: Secondary | ICD-10-CM | POA: Diagnosis not present

## 2022-08-27 DIAGNOSIS — M542 Cervicalgia: Secondary | ICD-10-CM | POA: Diagnosis not present

## 2022-08-27 DIAGNOSIS — S0990XA Unspecified injury of head, initial encounter: Secondary | ICD-10-CM | POA: Diagnosis not present

## 2022-08-27 DIAGNOSIS — Z3A36 36 weeks gestation of pregnancy: Secondary | ICD-10-CM | POA: Diagnosis not present

## 2022-08-27 DIAGNOSIS — O26893 Other specified pregnancy related conditions, third trimester: Secondary | ICD-10-CM | POA: Insufficient documentation

## 2022-08-27 DIAGNOSIS — O9A213 Injury, poisoning and certain other consequences of external causes complicating pregnancy, third trimester: Secondary | ICD-10-CM

## 2022-08-27 DIAGNOSIS — R109 Unspecified abdominal pain: Secondary | ICD-10-CM

## 2022-08-27 MED ORDER — ACETAMINOPHEN 325 MG PO TABS: 650.0000 mg | ORAL_TABLET | ORAL | Status: DC | PRN

## 2022-08-27 MED ORDER — HYDROXYZINE HCL 25 MG PO TABS: 50.0000 mg | ORAL_TABLET | Freq: Four times a day (QID) | ORAL | Status: DC | PRN

## 2022-08-27 MED ORDER — ONDANSETRON HCL 4 MG/2ML IJ SOLN: 4.0000 mg | Freq: Four times a day (QID) | INTRAMUSCULAR | Status: DC | PRN

## 2022-08-27 MED ORDER — SOD CITRATE-CITRIC ACID 500-334 MG/5ML PO SOLN: 30.0000 mL | ORAL | Status: DC | PRN

## 2022-08-27 MED ORDER — LACTATED RINGERS IV SOLN
500.0000 mL | INTRAVENOUS | Status: DC | PRN
  Administered 2022-08-28: 1000 mL via INTRAVENOUS
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
## 2022-08-27 NOTE — ED Notes (Signed)
Attempted FHT with pedal doppler which was the only one available without success however during attempts baby was moving and mother verified baby is moving. Christiane Ha, PA notified.

## 2022-08-28 ENCOUNTER — Encounter: Payer: Self-pay | Admitting: Obstetrics

## 2022-08-28 DIAGNOSIS — O26893 Other specified pregnancy related conditions, third trimester: Secondary | ICD-10-CM | POA: Diagnosis not present

## 2022-08-28 DIAGNOSIS — Z3A36 36 weeks gestation of pregnancy: Secondary | ICD-10-CM | POA: Diagnosis not present

## 2022-08-28 DIAGNOSIS — O368330 Maternal care for abnormalities of the fetal heart rate or rhythm, third trimester, not applicable or unspecified: Secondary | ICD-10-CM | POA: Diagnosis not present
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
## 2022-08-30 ENCOUNTER — Ambulatory Visit (INDEPENDENT_AMBULATORY_CARE_PROVIDER_SITE_OTHER): Payer: Medicaid Other | Admitting: Licensed Practical Nurse

## 2022-08-30 ENCOUNTER — Other Ambulatory Visit: Payer: Self-pay | Admitting: Licensed Practical Nurse

## 2022-08-30 ENCOUNTER — Encounter: Payer: Self-pay | Admitting: Licensed Practical Nurse

## 2022-08-30 ENCOUNTER — Other Ambulatory Visit (HOSPITAL_COMMUNITY)
Admission: RE | Admit: 2022-08-30 | Discharge: 2022-08-30 | Disposition: A | Discharge status: Home / Self Care | Payer: Medicaid Other | Source: Ambulatory Visit | Attending: Licensed Practical Nurse | Admitting: Licensed Practical Nurse

## 2022-08-30 VITALS — BP 123/72 | HR 98 | Wt 231.9 lb

## 2022-08-30 DIAGNOSIS — Z3480 Encounter for supervision of other normal pregnancy, unspecified trimester: Secondary | ICD-10-CM | POA: Diagnosis not present

## 2022-08-30 DIAGNOSIS — Z113 Encounter for screening for infections with a predominantly sexual mode of transmission: Secondary | ICD-10-CM | POA: Insufficient documentation

## 2022-08-30 DIAGNOSIS — Z3685 Encounter for antenatal screening for Streptococcus B: Secondary | ICD-10-CM

## 2022-08-30 DIAGNOSIS — Z3A36 36 weeks gestation of pregnancy: Secondary | ICD-10-CM

## 2022-08-30 DIAGNOSIS — Z3483 Encounter for supervision of other normal pregnancy, third trimester: Secondary | ICD-10-CM

## 2022-08-30 LAB — POCT URINALYSIS DIPSTICK
Bilirubin, UA: NEGATIVE
Blood, UA: NEGATIVE
Glucose, UA: NEGATIVE
Ketones, UA: NEGATIVE
Leukocytes, UA: NEGATIVE
Nitrite, UA: NEGATIVE
Protein, UA: NEGATIVE
Spec Grav, UA: 1.02 (ref 1.010–1.025)
Urobilinogen, UA: 0.2 E.U./dL
pH, UA: 6 (ref 5.0–8.0)

## 2022-08-30 NOTE — Telephone Encounter (Signed)
Per Tresa Endo will talk to her about this at her appointment.
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
## 2022-08-31 LAB — CERVICOVAGINAL ANCILLARY ONLY
Chlamydia: NEGATIVE
Comment: NEGATIVE
Comment: NORMAL
Neisseria Gonorrhea: NEGATIVE

## 2022-09-01 ENCOUNTER — Telehealth: Payer: Self-pay

## 2022-09-01 NOTE — Telephone Encounter (Signed)
I called [place] and left voicemail as I have a few questions for her FMLA forms

## 2022-09-03 LAB — STREP GP B CULTURE+RFLX: Strep Gp B Culture+Rflx: NEGATIVE

## 2022-09-03 NOTE — Progress Notes (Deleted)
    NURSE VISIT NOTE  Subjective:    Patient ID: Jody Mooney, female    DOB: 1995-04-14, 27 y.o.   MRN: [id number]  HPI  Patient is a 27 y.o. [phone] female who presents for fetal monitoring per order from Carie Caddy, [place].   Objective:    LMP 12/22/2021 (Exact Date)  Estimated Date of Delivery: 09/22/22  [redacted]w[redacted]d  Fetus A Non-Stress Test Interpretation for 09/03/22  Indication: Obesity            Assessment:   No diagnosis found.   Plan:   Results reviewed and discussed with patient by  Jody Mooney, CNM.     Rocco Serene, LPN

## 2022-09-05 ENCOUNTER — Encounter: Payer: Self-pay | Admitting: Certified Nurse Midwife

## 2022-09-05 ENCOUNTER — Other Ambulatory Visit: Payer: Self-pay

## 2022-09-05 ENCOUNTER — Inpatient Hospital Stay
Admission: EM | Admit: 2022-09-05 | Discharge: 2022-09-07 | DRG: 805 | Disposition: A | Discharge status: Home / Self Care | Payer: Medicaid Other | Attending: Certified Nurse Midwife | Admitting: Certified Nurse Midwife

## 2022-09-05 ENCOUNTER — Other Ambulatory Visit: Payer: Self-pay | Admitting: Certified Nurse Midwife

## 2022-09-05 DIAGNOSIS — O4202 Full-term premature rupture of membranes, onset of labor within 24 hours of rupture: Secondary | ICD-10-CM

## 2022-09-05 DIAGNOSIS — O4593 Premature separation of placenta, unspecified, third trimester: Secondary | ICD-10-CM | POA: Diagnosis present

## 2022-09-05 DIAGNOSIS — O99214 Obesity complicating childbirth: Secondary | ICD-10-CM | POA: Diagnosis present

## 2022-09-05 DIAGNOSIS — O4292 Full-term premature rupture of membranes, unspecified as to length of time between rupture and onset of labor: Principal | ICD-10-CM | POA: Diagnosis present

## 2022-09-05 DIAGNOSIS — Z3A37 37 weeks gestation of pregnancy: Secondary | ICD-10-CM

## 2022-09-05 DIAGNOSIS — E669 Obesity, unspecified: Secondary | ICD-10-CM | POA: Diagnosis not present

## 2022-09-05 LAB — CBC
HCT: 34.7 % — ABNORMAL LOW (ref 36.0–46.0)
Hemoglobin: 11.3 g/dL — ABNORMAL LOW (ref 12.0–15.0)
MCH: 26.7 pg (ref 26.0–34.0)
MCHC: 32.6 g/dL (ref 30.0–36.0)
MCV: 81.8 fL (ref 80.0–100.0)
Platelets: 221 10*3/uL (ref 150–400)
RBC: 4.24 MIL/uL (ref 3.87–5.11)
RDW: 14.7 % (ref 11.5–15.5)
WBC: 8.8 10*3/uL (ref 4.0–10.5)
nRBC: 0 % (ref 0.0–0.2)

## 2022-09-05 LAB — TYPE AND SCREEN
ABO/RH(D): O POS
Antibody Screen: NEGATIVE

## 2022-09-05 LAB — RUPTURE OF MEMBRANE (ROM)PLUS: Rom Plus: POSITIVE

## 2022-09-05 MED ORDER — TERBUTALINE SULFATE 1 MG/ML IJ SOLN: 0.2500 mg | Freq: Once | INTRAMUSCULAR | Status: DC | PRN

## 2022-09-05 MED ORDER — ONDANSETRON HCL 4 MG/2ML IJ SOLN: 4.0000 mg | Freq: Four times a day (QID) | INTRAMUSCULAR | Status: DC | PRN

## 2022-09-05 MED ORDER — ACETAMINOPHEN 325 MG PO TABS: 650.0000 mg | ORAL_TABLET | ORAL | Status: DC | PRN

## 2022-09-05 MED ORDER — LIDOCAINE HCL (PF) 1 % IJ SOLN: 30.0000 mL | INTRAMUSCULAR | Status: AC | PRN

## 2022-09-05 MED ORDER — AMMONIA AROMATIC IN INHA
RESPIRATORY_TRACT | Status: AC
  Filled 2022-09-05: qty 10

## 2022-09-05 MED ORDER — LACTATED RINGERS IV SOLN: 500.0000 mL | INTRAVENOUS | Status: DC | PRN

## 2022-09-05 MED ORDER — LACTATED RINGERS IV SOLN: INTRAVENOUS | Status: DC

## 2022-09-05 MED ORDER — OXYTOCIN-SODIUM CHLORIDE 30-0.9 UT/500ML-% IV SOLN
1.0000 m[IU]/min | INTRAVENOUS | Status: DC
  Administered 2022-09-05: 4 m[IU]/min via INTRAVENOUS
  Filled 2022-09-05 (×2): qty 500

## 2022-09-05 MED ORDER — SOD CITRATE-CITRIC ACID 500-334 MG/5ML PO SOLN: 30.0000 mL | ORAL | Status: DC | PRN

## 2022-09-05 MED ORDER — OXYTOCIN-SODIUM CHLORIDE 30-0.9 UT/500ML-% IV SOLN
2.5000 [IU]/h | INTRAVENOUS | Status: DC
  Administered 2022-09-06: 2.5 [IU]/h via INTRAVENOUS

## 2022-09-05 MED ORDER — MISOPROSTOL 200 MCG PO TABS
ORAL_TABLET | ORAL | Status: AC
  Filled 2022-09-05: qty 4

## 2022-09-05 MED ORDER — OXYTOCIN BOLUS FROM INFUSION
333.0000 mL | Freq: Once | INTRAVENOUS | Status: AC
  Administered 2022-09-06: 333 mL via INTRAVENOUS

## 2022-09-05 MED ORDER — OXYTOCIN 10 UNIT/ML IJ SOLN
INTRAMUSCULAR | Status: AC
  Filled 2022-09-05: qty 2

## 2022-09-05 MED ORDER — LIDOCAINE HCL (PF) 1 % IJ SOLN
INTRAMUSCULAR | Status: AC
  Administered 2022-09-06: 30 mL via SUBCUTANEOUS
  Filled 2022-09-05: qty 30

## 2022-09-05 MED ORDER — FENTANYL CITRATE (PF) 100 MCG/2ML IJ SOLN: 50.0000 ug | INTRAMUSCULAR | Status: DC | PRN

## 2022-09-05 NOTE — Progress Notes (Signed)
LABOR NOTE   Jody Mooney 27 y.o.GP@ at [redacted]w[redacted]d  SUBJECTIVE:  Pt feeling mild contractions only, denies that the pain is abnormal. State she is concerned regarding the bleeding  Analgesia: Labor support without medications  OBJECTIVE:  BP 135/73 (BP Location: Right Arm)   Pulse 89   Temp 98.1 F (36.7 C) (Oral)   Ht 5\' 3"  (1.6 m)   Wt 106 kg   LMP 12/22/2021 (Exact Date)   BMI 41.40 kg/m  Total I/O In: -  Out: 190 [Blood:190]  She has shown cervical change. CERVIX: 3-4 cm :  70%:   -3:   mid position:   firm SVE:   Dilation: 3 Effacement (%): 50 Station: -3 Exam by:: Katie RN CONTRACTIONS: regular, every 2 minutes FHR: Fetal heart tracing reviewed. Baseline: 150 bpm, Variability: Good {> 6 bpm), Accelerations: Reactive, and Decelerations: Absent Category I    Labs: Lab Results  Component Value Date   WBC 8.8 09/05/2022   HGB 11.3 (L) 09/05/2022   HCT 34.7 (L) 09/05/2022   MCV 81.8 09/05/2022   PLT 221 09/05/2022    ASSESSMENT: 1) Labor curve reviewed.       Progress: Early latent labor. and Significant vaginal bleeding probable marginal separation of placenta.     Membranes: ruptured, fluid bloody            Active Problems:   Labor and delivery, indication for care   PLAN: Discussed with pt and her partner that she and the baby continue to be stable. Discussed reassuring FHR, accelerations, and the absence of decelerations. Discussed that signs of blood loss including changes in her heart rate, the babies heart rate, feeling dizzy, nausea and vomiting. She is not experiencing any of these symptoms at this time. PT notified that I have informed Dr. Valentino Saxon and that she is in agreement with current plan . However, should she or her partner become uncomfortable and want to have cesarean delivery that I would notify DR. Cherry. They agree.    Doreene Burke, CNM  09/05/2022 11:31 PM

## 2022-09-05 NOTE — Progress Notes (Signed)
LABOR NOTE   Jody Mooney 27 y.o.GP@ at [redacted]w[redacted]d  SUBJECTIVE:  Pt called out with concern for having a "pop" followed by a gush of bright red bleeding.  Analgesia: Labor support without medications  OBJECTIVE:  BP 135/73 (BP Location: Right Arm)   Pulse 89   Temp 98.1 F (36.7 C) (Oral)   Ht 5\' 3"  (1.6 m)   Wt 106 kg   LMP 12/22/2021 (Exact Date)   BMI 41.40 kg/m  No intake/output data recorded.  She has not shown cervical change. CERVIX: 3 cm:  50%:   -3:   posterior:   firm SVE:   Dilation: 3 Effacement (%): 50 Station: -3 Exam by:: Katie RN CONTRACTIONS: regular, every 2-3 minutes FHR: Fetal heart tracing reviewed. Baseline: 140 bpm, Variability: Good {> 6 bpm), Accelerations: Reactive, and Decelerations: Absent Category I    Labs: Lab Results  Component Value Date   WBC 8.8 09/05/2022   HGB 11.3 (L) 09/05/2022   HCT 34.7 (L) 09/05/2022   MCV 81.8 09/05/2022   PLT 221 09/05/2022    ASSESSMENT: 1) Labor curve reviewed.       Progress: Not in labor.     Membranes: ruptured, bloody fluid     Active Problems:   Labor and delivery, indication for care   PLAN: Discussed concern for bright red bleeding , possibly placental abruption. Reassuring fetal and maternal status. Discussed continued augmentation with pt as long as she and fetus continue to do well. Dr. Valentino Saxon notified and in agreement of plan.    Doreene Burke, CNM  09/05/2022 10:49 PM

## 2022-09-05 NOTE — H&P (Signed)
History and Physical   HPI  Jody Mooney is a 27 y.o. [id number] at [redacted]w[redacted]d Estimated Date of Delivery: 09/22/22 who is being admitted for PROM. She first noticed fluid yesterday with the loss of her mucus plug yesterday around 1930.    OB History  OB History  Gravida Para Term Preterm AB Living  4 2 2  0 1 2  SAB IAB Ectopic Multiple Live Births  1 0 0 0 2    # Outcome Date GA Lbr Len/2nd Weight Sex Delivery Anes PTL Lv  4 Current           3 SAB 10/2021     SAB     2 Term 03/20/21 [redacted]w[redacted]d / 00:17 3280 g F Vag-Spont None  LIV     Name: Inocencio,GIRL Marquitta     Apgar1: 7  Apgar5: 9  1 Term 03/04/18 [redacted]w[redacted]d 07:26 / 00:12 2994 g F Vag-Spont None  LIV     Name: Debo,GIRL Shakura     Apgar1: 8  Apgar5: 9    PROBLEM LIST  Pregnancy complications or risks: Patient Active Problem List   Diagnosis Date Noted   Labor and delivery, indication for care 08/27/2022   Short interval between pregnancies affecting pregnancy, antepartum 05/12/2022   Susceptible to varicella (non-immune), currently pregnant 03/10/2022   Supervision of other normal pregnancy, antepartum 10/21/2021   Obesity in pregnancy 04/17/2019   Chest pain 04/17/2019   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   Vaginal discharge 11/08/2017   Encounter for general adult medical examination with abnormal findings 11/03/2011    Prenatal labs and studies: ABO, Rh: O/Positive/-- (01/08 1029) Antibody: Negative (01/08 1029) Rubella: 4.89 (01/08 1029) RPR: Non Reactive (04/26 0954)  HBsAg: Negative (01/08 1029)  HIV: Non Reactive (04/26 0954)  JYN:WGNFAOZH/-- (07/01 1009)   Past Medical History:  Diagnosis Date   Asthma      Past Surgical History:  Procedure Laterality Date   CYSTOSCOPY N/A 05/15/2019   Procedure: CYSTOSCOPY;  Surgeon: Natale Milch, MD;  Location: [facility] ORS;  Service: Gynecology;  Laterality: N/A;   LAPAROSCOPY  05/15/2019   Procedure: LAPAROSCOPY DIAGNOSTIC WITH RIGHT OOPHORECTOMY AND LEFT OVARIAN  CYSTECTOMY;  Surgeon: Natale Milch, MD;  Location: [facility] OR[facility];  Service: Gynecology;;   WISDOM TOOTH EXTRACTION       Medications    Current Discharge Medication List     CONTINUE these medications which have NOT CHANGED   Details  aspirin EC 81 MG tablet Take 1 tablet (81 mg total) by mouth daily. Swallow whole. Qty: 30 tablet, Refills: 12   Associated Diagnoses: Supervision of other normal pregnancy, antepartum; BMI 38.0-38.9,adult    Prenat-FeCbn-FeAsp-Meth-FA-DHA (PRENATE MINI) 18-0.6-0.4-350 MG CAPS Take 1 each by mouth daily. Qty: 30 capsule, Refills: 11   Associated Diagnoses: Encounter for supervision of other normal pregnancy in first trimester         Allergies  Vancomycin and Penicillins  Review of Systems  Constitutional: negative Eyes: negative Ears, nose, mouth, throat, and face: negative Respiratory: negative Cardiovascular: negative Gastrointestinal: negative Genitourinary:negative Integument/breast: negative Hematologic/lymphatic: negative Musculoskeletal:negative Neurological: negative Behavioral/Psych: negative Endocrine: negative Allergic/Immunologic: negative  Physical Exam  BP 135/73 (BP Location: Right Arm)   Pulse 89   Temp 98.1 F (36.7 C) (Oral)   Ht 5\' 3"  (1.6 m)   Wt 106 kg   LMP 12/22/2021 (Exact Date)   BMI 41.40 kg/m   Lungs:  CTA B Cardio: RRR without M/R/G Abd: Soft, gravid, NT  Presentation: cephalic EXT: No C/C/ 1+ Edema DTRs: 2+ B CERVIX: Dilation: 3 Effacement (%): 50 Station: -3 Exam by:: Pharmacist, hospital  See Prenatal records for more detailed PE.     FHR:  Baseline: 135 bpm, Variability: Good {> 6 bpm), Accelerations: Reactive, and Decelerations: Absent  Toco: Uterine Contractions: Frequency: Every 3-5 minutes and Intensity: mild  Test Results  Results for orders placed or performed during the hospital encounter of 09/05/22 (from the past 24 hour(s))  Rupture of Membrane (ROM) Plus     Status: None    Collection Time: 09/05/22  7:45 PM  Result Value Ref Range   Rom Plus POSITIVE    Group B Strep negative  Assessment   G4P2012 at [redacted]w[redacted]d Estimated Date of Delivery: 09/22/22  The fetus is reassuring.   Patient Active Problem List   Diagnosis Date Noted   Labor and delivery, indication for care 08/27/2022   Short interval between pregnancies affecting pregnancy, antepartum 05/12/2022   Susceptible to varicella (non-immune), currently pregnant 03/10/2022   Supervision of other normal pregnancy, antepartum 10/21/2021   Obesity in pregnancy 04/17/2019   Chest pain 04/17/2019   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   Vaginal discharge 11/08/2017   Encounter for general adult medical examination with abnormal findings 11/03/2011    Plan  1. Admit to L&D :   2. EFM:-- Category 1 3. IV pain medication or Epidural if desired.   4. Admission labs  5. Dr. Valentino Saxon notified of admission 6. Pitocin augmentation for prolonged rupture, discussed with pt. She is in agreement to use of pitocin.   Doreene Burke, CNM  09/05/2022 9:16 PM

## 2022-09-05 NOTE — OB Triage Note (Signed)
Patient is G4P2 [redacted]w[redacted]d c/o ctx and los of mucous pluf and fluid around 730pm last night. Ctx reported since 2pm about 5-10 min apart. Denies any other complaints. Rates ctx 8/10. FHR 155 cat 1 tracing CNM notified.

## 2022-09-06 ENCOUNTER — Other Ambulatory Visit: Payer: Medicaid Other

## 2022-09-06 ENCOUNTER — Ambulatory Visit: Payer: Medicaid Other

## 2022-09-06 ENCOUNTER — Encounter: Payer: Self-pay | Admitting: Certified Nurse Midwife

## 2022-09-06 ENCOUNTER — Encounter: Payer: Medicaid Other | Admitting: Obstetrics

## 2022-09-06 DIAGNOSIS — O4202 Full-term premature rupture of membranes, onset of labor within 24 hours of rupture: Secondary | ICD-10-CM | POA: Diagnosis not present

## 2022-09-06 DIAGNOSIS — E669 Obesity, unspecified: Secondary | ICD-10-CM | POA: Diagnosis not present

## 2022-09-06 DIAGNOSIS — O99214 Obesity complicating childbirth: Secondary | ICD-10-CM | POA: Diagnosis not present

## 2022-09-06 DIAGNOSIS — Z3A37 37 weeks gestation of pregnancy: Secondary | ICD-10-CM | POA: Diagnosis not present

## 2022-09-06 LAB — RPR: RPR Ser Ql: NONREACTIVE

## 2022-09-06 MED ORDER — WITCH HAZEL-GLYCERIN EX PADS
MEDICATED_PAD | CUTANEOUS | Status: DC | PRN
  Filled 2022-09-06: qty 100

## 2022-09-06 MED ORDER — OXYTOCIN-SODIUM CHLORIDE 30-0.9 UT/500ML-% IV SOLN: 2.5000 [IU]/h | INTRAVENOUS | Status: DC | PRN

## 2022-09-06 MED ORDER — ACETAMINOPHEN 325 MG PO TABS
650.0000 mg | ORAL_TABLET | ORAL | Status: DC | PRN
  Administered 2022-09-06: 650 mg via ORAL
  Filled 2022-09-06: qty 2

## 2022-09-06 MED ORDER — VARICELLA VIRUS VACCINE LIVE 1350 PFU/0.5ML IJ SUSR
0.5000 mL | Freq: Once | INTRAMUSCULAR | Status: DC
  Filled 2022-09-06: qty 0.5

## 2022-09-06 MED ORDER — IBUPROFEN 600 MG PO TABS
600.0000 mg | ORAL_TABLET | Freq: Four times a day (QID) | ORAL | Status: DC
  Administered 2022-09-06 – 2022-09-07 (×5): 600 mg via ORAL
  Filled 2022-09-06 (×5): qty 1

## 2022-09-06 MED ORDER — SIMETHICONE 80 MG PO CHEW: 80.0000 mg | CHEWABLE_TABLET | ORAL | Status: DC | PRN

## 2022-09-06 MED ORDER — PRENATAL MULTIVITAMIN CH
1.0000 | ORAL_TABLET | Freq: Every day | ORAL | Status: DC
  Administered 2022-09-06 – 2022-09-07 (×2): 1 via ORAL
  Filled 2022-09-06 (×2): qty 1

## 2022-09-06 MED ORDER — COCONUT OIL OIL
1.0000 | TOPICAL_OIL | Status: DC | PRN
  Filled 2022-09-06: qty 15

## 2022-09-06 MED ORDER — BENZOCAINE-MENTHOL 20-0.5 % EX AERO
1.0000 | INHALATION_SPRAY | CUTANEOUS | Status: DC | PRN
  Administered 2022-09-06: 1 via TOPICAL
  Filled 2022-09-06: qty 56

## 2022-09-06 MED ORDER — DIPHENHYDRAMINE HCL 25 MG PO CAPS: 25.0000 mg | ORAL_CAPSULE | Freq: Four times a day (QID) | ORAL | Status: DC | PRN

## 2022-09-06 MED ORDER — OXYCODONE-ACETAMINOPHEN 5-325 MG PO TABS: 1.0000 | ORAL_TABLET | ORAL | Status: DC | PRN

## 2022-09-06 MED ORDER — DOCUSATE SODIUM 100 MG PO CAPS
100.0000 mg | ORAL_CAPSULE | Freq: Two times a day (BID) | ORAL | Status: DC
  Administered 2022-09-07: 100 mg via ORAL
  Filled 2022-09-06 (×2): qty 1

## 2022-09-06 NOTE — Progress Notes (Signed)
From previous shift 

## 2022-09-06 NOTE — Discharge Summary (Signed)
Postpartum Discharge Summary  Date of Service updated***     Patient Name: Jody Mooney DOB: [birth year] MRN: [id number]  Date of admission: 09/05/2022 Delivery date:09/06/2022  Delivering provider: FREE, Lindalou Hose  Date of discharge: 09/06/2022  Admitting diagnosis: Labor and delivery, indication for care [O75.9] Intrauterine pregnancy: [redacted]w[redacted]d     Secondary diagnosis:  Active Problems:   Labor and delivery, indication for care  Additional problems: ROM>24 hours    Discharge diagnosis: Term Pregnancy Delivered                                              Post partum procedures:{Postpartum procedures:23558} Augmentation: Pitocin Complications: Possible partial placental abruption  Hospital course: Induction of Labor With Vaginal Delivery   27 y.o. yo [phone] at [redacted]w[redacted]d was admitted to the hospital 09/05/2022 for induction of labor.  Indication for induction: PROM.  Patient had an labor course complicated by vaginal bleeding, possible partial placental abruption. Membrane Rupture Time/Date:  ,   Delivery Method:Vaginal, Spontaneous  Episiotomy: None  Lacerations:  Periurethral  Details of delivery can be found in separate delivery note.  Patient had a postpartum course complicated by***. Patient is discharged home 09/06/22.  Newborn Data: Birth date:09/06/2022  Birth time:7:51 AM  Gender:Female  Living status:Living  Apgars:9 ,9  Weight:   Magnesium Sulfate received: {Mag received:30440022} BMZ received: No Rhophylac:N/A MMR:No Varicella: Given PP*** T-DaP:Given prenatally Flu: N/A Transfusion:{Transfusion received:30440034}  Physical exam  Vitals:   09/06/22 0810 09/06/22 0825 09/06/22 0840 09/06/22 0843  BP: 130/75 (!) 130/92 127/81 127/81  Pulse: 85 84 79 79  Temp:      TempSrc:      Weight:      Height:       General: {Exam; general:21111117} Lochia: {Desc; appropriate/inappropriate:30686::"appropriate"} Uterine Fundus: {Desc; firm/soft:30687} Incision: {Exam;  incision:21111123} DVT Evaluation: {Exam; dvt:2111122} Labs: Lab Results  Component Value Date   WBC 8.8 09/05/2022   HGB 11.3 (L) 09/05/2022   HCT 34.7 (L) 09/05/2022   MCV 81.8 09/05/2022   PLT 221 09/05/2022      Latest Ref Rng & Units 01/30/2022   11:40 AM  CMP  Glucose 70 - 99 mg/dL 147   BUN 6 - 20 mg/dL 8   Creatinine 8.29 - 5.62 mg/dL 1.30   Sodium 865 - 784 mmol/L 138   Potassium 3.5 - 5.1 mmol/L 3.6   Chloride 98 - 111 mmol/L 109   CO2 22 - 32 mmol/L 22   Calcium 8.9 - 10.3 mg/dL 8.5    Edinburgh Score:    02/17/2022    2:28 PM  Edinburgh Postnatal Depression Scale Screening Tool  I have been able to laugh and see the funny side of things. 0  I have looked forward with enjoyment to things. 0  I have blamed myself unnecessarily when things went wrong. 0  I have been anxious or worried for no good reason. 0  I have felt scared or panicky for no good reason. 0  Things have been getting on top of me. 0  I have been so unhappy that I have had difficulty sleeping. 0  I have felt sad or miserable. 0  I have been so unhappy that I have been crying. 0  The thought of harming myself has occurred to me. 0  Edinburgh Postnatal Depression Scale Total 0  After visit meds:  Allergies as of 09/06/2022       Reactions   Vancomycin Itching, Other (See Comments)   Red man syndrome   Penicillins Rash   Has patient had a PCN reaction causing immediate rash, facial/tongue/throat swelling, SOB or lightheadedness with hypotension: Unknown Has patient had a PCN reaction causing severe rash involving mucus membranes or skin necrosis: Unknown Has patient had a PCN reaction that required hospitalization: Unknown Has patient had a PCN reaction occurring within the last 10 years: no If all of the above answers are "NO", then may proceed with Cephalosporin use.     Med Rec must be completed prior to using this [facility]***        Discharge home in stable  condition Infant Feeding: {Baby feeding:23562} Infant Disposition:{CHL IP OB HOME WITH JXBJYN:82956} Discharge instruction: per After Visit Summary and Postpartum booklet. Activity: Advance as tolerated. Pelvic rest for 6 weeks.  Diet: {OB diet:21111121} Anticipated Birth Control: {Birth Control:23956} Postpartum Appointment:{Outpatient follow up:23559} Additional Postpartum F/U: {PP Procedure:23957} Future Appointments:No future appointments. Follow up Visit:  Follow-up Information     Free, Lindalou Hose, CNM Follow up in 2 week(s).   Specialty: Obstetrics and Gynecology Why: Postpartum telehealth visit Contact information: [address] [place] [address] [phone]         [facility] [place]. Schedule an appointment as soon as possible for a visit in 6 week(s).   Specialty: Obstetrics and Gynecology Why: 6 week in-person postpartum visit Contact information: [address] [place] [address] [phone]        [facility] .   Specialty: Radiology Contact information: [address] [place] [address] [phone]                    09/06/2022 Lindalou Hose Free, CNM

## 2022-09-06 NOTE — Lactation Note (Signed)
This note was copied from a baby's chart. Lactation Consultation Note  Patient Name: Jody Mooney VHQIO'N Date: 09/06/2022 Age:26 hours Reason for consult: Initial assessment;Early term 37-38.6wks   Maternal Data Has patient been taught Hand Expression?: Yes Does the patient have breastfeeding experience prior to this delivery?: Yes How long did the patient breastfeed?: 6 mths  Feeding Mother's Current Feeding Choice: Breast Milk Baby has been sleepy since last feeding at 0900, assisted mom with waking infant, mom hand expressed drops of colostrum from left breast and baby began rooting, latched easily and is nursing well with many audible swallows heard.   LATCH Score Latch: Grasps breast easily, tongue down, lips flanged, rhythmical sucking.  Audible Swallowing: Spontaneous and intermittent  Type of Nipple: Everted at rest and after stimulation  Comfort (Breast/Nipple): Soft / non-tender  Hold (Positioning): No assistance needed to correctly position infant at breast.  LATCH Score: 10   Lactation Tools Discussed/Used  Mom expressed interest in pumping to save milk , encouraged to offer breast first and if she desired to pump afterwards we could set up the pump kit that is in room.   LC name and no written on white board for mom to call for questions or concerns   Interventions Interventions: Assisted with latch;Skin to skin;Support pillows;Education  Discharge Pump: Personal;DEBP (DEBP kit given pts RN) WIC Program: Yes  Consult Status Consult Status: PRN    Dyann Kief 09/06/2022, 2:57 PM

## 2022-09-06 NOTE — Progress Notes (Signed)
LABOR NOTE   Jody Mooney 27 y.o.GP@ at [redacted]w[redacted]d  SUBJECTIVE:  Pt uncomfortable with contractions , having some rectal pressure. She denies urge to push at this time.  Analgesia: Labor support without medications  OBJECTIVE:  BP 118/63   Pulse 96   Temp 98.1 F (36.7 C) (Oral)   Ht 5\' 3"  (1.6 m)   Wt 106 kg   LMP 12/22/2021 (Exact Date)   BMI 41.40 kg/m  Total I/O In: -  Out: 420 [Blood:420]  She has shown cervical change. CERVIX: 6cm:  100%:   -2:   mid position:   soft SVE:   Dilation: 4.5 Effacement (%): 70 Station: -2 Exam by:: K Allred RN CONTRACTIONS: regular, every 2 minutes FHR: Fetal heart tracing reviewed. Baseline: 130 bpm, Variability: Good {> 6 bpm), Accelerations: Reactive, and Decelerations: Absent Category I    Labs: Lab Results  Component Value Date   WBC 8.8 09/05/2022   HGB 11.3 (L) 09/05/2022   HCT 34.7 (L) 09/05/2022   MCV 81.8 09/05/2022   PLT 221 09/05/2022    ASSESSMENT: 1) Labor curve reviewed.       Progress: Active phase labor.     Membranes: ruptured, fore bag ruptured, bloody fluid seen       Active Problems:   Labor and delivery, indication for care   PLAN: continue present management   Doreene Burke, CNM  09/06/2022 4:56 AM

## 2022-09-06 NOTE — Progress Notes (Signed)
LABOR NOTE   Jody Mooney 27 y.o.GP@ at [redacted]w[redacted]d  SUBJECTIVE:  Feeling pressure with contraction Analgesia: Labor support without medications  OBJECTIVE:  BP 118/63   Pulse 96   Temp 98.1 F (36.7 C) (Oral)   Ht 5\' 3"  (1.6 m)   Wt 106 kg   LMP 12/22/2021 (Exact Date)   BMI 41.40 kg/m  No intake/output data recorded.  She has shown cervical change. CERVIX: 7 cm:  100%:   0:   mid position:   soft SVE:   Dilation: 6 Effacement (%): 100 Station: -2 Exam by:: Krystalynn Ridgeway cnm CONTRACTIONS: regular, every 2-3 minutes FHR: Fetal heart tracing reviewed. Baseline: 130 bpm, Variability: Good {> 6 bpm), Accelerations: Reactive, and Decelerations: occasional early and variable  Category II    Labs: Lab Results  Component Value Date   WBC 8.8 09/05/2022   HGB 11.3 (L) 09/05/2022   HCT 34.7 (L) 09/05/2022   MCV 81.8 09/05/2022   PLT 221 09/05/2022    ASSESSMENT: 1) Labor curve reviewed.       Progress: Active phase labor.     Membranes: ruptured, bloody       Active Problems:   Labor and delivery, indication for care   PLAN: continue present management   Doreene Burke, CNM  09/06/2022 7:13 AM

## 2022-09-07 LAB — CBC
HCT: 27 % — ABNORMAL LOW (ref 36.0–46.0)
Hemoglobin: 8.7 g/dL — ABNORMAL LOW (ref 12.0–15.0)
MCH: 27.2 pg (ref 26.0–34.0)
MCHC: 32.2 g/dL (ref 30.0–36.0)
MCV: 84.4 fL (ref 80.0–100.0)
Platelets: 170 10*3/uL (ref 150–400)
RBC: 3.2 MIL/uL — ABNORMAL LOW (ref 3.87–5.11)
RDW: 14.8 % (ref 11.5–15.5)
WBC: 9 10*3/uL (ref 4.0–10.5)
nRBC: 0 % (ref 0.0–0.2)

## 2022-09-07 MED ORDER — WITCH HAZEL-GLYCERIN EX PADS: MEDICATED_PAD | CUTANEOUS | 12 refills | Status: DC | PRN

## 2022-09-07 MED ORDER — DOCUSATE SODIUM 100 MG PO CAPS: 100.0000 mg | ORAL_CAPSULE | Freq: Two times a day (BID) | ORAL | 0 refills | Status: DC | PRN

## 2022-09-07 MED ORDER — ACETAMINOPHEN 325 MG PO TABS: 1000.0000 mg | ORAL_TABLET | Freq: Three times a day (TID) | ORAL | Status: DC | PRN

## 2022-09-07 MED ORDER — BENZOCAINE-MENTHOL 20-0.5 % EX AERO: 1.0000 | INHALATION_SPRAY | CUTANEOUS | Status: DC | PRN

## 2022-09-07 MED ORDER — IBUPROFEN 600 MG PO TABS: 600.0000 mg | ORAL_TABLET | Freq: Four times a day (QID) | ORAL | 0 refills | Status: DC

## 2022-09-07 NOTE — Discharge Instructions (Signed)

## 2022-09-07 NOTE — Lactation Note (Signed)
This note was copied from a baby's chart. Lactation Consultation Note  Patient Name: Jody Mooney UEAVW'U Date: 09/07/2022 Age:26 hours Reason for consult: Initial assessment;Early term 37-38.6wks  Lactation Rounds: LC to the room for a visit. Mother is using the hand pump and baby is asleep in the bassinet. Mother states feeds are going well when baby wants to feed. States he will not latch when he does not want to. Mother is expressing about 20-73ml's per pump session for the last two pump sessions. Discussed baby's gestational age and how 61 weeker's can be "tricky' feeders and may fall asleep quickly.  LC reviewed and encouraged feeding on demand and with cues. If baby is not cueing we encourage hand expression and spoon feed to wake baby. Baby should eat a minimum of 8x's/24hours or more. Reviewed diaper counts for days of life and when to call Peds with questions. Reviewed "understanding Postpartum and Newborn care " booklet at bedside. Reviewed outpatient Lactation number and resources. Mother is renting a Symphony pump for home use. Discussed [email or web address] resources for discussion on Lecithin. Parents stated understanding with all teaching.   Maternal Data Does the patient have breastfeeding experience prior to this delivery?: Yes How long did the patient breastfeed?: 6 mo  Feeding Mother's Current Feeding Choice: Breast Milk (breast and expressed breastmilk in bottle)  Interventions Interventions: Breast feeding basics reviewed;Expressed milk;DEBP;Education (MOB rented Symphony for 1 month)  Discharge Discharge Education: Engorgement and breast care;Warning signs for feeding baby;Outpatient recommendation Pump: Rented;Hands Free;Manual (has a momcozy at home, Rented [employer]) [facility] Program: Yes  Consult Status Consult Status: Complete    Agustin Swatek D Ansen Sayegh 09/07/2022, 10:31 AM

## 2022-09-09 ENCOUNTER — Encounter: Payer: Self-pay | Admitting: Obstetrics and Gynecology

## 2022-09-09 ENCOUNTER — Encounter: Payer: Self-pay | Admitting: Family Medicine

## 2022-09-11 ENCOUNTER — Encounter: Payer: Self-pay | Admitting: Obstetrics and Gynecology

## 2022-09-13 ENCOUNTER — Encounter: Payer: Medicaid Other | Admitting: Obstetrics

## 2022-09-13 ENCOUNTER — Ambulatory Visit: Payer: Medicaid Other

## 2022-09-13 NOTE — Telephone Encounter (Signed)
Spoke with patient, she states her bleeding seems to be regular. She denies fever or any other warning signs at this time.

## 2022-09-14 ENCOUNTER — Ambulatory Visit: Payer: Medicaid Other

## 2022-09-14 ENCOUNTER — Telehealth: Payer: Self-pay

## 2022-09-14 NOTE — Telephone Encounter (Signed)
[facility]- Discharge Call Backs- Spoke to patient on the phone about the following below. 1-Do you have any questions or concerns about yourself as you heal?No 2-Any concerns or questions about your baby?No Is your baby eating, peeing,pooping well?Yes 3-Reviewed ABC's of safe sleep. 4-How was your stay at the hospital?Great 5- Did our team work together to care for you?Yes You should be receiving a survey in the mail soon.   We would really appreciate it if you could fill that out for [place] and return it in the mail.  We value the feedback to make improvements and continue the great work we do.   If you have any questions please feel free to call me back at [phone]

## 2022-09-15 ENCOUNTER — Encounter: Payer: Self-pay | Admitting: Certified Nurse Midwife

## 2022-09-20 ENCOUNTER — Telehealth (INDEPENDENT_AMBULATORY_CARE_PROVIDER_SITE_OTHER): Payer: Medicaid Other

## 2022-09-20 ENCOUNTER — Encounter: Payer: Medicaid Other | Admitting: Obstetrics

## 2022-09-20 NOTE — Progress Notes (Signed)
   Virtual Visit via Video Note   I connected withNAME@  on 09/20/22 3:44 PM by a video enabled telemedicine application and verified that I am speaking with the correct person using two identifiers.   Location: Patient: home Provider: [facility]   I discussed the limitations of evaluation and management by telemedicine and the availability of in person appointments. The patient expressed understanding and agreed to proceed. Subjective:    Subjective  Jody Mooney is a 27 y.o. female who presents for a postpartum visit. She is2 weeks  postpartum following a spontaneous vaginal delivery. I have fully reviewed the prenatal and intrapartum course. The delivery was at [redacted]w[redacted]d gestational age.  Anesthesia: local.    Postpartum course has been normal. She has no pain and no other concerns. Baby's course has been normal. Baby is feeding by  breast and has regained birth weight. Bleeding  is getting lighter. Passed a clot with some tissue around 3-4 days PP, but has not had any issues since that time .   Contraception method:  Paragard at 6 weeks PP visit . Postpartum depression screening: EPDS 1, feels good mood-wise. First week was hard because baby had elevated jaundice but now doing well.     Review of Systems Pertinent positives noted in HPI. Remainder of comprehensive ROS otherwise negative.     Objective:      Objective  There were no vitals taken for this visit.  General:  alert, cooperative, and no distress        Assessment:    Normal postpartum course, mood stable.   Plan:      Plan - In-person 6 week PP visit with Paragard IUD insertion.   I discussed the assessment and treatment plan with the patient. The patient was provided an opportunity to ask questions and all were answered. The patient agreed with the plan and demonstrated an understanding of the instructions.   The patient was advised to call back or seek an in-person evaluation if the symptoms worsen or  if the condition fails to improve as anticipated.   I provided 10 minutes of non-face-to-face time during this encounter.     Lindalou Hose Shequila Neglia, CNM

## 2022-09-23 NOTE — Telephone Encounter (Signed)
I contacted the patient via phone. Per Shanda Bumps A she would see the patient today 7/25 at 11:15 am work in. The patient was unavailable due to "her baby having an appointment at 11:00 am today". I offer tomorrow 7/26 at 10:35 am with Shanda Bumps A. The patient has confirmed this appointment.

## 2022-09-24 ENCOUNTER — Encounter: Payer: Self-pay | Admitting: Certified Nurse Midwife

## 2022-09-24 ENCOUNTER — Ambulatory Visit: Payer: Medicaid Other | Admitting: Certified Nurse Midwife

## 2022-09-24 NOTE — Progress Notes (Signed)
Lincoln Brigham, MD   Chief Complaint  Patient presents with   Wound Check    HPI:      Jody Mooney is a 27 y.o. [phone] had a NSVD of viable female infant 09/06/22, presents today for stitch check. She has had discomfort due to something poking her & thought it was hair, she attempted to remove it but was unable to. Infant son doing well, breastfeeding without complication. Her bleeding is scant to light without clots or odor.    Patient Active Problem List   Diagnosis Date Noted   Labor and delivery, indication for care 08/27/2022   Short interval between pregnancies affecting pregnancy, antepartum 05/12/2022   Susceptible to varicella (non-immune), currently pregnant 03/10/2022   Supervision of other normal pregnancy, antepartum 10/21/2021   Obesity in pregnancy 04/17/2019   Chest pain 04/17/2019   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   Vaginal discharge 11/08/2017   Encounter for general adult medical examination with abnormal findings 11/03/2011    Past Surgical History:  Procedure Laterality Date   CYSTOSCOPY N/A 05/15/2019   Procedure: CYSTOSCOPY;  Surgeon: Natale Milch, MD;  Location: [facility] ORS;  Service: Gynecology;  Laterality: N/A;   LAPAROSCOPY  05/15/2019   Procedure: LAPAROSCOPY DIAGNOSTIC WITH RIGHT OOPHORECTOMY AND LEFT OVARIAN CYSTECTOMY;  Surgeon: Natale Milch, MD;  Location: [facility] ORS;  Service: Gynecology;;   WISDOM TOOTH EXTRACTION      Family History  Problem Relation Age of Onset   Anxiety disorder Mother    Hyperlipidemia Mother    Healthy Father    Healthy Sister    Healthy Brother    Healthy Brother    Anxiety disorder Maternal Grandmother    Diabetes Maternal Grandmother    Diabetes Maternal Grandfather    Anxiety disorder Other        Great Grandmother   Depression Other        Grandmother and mother   Hyperlipidemia Other    Diabetes type II Other        Grand parents   Other Other        VSD repaired in grandmother age 57     Social History   Socioeconomic History   Marital status: Significant Other    Spouse name: Mellody Dance   Number of children: 2   Years of education: 16   Highest education level: Not on file  Occupational History   Occupation: runs a Soil scientist  Tobacco Use   Smoking status: Never   Smokeless tobacco: Never  Vaping Use   Vaping status: Never Used  Substance and Sexual Activity   Alcohol use: No   Drug use: Never   Sexual activity: Not Currently    Partners: Male    Birth control/protection: None  Other Topics Concern   Not on file  Social History Narrative   Currently attending college.    Feels safe in relationship of 2 years.   Social Determinants of Health   Financial Resource Strain: Low Risk  (02/17/2022)   Overall Financial Resource Strain (CARDIA)    Difficulty of Paying Living Expenses: Not hard at all  Food Insecurity: No Food Insecurity (09/05/2022)   Hunger Vital Sign    Worried About Running Out of Food in the Last Year: Never true    Ran Out of Food in the Last Year: Never true  Transportation Needs: No Transportation Needs (09/05/2022)   PRAPARE - Administrator, Civil Service (Medical): No    Lack  of Transportation (Non-Medical): No  Physical Activity: Insufficiently Active (02/17/2022)   Exercise Vital Sign    Days of Exercise per Week: 2 days    Minutes of Exercise per Session: 30 min  Stress: No Stress Concern Present (02/17/2022)   [employer] of Occupational Health - Occupational Stress Questionnaire    Feeling of Stress : Not at all  Social Connections: Moderately Isolated (02/17/2022)   Social Connection and Isolation Panel [NHANES]    Frequency of Communication with Friends and Family: More than three times a week    Frequency of Social Gatherings with Friends and Family: Twice a week    Attends Religious Services: Never    Database administrator or Organizations: No    Attends Banker Meetings: Never     Marital Status: Living with partner  Intimate Partner Violence: Not At Risk (09/05/2022)   Humiliation, Afraid, Rape, and Kick questionnaire    Fear of Current or Ex-Partner: No    Emotionally Abused: No    Physically Abused: No    Sexually Abused: No    Outpatient Medications Prior to Visit  Medication Sig Dispense Refill   acetaminophen (TYLENOL) 325 MG tablet Take 3 tablets (975 mg total) by mouth every 8 (eight) hours as needed (for pain scale < 4).     benzocaine-Menthol (DERMOPLAST) 20-0.5 % AERO Apply 1 Application topically as needed for irritation (perineal discomfort).     docusate sodium (COLACE) 100 MG capsule Take 1 capsule (100 mg total) by mouth 2 (two) times daily as needed for mild constipation. 10 capsule 0   ibuprofen (ADVIL) 600 MG tablet Take 1 tablet (600 mg total) by mouth every 6 (six) hours. 30 tablet 0   Prenat-FeCbn-FeAsp-Meth-FA-DHA (PRENATE MINI) 18-0.6-0.4-350 MG CAPS Take 1 each by mouth daily. 30 capsule 11   witch hazel-glycerin (TUCKS) pad Apply topically as needed for itching. 40 each 12   No facility-administered medications prior to visit.      ROS:  Review of Systems  Constitutional: Negative.   Respiratory: Negative.    Cardiovascular: Negative.   Gastrointestinal: Negative.   Genitourinary:  Positive for vaginal bleeding.       Long stitch from repair causing pain     OBJECTIVE:   Vitals:  BP 97/65   Pulse 72   Ht 5\' 4"  (1.626 m)   Wt 210 lb (95.3 kg)   Breastfeeding Yes   BMI 36.05 kg/m   Physical Exam Vitals reviewed.  Constitutional:      Appearance: Normal appearance.  Cardiovascular:     Rate and Rhythm: Normal rate.  Pulmonary:     Effort: Pulmonary effort is normal.  Genitourinary:    Comments: Stitch remnant to left inner periurethral area removed without complication. Laceration well approximated and healing without complication. Normal lochia. Neurological:     Mental Status: She is alert.    Results: No  results found for this or any previous visit (from the past 24 hour(s)).   Assessment/Plan: Laceration of periurethral tissue with delivery  Postpartum examination following vaginal delivery  Postpartum care and examination of lactating mother  Stitch remnant removed without complication. Continue routine postpartum care.   No orders of the defined types were placed in this encounter.    [name] Severin, CNM 09/24/2022 11:22 AM

## 2022-10-21 ENCOUNTER — Ambulatory Visit (INDEPENDENT_AMBULATORY_CARE_PROVIDER_SITE_OTHER): Payer: Medicaid Other

## 2022-10-21 ENCOUNTER — Other Ambulatory Visit (HOSPITAL_COMMUNITY)
Admission: RE | Admit: 2022-10-21 | Discharge: 2022-10-21 | Disposition: A | Discharge status: Home / Self Care | Payer: Medicaid Other | Source: Ambulatory Visit

## 2022-10-21 VITALS — BP 136/61 | HR 76 | Ht 63.0 in | Wt 207.0 lb

## 2022-10-21 DIAGNOSIS — Z01419 Encounter for gynecological examination (general) (routine) without abnormal findings: Secondary | ICD-10-CM | POA: Insufficient documentation

## 2022-10-21 DIAGNOSIS — B379 Candidiasis, unspecified: Secondary | ICD-10-CM

## 2022-10-21 DIAGNOSIS — Z124 Encounter for screening for malignant neoplasm of cervix: Secondary | ICD-10-CM | POA: Insufficient documentation

## 2022-10-21 DIAGNOSIS — Z3202 Encounter for pregnancy test, result negative: Secondary | ICD-10-CM

## 2022-10-21 DIAGNOSIS — Z30430 Encounter for insertion of intrauterine contraceptive device: Secondary | ICD-10-CM

## 2022-10-21 LAB — POCT URINE PREGNANCY: Preg Test, Ur: NEGATIVE

## 2022-10-21 MED ORDER — FLUCONAZOLE 150 MG PO TABS
150.0000 mg | ORAL_TABLET | Freq: Once | ORAL | 3 refills | Status: AC
Start: 2022-10-21 — End: 2022-10-21

## 2022-10-21 MED ORDER — PARAGARD INTRAUTERINE COPPER IU IUD
1.0000 | INTRAUTERINE_SYSTEM | Freq: Once | INTRAUTERINE | Status: AC
Start: 2022-10-21 — End: 2022-10-21
  Administered 2022-10-21: 1 via INTRAUTERINE

## 2022-10-21 NOTE — Progress Notes (Signed)
   Post Partum Exam  Jody Mooney is a 27 y.o. [phone] female who presents for a postpartum visit. She is 6 weeks postpartum following a spontaneous vaginal delivery. I have fully reviewed the prenatal and intrapartum course. The delivery was at [redacted]w[redacted]d gestational weeks.  Anesthesia: none. Postpartum course has been uncomplicated.   Bleeding  stopped at 4 weeks postpartum . Bowel function is normal. Bladder function is normal.  Patient is sexually active. She had intercourse once approximatley a week ago and they used withdrawal. She desires a Paragard IUD today, see procedure note below.  Postpartum depression screening:neg, EPDS 0, feels her mental health is stable especially since she has been able to start back to her normal exercise routine. Has good support at home and is getting regular sleep.  Baby's course has been complicated by low milk supply. Baby is feeding by  breast with formula supplementation and doing well .   Review of Systems Pertinent items are noted in HPI.    Objective:  Blood pressure 136/61, pulse 76, height 5\' 3"  (1.6 m), weight 207 lb (93.9 kg), currently breastfeeding.  General:  alert and no distress   Breasts:  deferred  Lungs: clear to auscultation bilaterally  Heart:  regular rate and rhythm, S1, S2 normal, no murmur, click, rub or gallop  Abdomen: soft, non-tender; bowel sounds normal; no masses,  no organomegaly   Vulva:  Normal external genitalia, mild erythema with white plaques suggestive of yeast  Vagina: Well rugated, creamy white adherent discharge present  Cervix:  Normal in appearance, negative for lesion, CMT  Corpus: normal size, contour, position, consistency, mobility, non-tender  Adnexa:  normal adnexa and no mass, fullness, tenderness  Rectal Exam: Not performed.        Assessment:    Normal postpartum exam. Pap smear done at today's visit along with testing for BV and yeast.  Plan:   1. Contraception:  Paragard (see note  below) 2. Pap smear collected today. If normal, will be due in three years 3. Rx provided for presumptive yeast infection.  3. Follow up as needed.    ENCOUNTER FOR IUD INSERTION  UPT negative in clinic today.   Procedure Note Consent was obtained prior to the procedure. A bimanual exam was performed to determine the position of the uterus. A sterile speculum was placed in the vagina, and the cervix was visualized. Betadine was applied to the cervix . An [name] was placed on the anterior lip of the cervix, and gentle traction was applied to straighten and stabilize it. The uterus was sounded to about 7 cm. The Paragard IUD was inserted to the appropriate depth and the insertion tool was removed. The strings were trimmed to about 3 cm. Bleeding was minimal. Patient tolerated the procedure well. Post-procedure care and warning signs were reviewed with patient  Follow up for annual visit or PRN.   Lindalou Hose Elizeth Weinrich, CNM  10/20/2409:49 AM

## 2022-10-21 NOTE — Addendum Note (Signed)
Addended by: Autumn Messing on: 10/21/2022 01:25 PM   Modules accepted: Orders

## 2022-10-25 LAB — CYTOLOGY - PAP: Diagnosis: NEGATIVE

## 2022-11-04 NOTE — Progress Notes (Signed)
This encounter was created in error - please disregard.

## 2022-12-13 ENCOUNTER — Ambulatory Visit: Payer: Medicaid Other

## 2022-12-17 ENCOUNTER — Ambulatory Visit: Payer: Medicaid Other | Admitting: Obstetrics

## 2022-12-21 ENCOUNTER — Other Ambulatory Visit (HOSPITAL_COMMUNITY)
Admission: RE | Admit: 2022-12-21 | Discharge: 2022-12-21 | Disposition: A | Discharge status: Home / Self Care | Payer: Medicaid Other | Source: Ambulatory Visit

## 2022-12-21 ENCOUNTER — Ambulatory Visit (INDEPENDENT_AMBULATORY_CARE_PROVIDER_SITE_OTHER): Payer: Medicaid Other | Admitting: Obstetrics & Gynecology

## 2022-12-21 ENCOUNTER — Encounter: Payer: Self-pay | Admitting: Obstetrics & Gynecology

## 2022-12-21 VITALS — BP 117/65 | HR 52 | Ht 63.0 in | Wt 205.0 lb

## 2022-12-21 DIAGNOSIS — Z862 Personal history of diseases of the blood and blood-forming organs and certain disorders involving the immune mechanism: Secondary | ICD-10-CM | POA: Diagnosis not present

## 2022-12-21 DIAGNOSIS — Z30430 Encounter for insertion of intrauterine contraceptive device: Secondary | ICD-10-CM

## 2022-12-21 DIAGNOSIS — Z30433 Encounter for removal and reinsertion of intrauterine contraceptive device: Secondary | ICD-10-CM

## 2022-12-21 DIAGNOSIS — L659 Nonscarring hair loss, unspecified: Secondary | ICD-10-CM | POA: Diagnosis not present

## 2022-12-21 DIAGNOSIS — N898 Other specified noninflammatory disorders of vagina: Secondary | ICD-10-CM

## 2022-12-21 DIAGNOSIS — T8332XA Displacement of intrauterine contraceptive device, initial encounter: Secondary | ICD-10-CM

## 2022-12-21 DIAGNOSIS — Z30432 Encounter for removal of intrauterine contraceptive device: Secondary | ICD-10-CM

## 2022-12-21 MED ORDER — LEVONORGESTREL 20 MCG/DAY IU IUD
1.0000 | INTRAUTERINE_SYSTEM | Freq: Once | INTRAUTERINE | Status: AC
Start: 2022-12-21 — End: 2022-12-21
  Administered 2022-12-21: 1 via INTRAUTERINE

## 2022-12-21 NOTE — Addendum Note (Signed)
Addended by: Cornelius Moras D on: 12/21/2022 11:37 AM   Modules accepted: Orders

## 2022-12-21 NOTE — Progress Notes (Signed)
    GYNECOLOGY PROGRESS NOTE  Subjective:    Patient ID: Jody Mooney, female    DOB: 11-12-1995, 27 y.o.   MRN: [id number]  HPI  Patient is a 27 y.o. [id number] here because she thinks that her Paragard, placed at her postpartum visit about 6 weeks ago is causing her to lose hair. She also reports a heavy period since it was placed.  The following portions of the patient's history were reviewed and updated as appropriate: allergies, current medications, past family history, past medical history, past social history, past surgical history, and problem list.  Review of Systems Pertinent items are noted in HPI.  Pap smear normal 2024   Objective:   Blood pressure 117/65, pulse (!) 52, height 5\' 3"  (1.6 m), weight 205 lb (93 kg), last menstrual period 12/06/2022, currently breastfeeding. Body mass index is 36.31 kg/m. Well nourished, well hydrated Black female, no apparent distress She is ambulating and conversing normally. Spec exam showed the distal end of the Paragard at the cervical os Frothy vaginal discharge c/w her h/o BV   Consent signed, Time out procedure done. Bimanual exam revealed a uterus NSSA, no adnexal masses or tenderness Cervix prepped with betadine and Hurricaine spray and then grasped with a single tooth tenaculum. Mirena was easily placed and the strings were cut to 3-4 cm. Uterus sounded to 10 cm. She tolerated the procedure well.   Assessment:   1. Hair loss     2. Contraception - Mirena placed since Paragard was malpositioned - string check in 4 weeks  3. BV- recurrent - treat with flagyl and rec boric acid supp for prevention - Aptima sent   4. Heavy period  She has used Mirena for several years in the past and did not have heavy periods during that time frame.  Plan:   1. Hair loss  - TSH + free T4    2. H/O anemia during pregnancy - check CBC

## 2022-12-22 LAB — CERVICOVAGINAL ANCILLARY ONLY
Bacterial Vaginitis (gardnerella): POSITIVE — AB
Candida Glabrata: NEGATIVE
Candida Vaginitis: POSITIVE — AB
Comment: NEGATIVE
Comment: NEGATIVE
Comment: NEGATIVE
Comment: NEGATIVE
Trichomonas: NEGATIVE

## 2022-12-22 LAB — CBC
Hematocrit: 35.1 % (ref 34.0–46.6)
Hemoglobin: 11.1 g/dL (ref 11.1–15.9)
MCH: 24.7 pg — ABNORMAL LOW (ref 26.6–33.0)
MCHC: 31.6 g/dL (ref 31.5–35.7)
MCV: 78 fL — ABNORMAL LOW (ref 79–97)
Platelets: 323 10*3/uL (ref 150–450)
RBC: 4.49 x10E6/uL (ref 3.77–5.28)
RDW: 13.2 % (ref 11.7–15.4)
WBC: 5.8 10*3/uL (ref 3.4–10.8)

## 2022-12-22 LAB — TSH+FREE T4
Free T4: 0.98 ng/dL (ref 0.82–1.77)
TSH: 1.24 u[IU]/mL (ref 0.450–4.500)

## 2022-12-27 ENCOUNTER — Other Ambulatory Visit: Payer: Self-pay | Admitting: Obstetrics & Gynecology

## 2022-12-27 ENCOUNTER — Encounter: Payer: Self-pay | Admitting: Obstetrics & Gynecology

## 2022-12-27 DIAGNOSIS — B379 Candidiasis, unspecified: Secondary | ICD-10-CM

## 2022-12-27 MED ORDER — FLUCONAZOLE 150 MG PO TABS: 150.0000 mg | ORAL_TABLET | Freq: Once | ORAL | 0 refills | Status: AC

## 2022-12-27 NOTE — Progress Notes (Signed)
Diflucan prescribed

## 2023-01-18 ENCOUNTER — Ambulatory Visit (INDEPENDENT_AMBULATORY_CARE_PROVIDER_SITE_OTHER): Payer: Medicaid Other | Admitting: Obstetrics & Gynecology

## 2023-01-18 ENCOUNTER — Encounter: Payer: Self-pay | Admitting: Obstetrics & Gynecology

## 2023-01-18 VITALS — BP 114/77 | HR 83 | Ht 63.0 in | Wt 207.0 lb

## 2023-01-18 DIAGNOSIS — F419 Anxiety disorder, unspecified: Secondary | ICD-10-CM

## 2023-01-18 DIAGNOSIS — Z30432 Encounter for removal of intrauterine contraceptive device: Secondary | ICD-10-CM | POA: Diagnosis not present

## 2023-01-18 NOTE — Progress Notes (Signed)
    GYNECOLOGY PROGRESS NOTE  Subjective:    Patient ID: Jody Mooney, female    DOB: 05-03-1995, 27 y.o.   MRN: [id number]  HPI  Patient is a 27 y.o. single [id number] here today because she would like her 23 month old IUD removed.  I saw her last month when her Paragard was found to be malpositioned. She had used Mirena in the past and opted to re start this method.  At that visit she was concerned about hair loss. I reassured her and happily, her hair has returned to normal.  She reports that she wants the IUD removed. She wants to use condoms. She states that she does not want another pregnancy but that she is feeling very anxious and blames the Mirena for this issue. She denies HI and SI.  She reports that she feels almost like she is having "panic attacks". TSH was normal last month.  The following portions of the patient's history were reviewed and updated as appropriate: allergies, current medications, past family history, past medical history, past social history, past surgical history, and problem list.  Review of Systems Pertinent items are noted in HPI.  Pap normal 09/2022  Objective:   Blood pressure 114/77, pulse 83, height 5\' 3"  (1.6 m), weight 207 lb (93.9 kg), last menstrual period 01/10/2023, currently breastfeeding. Body mass index is 36.67 kg/m. Well nourished, well hydrated Black female, no apparent distress She is ambulating and conversing normally. Intact Mirena removed easily  Assessment:   1. Anxiety     2.      Contraception- she plans to use condoms, aware of the 80% efficacy   Plan:   1. Anxiety  - Ambulatory referral to Psychology

## 2023-02-14 ENCOUNTER — Encounter: Payer: Self-pay | Admitting: Obstetrics and Gynecology

## 2023-02-14 DIAGNOSIS — N898 Other specified noninflammatory disorders of vagina: Secondary | ICD-10-CM

## 2023-02-14 MED ORDER — FLUCONAZOLE 150 MG PO TABS
150.0000 mg | ORAL_TABLET | Freq: Once | ORAL | 1 refills | Status: AC
Start: 2023-02-14 — End: 2023-02-14

## 2023-02-14 NOTE — Telephone Encounter (Signed)
Spoke with patient. She advised she is having white discharge. Denies odor. She states this seems to recurrent since giving birth in July. Advised will send rx for Diflucan as patient has +Swab result from 11/2022. Advised to schedule appointment for evaluation with a provider if problem reoccurs or persists on day nine.

## 2023-04-18 ENCOUNTER — Telehealth: Payer: Self-pay

## 2023-04-18 DIAGNOSIS — N898 Other specified noninflammatory disorders of vagina: Secondary | ICD-10-CM

## 2023-04-18 NOTE — Progress Notes (Signed)
 ..  Patient declines further follow up and engagement by the Managed Medicaid Team. Appropriate care team members and provider have been notified via electronic communication. The Managed Medicaid Team is available to follow up with the patient after provider conversation with the patient regarding recommendation for engagement and subsequent re-referral to the Managed Medicaid Team.     Weston Settle [facility], [facility] Guide Direct Dial: [phone]  Fax: [phone]

## 2023-05-13 ENCOUNTER — Ambulatory Visit (INDEPENDENT_AMBULATORY_CARE_PROVIDER_SITE_OTHER): Payer: Medicaid Other | Admitting: Certified Nurse Midwife

## 2023-05-13 ENCOUNTER — Encounter: Payer: Self-pay | Admitting: Certified Nurse Midwife

## 2023-05-13 VITALS — BP 121/75 | HR 68 | Ht 63.0 in | Wt 201.5 lb

## 2023-05-13 DIAGNOSIS — Z1329 Encounter for screening for other suspected endocrine disorder: Secondary | ICD-10-CM

## 2023-05-13 DIAGNOSIS — E669 Obesity, unspecified: Secondary | ICD-10-CM | POA: Insufficient documentation

## 2023-05-13 DIAGNOSIS — Z13220 Encounter for screening for lipoid disorders: Secondary | ICD-10-CM

## 2023-05-13 DIAGNOSIS — Z01419 Encounter for gynecological examination (general) (routine) without abnormal findings: Secondary | ICD-10-CM | POA: Diagnosis not present

## 2023-05-13 DIAGNOSIS — Z130 Encounter for screening for diseases of the blood and blood-forming organs and certain disorders involving the immune mechanism: Secondary | ICD-10-CM | POA: Diagnosis not present

## 2023-05-13 NOTE — Progress Notes (Signed)
 GYNECOLOGY ANNUAL PREVENTATIVE CARE ENCOUNTER NOTE  History:     Jody Mooney is a 28 y.o. [phone] female here for a routine annual gynecologic exam.  Current complaints: concerns of change of breast tissue since birth of child.Denies abnormal vaginal bleeding, discharge, pelvic pain, problems with intercourse or other gynecologic concerns.     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  Gynecologic History Patient's last menstrual period was 05/11/2023 (exact date). Contraception: condoms  Last Pap: 10/21/2022. Results were: normal    Obstetric History OB History  Gravida Para Term Preterm AB Living  4 3 3  1 3   SAB IAB Ectopic Multiple Live Births  1   0 3    # Outcome Date GA Lbr Len/2nd Weight Sex Type Anes PTL Lv  4 Term 09/06/22 [redacted]w[redacted]d  7 lb 1.2 oz (3.21 kg) M Vag-Spont Local  LIV  3 SAB 10/2021     SAB     2 Term 03/20/21 [redacted]w[redacted]d / 00:17 7 lb 3.7 oz (3.28 kg) F Vag-Spont None  LIV  1 Term 03/04/18 [redacted]w[redacted]d 07:26 / 00:12 6 lb 9.6 oz (2.994 kg) F Vag-Spont None  LIV    Past Medical History:  Diagnosis Date   Asthma     Past Surgical History:  Procedure Laterality Date   CYSTOSCOPY N/A 05/15/2019   Procedure: CYSTOSCOPY;  Surgeon: Natale Milch, MD;  Location: [facility] ORS;  Service: Gynecology;  Laterality: N/A;   LAPAROSCOPY  05/15/2019   Procedure: LAPAROSCOPY DIAGNOSTIC WITH RIGHT OOPHORECTOMY AND LEFT OVARIAN CYSTECTOMY;  Surgeon: Natale Milch, MD;  Location: [facility] ORS;  Service: Gynecology;;   WISDOM TOOTH EXTRACTION      Current Outpatient Medications on File Prior to Visit  Medication Sig Dispense Refill   acetaminophen (TYLENOL) 325 MG tablet Take 3 tablets (975 mg total) by mouth every 8 (eight) hours as needed (for pain scale < 4).     benzocaine-Menthol (DERMOPLAST) 20-0.5 %  AERO Apply 1 Application topically as needed for irritation (perineal discomfort). (Patient not taking: Reported on 10/21/2022)     docusate sodium (COLACE) 100 MG capsule Take 1 capsule (100 mg total) by mouth 2 (two) times daily as needed for mild constipation. (Patient not taking: Reported on 10/21/2022) 10 capsule 0   ibuprofen (ADVIL) 600 MG tablet Take 1 tablet (600 mg total) by mouth every 6 (six) hours. (Patient not taking: Reported on 10/21/2022) 30 tablet 0   levonorgestrel (MIRENA) 20 MCG/DAY IUD 1 each by Intrauterine route once. (Patient not taking: Reported on 01/18/2023)     Prenat-FeCbn-FeAsp-Meth-FA-DHA (PRENATE MINI) 18-0.6-0.4-350 MG CAPS Take 1 each by mouth daily. (Patient not taking: Reported on 10/21/2022) 30 capsule 11   witch hazel-glycerin (TUCKS) pad Apply topically as needed for itching. 40 each 12   No current facility-administered medications on file prior to visit.    Allergies  Allergen Reactions   Vancomycin Itching and Other (See Comments)    Red man syndrome   Penicillins Rash    Has patient had a PCN reaction causing immediate rash, facial/tongue/throat swelling, SOB or lightheadedness with hypotension: Unknown Has patient had a PCN reaction causing severe rash involving mucus membranes or skin necrosis: Unknown Has patient had a PCN reaction that required hospitalization: Unknown Has patient had a PCN reaction occurring within the last 10 years: no  If all of the above answers are "NO", then may proceed with Cephalosporin use.     Social History:  reports that she has never smoked. She has never used smokeless tobacco. She reports that she does not drink alcohol and does not use drugs.  Family History  Problem Relation Age of Onset   Anxiety disorder Mother    Hyperlipidemia Mother    Healthy Father    Healthy Sister    Healthy Brother    Healthy Brother    Anxiety disorder Maternal Grandmother    Diabetes Maternal Grandmother    Diabetes Maternal  Grandfather    Anxiety disorder Other        Great Grandmother   Depression Other        Grandmother and mother   Hyperlipidemia Other    Diabetes type II Other        Grand parents   Other Other        VSD repaired in grandmother age 41    The following portions of the patient's history were reviewed and updated as appropriate: allergies, current medications, past family history, past medical history, past social history, past surgical history and problem list.  Review of Systems Pertinent items noted in HPI and remainder of comprehensive ROS otherwise negative.  Physical Exam:  BP 121/75   Pulse 68   Ht 5\' 3"  (1.6 m)   Wt 201 lb 8 oz (91.4 kg)   LMP 05/11/2023 (Exact Date)   Breastfeeding No   BMI 35.69 kg/m  CONSTITUTIONAL: Well-developed, well-nourished female in no acute distress.  HENT:  Normocephalic, atraumatic, External right and left ear normal. Oropharynx is clear and moist EYES: Conjunctivae and EOM are normal. Pupils are equal, round, and reactive to light. No scleral icterus.  NECK: Normal range of motion, supple, no masses.  Normal thyroid.  SKIN: Skin is warm and dry. No rash noted. Not diaphoretic. No erythema. No pallor. MUSCULOSKELETAL: Normal range of motion. No tenderness.  No cyanosis, clubbing, or edema.  2+ distal pulses. NEUROLOGIC: Alert and oriented to person, place, and time. Normal reflexes, muscle tone coordination.  PSYCHIATRIC: Normal mood and affect. Normal behavior. Normal judgment and thought content. CARDIOVASCULAR: Normal heart rate noted, regular rhythm RESPIRATORY: Clear to auscultation bilaterally. Effort and breath sounds normal, no problems with respiration noted. BREASTS: Symmetric in size. No masses, tenderness, skin changes, nipple drainage, or lymphadenopathy bilaterally. Lactating  ABDOMEN: Soft, no distention noted.  No tenderness, rebound or guarding.  PELVIC: Normal appearing external genitalia and urethral meatus; normal  appearing vaginal mucosa and cervix.  No abnormal discharge noted.  Pap smear not due.  Normal uterine size, no other palpable masses, no uterine or adnexal tenderness.  .   Assessment and Plan:  Annual Well Women GYN Exam   Pap: not due  Labs: Tsh & free T4, Cbc, cmp, Hem A1c, Lipid  Refills: none  Referral: none  Routine preventative health maintenance measures emphasized. Please refer to After Visit Summary for other counseling recommendations.      Doreene Burke, CNM [facility]  [facility],  [facility]

## 2023-05-13 NOTE — Patient Instructions (Signed)

## 2023-05-14 LAB — COMPREHENSIVE METABOLIC PANEL
ALT: 18 IU/L (ref 0–32)
AST: 17 IU/L (ref 0–40)
Albumin: 4.2 g/dL (ref 4.0–5.0)
Alkaline Phosphatase: 82 IU/L (ref 44–121)
BUN/Creatinine Ratio: 11 (ref 9–23)
BUN: 9 mg/dL (ref 6–20)
Bilirubin Total: 0.3 mg/dL (ref 0.0–1.2)
CO2: 24 mmol/L (ref 20–29)
Calcium: 9.2 mg/dL (ref 8.7–10.2)
Chloride: 103 mmol/L (ref 96–106)
Creatinine, Ser: 0.8 mg/dL (ref 0.57–1.00)
Globulin, Total: 2.6 g/dL (ref 1.5–4.5)
Glucose: 76 mg/dL (ref 70–99)
Potassium: 4.4 mmol/L (ref 3.5–5.2)
Sodium: 139 mmol/L (ref 134–144)
Total Protein: 6.8 g/dL (ref 6.0–8.5)
eGFR: 103 mL/min/{1.73_m2} (ref >59)

## 2023-05-14 LAB — CBC
Hematocrit: 34.8 % (ref 34.0–46.6)
Hemoglobin: 11.1 g/dL (ref 11.1–15.9)
MCH: 25.4 pg — ABNORMAL LOW (ref 26.6–33.0)
MCHC: 31.9 g/dL (ref 31.5–35.7)
MCV: 80 fL (ref 79–97)
Platelets: 283 10*3/uL (ref 150–450)
RBC: 4.37 x10E6/uL (ref 3.77–5.28)
RDW: 13.8 % (ref 11.7–15.4)
WBC: 6 10*3/uL (ref 3.4–10.8)

## 2023-05-14 LAB — LIPID PANEL
Chol/HDL Ratio: 3.3 ratio (ref 0.0–4.4)
Cholesterol, Total: 112 mg/dL (ref 100–199)
HDL: 34 mg/dL — ABNORMAL LOW (ref >39)
LDL Chol Calc (NIH): 56 mg/dL (ref 0–99)
Triglycerides: 118 mg/dL (ref 0–149)
VLDL Cholesterol Cal: 22 mg/dL (ref 5–40)

## 2023-05-14 LAB — HEMOGLOBIN A1C
Est. average glucose Bld gHb Est-mCnc: 105 mg/dL
Hgb A1c MFr Bld: 5.3 % (ref 4.8–5.6)

## 2023-05-14 LAB — TSH+FREE T4
Free T4: 1.08 ng/dL (ref 0.82–1.77)
TSH: 0.915 u[IU]/mL (ref 0.450–4.500)

## 2023-06-27 DIAGNOSIS — X500XXA Overexertion from strenuous movement or load, initial encounter: Secondary | ICD-10-CM | POA: Diagnosis not present

## 2023-06-27 DIAGNOSIS — S86911A Strain of unspecified muscle(s) and tendon(s) at lower leg level, right leg, initial encounter: Secondary | ICD-10-CM | POA: Diagnosis not present

## 2023-07-01 DIAGNOSIS — M222X1 Patellofemoral disorders, right knee: Secondary | ICD-10-CM | POA: Insufficient documentation

## 2023-10-20 ENCOUNTER — Ambulatory Visit (INDEPENDENT_AMBULATORY_CARE_PROVIDER_SITE_OTHER)

## 2023-10-20 VITALS — BP 124/81 | HR 66 | Ht 63.0 in | Wt 210.0 lb

## 2023-10-20 DIAGNOSIS — Z3201 Encounter for pregnancy test, result positive: Secondary | ICD-10-CM

## 2023-10-20 DIAGNOSIS — Z3687 Encounter for antenatal screening for uncertain dates: Secondary | ICD-10-CM

## 2023-10-20 DIAGNOSIS — Z3200 Encounter for pregnancy test, result unknown: Secondary | ICD-10-CM

## 2023-10-20 NOTE — Progress Notes (Addendum)
    NURSE VISIT NOTE  Subjective:    Patient ID: Jody Mooney, female    DOB: 17-Oct-1995, 28 y.o.   MRN: [id number]  HPI  Patient is a 28 y.o. [id number] female who presents for evaluation of amenorrhea. She believes she could be pregnant. She is unsure how she feels. Current symptoms also include: breast tenderness, fatigue, nausea, and positive home pregnancy test. Last period was normal.    Objective:    BP 124/81   Pulse 66   Ht 5' 3 (1.6 m)   Wt 210 lb (95.3 kg)   LMP 09/13/2023   Breastfeeding No   BMI 37.20 kg/m   Lab Review  Results for orders placed or performed in visit on 10/20/23  POCT urine pregnancy  Result Value Ref Range   Preg Test, Ur Positive (A) Negative    Assessment:   1. Encounter for antenatal screening for uncertain dates   2. Possible pregnancy, not confirmed     Plan:   Pregnancy Test: Positive  Estimated Date of Delivery: 06/19/24 BP Cuff Measurement taken. Cuff Size Adult Small   Jody Mooney, CMA

## 2023-10-20 NOTE — Addendum Note (Signed)
 Addended by: TAMEA ANNALEE DEL on: 10/20/2023 09:38 AM   Modules accepted: Level of Service

## 2023-10-27 ENCOUNTER — Encounter: Payer: Self-pay | Admitting: Certified Nurse Midwife

## 2023-11-02 LAB — POCT URINE PREGNANCY: Preg Test, Ur: POSITIVE — AB

## 2023-11-02 NOTE — Addendum Note (Signed)
 Addended by: TAMEA ANNALEE DEL on: 11/02/2023 08:51 AM   Modules accepted: Orders

## 2023-11-04 ENCOUNTER — Other Ambulatory Visit: Payer: Self-pay

## 2023-11-04 ENCOUNTER — Telehealth (INDEPENDENT_AMBULATORY_CARE_PROVIDER_SITE_OTHER)

## 2023-11-04 DIAGNOSIS — Z3689 Encounter for other specified antenatal screening: Secondary | ICD-10-CM

## 2023-11-04 DIAGNOSIS — Z3480 Encounter for supervision of other normal pregnancy, unspecified trimester: Secondary | ICD-10-CM | POA: Insufficient documentation

## 2023-11-04 MED ORDER — PRENATE MINI 18-0.6-0.4-350 MG PO CAPS: 1.0000 | ORAL_CAPSULE | Freq: Every day | ORAL | 9 refills | Status: DC

## 2023-11-04 NOTE — Progress Notes (Signed)
 New OB Intake  I connected with  Jody Mooney on 11/04/23 at  9:15 AM EDT by MyChart Video Visit and verified that I am speaking with the correct person using two identifiers. Nurse is located at [facility] and pt is located at home.  I discussed the limitations, risks, security and privacy concerns of performing an evaluation and management service by telephone and the availability of in person appointments. I also discussed with the patient that there may be a patient responsible charge related to this service. The patient expressed understanding and agreed to proceed.  I explained I am completing New OB Intake today. We discussed her EDD of 06/19/24 that is based on LMP of 09/13/23. Pt is G5/P3. I reviewed her allergies, medications, Medical/Surgical/OB history, and appropriate screenings. There are cats in the home: no. Based on history, this is a/an pregnancy uncomplicated . Her obstetrical history is significant for N/A.  Patient Active Problem List   Diagnosis Date Noted   Supervision of other normal pregnancy, antepartum 11/04/2023   Patellofemoral syndrome of right knee 07/01/2023   Obesity (BMI 30-39.9) 05/13/2023    Concerns addressed today: None  Delivery Plans:  Plans to deliver at [facility].  Anatomy US  Explained first scheduled US  will be 11/24/23. Anatomy US  will be scheduled around [redacted] weeks gestational age.  Labs Discussed genetic screening with patient. Patient desires genetic testing to be drawn at new OB visit. Discussed possible labs to be drawn at new OB appointment.  COVID Vaccine Patient has not had COVID vaccine.   Social Determinants of Health Food Insecurity: denies food insecurity  Transportation: Patient denies transportation needs. Childcare: Discussed no children allowed at ultrasound appointments.   First visit review I reviewed new OB appt with pt. I explained she will have blood work and pap smear/pelvic exam if indicated.  Explained pt will be seen by Eleanor Canny, CNM at first visit; encounter routed to appropriate provider.   Beola Skeens, CMA 11/04/2023  9:38 AM

## 2023-11-04 NOTE — Patient Instructions (Signed)
 First Trimester of Pregnancy  The first trimester of pregnancy starts on the first day of your last monthly period until the end of week 13. This is months 1 through 3 of pregnancy. A week after a sperm fertilizes an egg, the egg will implant into the wall of the uterus and begin to develop into a baby. Body changes during your first trimester Your body goes through many changes during pregnancy. The changes usually return to normal after your baby is born. Physical changes Your breasts may grow larger and may hurt. The area around your nipples may get darker. Your periods will stop. Your hair and nails may grow faster. You may pee more often. Health changes You may tire easily. Your gums may bleed and may be sensitive when you brush and floss. You may not feel hungry. You may have heartburn. You may throw up or feel like you may throw up. You may want to eat some foods, but not others. You may have headaches. You may have trouble pooping (constipation). Other changes Your emotions may change from day to day. You may have more dreams. Follow these instructions at home: Medicines Talk to your health care provider if you're taking medicines. Ask if the medicines are safe to take during pregnancy. Your provider may change the medicines that you take. Do not take any medicines unless told to by your provider. Take a prenatal vitamin that has at least 600 micrograms (mcg) of folic acid. Do not use herbal medicines, illegal substances, or medicines that are not approved by your provider. Eating and drinking While you're pregnant your body needs extra food for your growing baby. Talk with your provider about what to eat while pregnant. Activity Most women are able to exercise during pregnancy. Exercises may need to change as your pregnancy goes on. Talk to your provider about your activities and exercise routines. Relieving pain and discomfort Wear a good, supportive bra if your breasts  hurt. Rest with your legs raised if you have leg cramps or low back pain. Safety Wear your seatbelt at all times when you're in a car. Talk to your provider if someone hits you, hurts you, or yells at you. Talk with your provider if you're feeling sad or have thoughts of hurting yourself. Lifestyle Certain things can be harmful while you're pregnant. Follow these rules: Do not use hot tubs, steam rooms, or saunas. Do not douche. Do not use tampons or scented pads. Do not drink alcohol,smoke, vape, or use products with nicotine or tobacco in them. If you need help quitting, talk with your provider. Avoid cat litter boxes and soil used by cats. These things carry germs that can cause harm to your pregnancy and your baby. General instructions Keep all follow-up visits. It helps you and your unborn baby stay as healthy as possible. Write down your questions. Take them to your visits. Your provider will: Talk with you about your overall health. Give you advice or refer you to specialists who can help with different needs, including: Prenatal education classes. Mental health and counseling. Foods and healthy eating. Ask for help if you need help with food. Call your dentist and ask to be seen. Brush your teeth with a soft toothbrush. Floss gently. Where to find more information American Pregnancy Association: americanpregnancy.org [employer]: acog.org Office on [employer] Health: [employer] Contact a health care provider if: You feel dizzy, faint, or have a fever. You vomit or have watery poop (diarrhea) for 2  days or more. You have abnormal discharge or bleeding from your vagina. You have pain when you pee or your pee smells bad. You have cramps, pain, or pressure in your belly area. Get help right away if: You have trouble breathing or chest pain. You have any kind of injury, such as from a fall or a car crash. These symptoms may be an  emergency. Get help right away. Call 911. Do not wait to see if the symptoms will go away. Do not drive yourself to the hospital. This information is not intended to replace advice given to you by your health care provider. Make sure you discuss any questions you have with your health care provider. Document Revised: 11/18/2022 Document Reviewed: 06/18/2022 Elsevier Patient Education  2024 Elsevier Inc.   Common Medications Safe in Pregnancy  Acne:      Constipation:  Benzoyl Peroxide     Colace  Clindamycin      Dulcolax Suppository  Topica Erythromycin     Fibercon  Salicylic Acid      Metamucil         Miralax AVOID:        Senakot   Accutane    Cough:  Retin-A       Cough Drops  Tetracycline      Phenergan w/ Codeine if Rx  Minocycline      Robitussin (Plain & DM)  Antibiotics:     Crabs/Lice:  Ceclor       RID  Cephalosporins    AVOID:  E-Mycins      Kwell  Keflex  Macrobid/Macrodantin   Diarrhea:  Penicillin      Kao-Pectate  Zithromax      Imodium AD         PUSH FLUIDS AVOID:       Cipro     Fever:  Tetracycline      Tylenol (Regular or Extra  Minocycline       Strength)  Levaquin      Extra Strength-Do not          Exceed 8 tabs/24 hrs Caffeine:        200mg /day (equiv. To 1 cup of coffee or  approx. 3 12 oz sodas)         Gas: Cold/Hayfever:       Gas-X  Benadryl      Mylicon  Claritin       Phazyme  **Claritin-D        Chlor-Trimeton    Headaches:  Dimetapp      ASA-Free Excedrin  Drixoral-Non-Drowsy     Cold Compress  Mucinex (Guaifenasin)     Tylenol (Regular or Extra  Sudafed/Sudafed-12 Hour     Strength)  **Sudafed PE Pseudoephedrine   Tylenol Cold & Sinus     Vicks Vapor Rub  Zyrtec  **AVOID if Problems With Blood Pressure         Heartburn: Avoid lying down for at least 1 hour after meals  Aciphex      Maalox     Rash:  Milk of Magnesia     Benadryl    Mylanta       1% Hydrocortisone Cream  Pepcid  Pepcid Complete   Sleep  Aids:  Prevacid      Ambien   Prilosec       Benadryl  Rolaids       Chamomile Tea  Tums (Limit 4/day)     Unisom  Tylenol PM         Warm milk-add vanilla or  Hemorrhoids:       Sugar for taste  Anusol/Anusol H.C.  (RX: Analapram 2.5%)  Sugar Substitutes:  Hydrocortisone OTC     Ok in moderation  Preparation H      Tucks        Vaseline lotion applied to tissue with wiping    Herpes:     Throat:  Acyclovir      Oragel  Famvir  Valtrex     Vaccines:         Flu Shot Leg Cramps:       *Gardasil  Benadryl      Hepatitis A         Hepatitis B Nasal Spray:       Pneumovax  Saline Nasal Spray     Polio Booster         Tetanus Nausea:       Tuberculosis test or PPD  Vitamin B6 25 mg TID   AVOID:    Dramamine      *Gardasil  Emetrol       Live Poliovirus  Ginger Root 250 mg QID    MMR (measles, mumps &  High Complex Carbs @ Bedtime    rebella)  Sea Bands-Accupressure    Varicella (Chickenpox)  Unisom 1/2 tab TID     *No known complications           If received before Pain:         Known pregnancy;   Darvocet       Resume series after  Lortab        Delivery  Percocet    Yeast:   Tramadol      Femstat  Tylenol 3      Gyne-lotrimin  Ultram       Monistat  Vicodin           MISC:         All Sunscreens           Hair Coloring/highlights          Insect Repellant's          (Including DEET)         Mystic Tans   Commonly Asked Questions During Pregnancy   Cats: A parasite can be excreted in cat feces.  To avoid exposure you need to have another person empty the little box.  If you must empty the litter box you will need to wear gloves.  Wash your hands after handling your cat.  This parasite can also be found in raw or undercooked meat so this should also be avoided.  Colds, Sore Throats, Flu: Please check your medication sheet to see what you can take for symptoms.  If your symptoms are unrelieved by these medications please call the office.  Dental Work: Most  any dental work Agricultural consultant recommends is permitted.  X-rays should only be taken during the first trimester if absolutely necessary.  Your abdomen should be shielded with a lead apron during all x-rays.  Please notify your provider prior to receiving any x-rays.  Novocaine is fine; gas is not recommended.  If your dentist requires a note from [place] prior to dental work please call the office and we will provide one for you.  Exercise: Exercise is an important part of staying healthy during your pregnancy.  You may continue most exercises you were accustomed to prior to pregnancy.  Later in your pregnancy you will most likely notice you have difficulty with activities requiring balance like riding a bicycle.  It is important that you listen to your body and avoid activities that put you at a higher risk of falling.  Adequate rest and staying well hydrated are a must!  If you have questions about the safety of specific activities ask your provider.    Exposure to Children with illness: Try to avoid obvious exposure; report any symptoms to [place] when noted,  If you have chicken pos, red measles or mumps, you should be immune to these diseases.   Please do not take any vaccines while pregnant unless you have checked with your OB provider.  Fetal Movement: After 28 weeks we recommend you do "kick counts" twice daily.  Lie or sit down in a calm quiet environment and count your baby movements "kicks".  You should feel your baby at least 10 times per hour.  If you have not felt 10 kicks within the first hour get up, walk around and have something sweet to eat or drink then repeat for an additional hour.  If count remains less than 10 per hour notify your provider.  Fumigating: Follow your pest control agent's advice as to how long to stay out of your home.  Ventilate the area well before re-entering.  Hemorrhoids:   Most over-the-counter preparations can be used during pregnancy.  Check your medication to see what is  safe to use.  It is important to use a stool softener or fiber in your diet and to drink lots of liquids.  If hemorrhoids seem to be getting worse please call the office.   Hot Tubs:  Hot tubs Jacuzzis and saunas are not recommended while pregnant.  These increase your internal body temperature and should be avoided.  Intercourse:  Sexual intercourse is safe during pregnancy as long as you are comfortable, unless otherwise advised by your provider.  Spotting may occur after intercourse; report any bright red bleeding that is heavier than spotting.  Labor:  If you know that you are in labor, please go to the hospital.  If you are unsure, please call the office and let us help you decide what to do.  Lifting, straining, etc:  If your job requires heavy lifting or straining please check with your provider for any limitations.  Generally, you should not lift items heavier than that you can lift simply with your hands and arms (no back muscles)  Painting:  Paint fumes do not harm your pregnancy, but may make you ill and should be avoided if possible.  Latex or water based paints have less odor than oils.  Use adequate ventilation while painting.  Permanents & Hair Color:  Chemicals in hair dyes are not recommended as they cause increase hair dryness which can increase hair loss during pregnancy.  " Highlighting" and permanents are allowed.  Dye may be absorbed differently and permanents may not hold as well during pregnancy.  Sunbathing:  Use a sunscreen, as skin burns easily during pregnancy.  Drink plenty of fluids; avoid over heating.  Tanning Beds:  Because their possible side effects are still unknown, tanning beds are not recommended.  Ultrasound Scans:  Routine ultrasounds are performed at approximately 20 weeks.  You will be able to see your baby's general anatomy an if you would like to know the gender this can usually be determined as well.  If it is questionable when you conceived you may  also  receive an ultrasound early in your pregnancy for dating purposes.  Otherwise ultrasound exams are not routinely performed unless there is a medical necessity.  Although you can request a scan we ask that you pay for it when conducted because insurance does not cover " patient request" scans.  Work: If your pregnancy proceeds without complications you may work until your due date, unless your physician or employer advises otherwise.  Round Ligament Pain/Pelvic Discomfort:  Sharp, shooting pains not associated with bleeding are fairly common, usually occurring in the second trimester of pregnancy.  They tend to be worse when standing up or when you remain standing for long periods of time.  These are the result of pressure of certain pelvic ligaments called "round ligaments".  Rest, Tylenol and heat seem to be the most effective relief.  As the womb and fetus grow, they rise out of the pelvis and the discomfort improves.  Please notify the office if your pain seems different than that described.  It may represent a more serious condition.

## 2023-11-04 NOTE — Progress Notes (Signed)
 Patient requested at [facility] intake Prenate Mini  Rx sent. They worked well with her last pregnancy.

## 2023-11-21 ENCOUNTER — Other Ambulatory Visit: Payer: Self-pay

## 2023-11-21 ENCOUNTER — Emergency Department
Admission: EM | Admit: 2023-11-21 | Discharge: 2023-11-21 | Disposition: A | Discharge status: Home / Self Care | Attending: Emergency Medicine | Admitting: Emergency Medicine

## 2023-11-21 ENCOUNTER — Emergency Department

## 2023-11-21 DIAGNOSIS — R1032 Left lower quadrant pain: Secondary | ICD-10-CM

## 2023-11-21 DIAGNOSIS — Z3A10 10 weeks gestation of pregnancy: Secondary | ICD-10-CM | POA: Diagnosis not present

## 2023-11-21 DIAGNOSIS — O23591 Infection of other part of genital tract in pregnancy, first trimester: Secondary | ICD-10-CM | POA: Insufficient documentation

## 2023-11-21 DIAGNOSIS — N83202 Unspecified ovarian cyst, left side: Secondary | ICD-10-CM | POA: Diagnosis not present

## 2023-11-21 DIAGNOSIS — B9689 Other specified bacterial agents as the cause of diseases classified elsewhere: Secondary | ICD-10-CM | POA: Insufficient documentation

## 2023-11-21 DIAGNOSIS — O3481 Maternal care for other abnormalities of pelvic organs, first trimester: Secondary | ICD-10-CM | POA: Diagnosis not present

## 2023-11-21 DIAGNOSIS — Z3A01 Less than 8 weeks gestation of pregnancy: Secondary | ICD-10-CM | POA: Diagnosis not present

## 2023-11-21 LAB — CBC
HCT: 38 % (ref 36.0–46.0)
Hemoglobin: 11.9 g/dL — ABNORMAL LOW (ref 12.0–15.0)
MCH: 25.6 pg — ABNORMAL LOW (ref 26.0–34.0)
MCHC: 31.3 g/dL (ref 30.0–36.0)
MCV: 81.9 fL (ref 80.0–100.0)
Platelets: 247 K/uL (ref 150–400)
RBC: 4.64 MIL/uL (ref 3.87–5.11)
RDW: 13.7 % (ref 11.5–15.5)
WBC: 7 K/uL (ref 4.0–10.5)
nRBC: 0 % (ref 0.0–0.2)

## 2023-11-21 LAB — COMPREHENSIVE METABOLIC PANEL WITH GFR
ALT: 11 U/L (ref 0–44)
AST: 16 U/L (ref 15–41)
Albumin: 3.7 g/dL (ref 3.5–5.0)
Alkaline Phosphatase: 54 U/L (ref 38–126)
Anion gap: 9 (ref 5–15)
BUN: 7 mg/dL (ref 6–20)
CO2: 24 mmol/L (ref 22–32)
Calcium: 8.5 mg/dL — ABNORMAL LOW (ref 8.9–10.3)
Chloride: 103 mmol/L (ref 98–111)
Creatinine, Ser: 0.53 mg/dL (ref 0.44–1.00)
GFR, Estimated: >60 mL/min (ref >60)
Glucose, Bld: 94 mg/dL (ref 70–99)
Potassium: 3.9 mmol/L (ref 3.5–5.1)
Sodium: 136 mmol/L (ref 135–145)
Total Bilirubin: 0.6 mg/dL (ref 0.0–1.2)
Total Protein: 6.7 g/dL (ref 6.5–8.1)

## 2023-11-21 LAB — CHLAMYDIA/NGC RT PCR (ARMC ONLY)
Chlamydia Tr: NOT DETECTED
N gonorrhoeae: NOT DETECTED

## 2023-11-21 LAB — URINALYSIS, ROUTINE W REFLEX MICROSCOPIC
Bacteria, UA: NONE SEEN
Bilirubin Urine: NEGATIVE
Glucose, UA: NEGATIVE mg/dL
Hgb urine dipstick: NEGATIVE
Ketones, ur: NEGATIVE mg/dL
Nitrite: NEGATIVE
Protein, ur: NEGATIVE mg/dL
Specific Gravity, Urine: 1.019 (ref 1.005–1.030)
pH: 6 (ref 5.0–8.0)

## 2023-11-21 LAB — HCG, QUANTITATIVE, PREGNANCY: hCG, Beta Chain, Quant, S: 169353 m[IU]/mL — ABNORMAL HIGH (ref <5)

## 2023-11-21 LAB — WET PREP, GENITAL
Clue Cells Wet Prep HPF POC: POSITIVE — AB
Sperm: NONE SEEN
Trich, Wet Prep: NONE SEEN
WBC, Wet Prep HPF POC: >=10 — AB (ref <10)
Yeast Wet Prep HPF POC: NONE SEEN

## 2023-11-21 LAB — ABO/RH: ABO/RH(D): O POS

## 2023-11-21 LAB — LIPASE, BLOOD: Lipase: 30 U/L (ref 11–51)

## 2023-11-21 MED ORDER — METRONIDAZOLE 500 MG PO TABS: 500.0000 mg | ORAL_TABLET | Freq: Two times a day (BID) | ORAL | 0 refills | Status: AC

## 2023-11-21 NOTE — Discharge Instructions (Signed)
 You were seen in the emergency department for abdominal pain.  You had an ultrasound that showed a large cyst to your left ovary that needs followed up as an outpatient with your gynecologist.  You had a normal intrauterine pregnancy with a good fetal heartbeat.  You were diagnosed with bacterial vaginosis.  It is important that you start your antibiotics and take as prescribed.  Follow-up closely with your obstetrician as an outpatient.  Return to the emergency department if your pain worsens.  You can take Tylenol  for pain control.  Stay hydrated and drink plenty of fluids.

## 2023-11-21 NOTE — ED Provider Notes (Signed)
 [facility] Provider Note    Event Date/Time   First MD Initiated Contact with Patient 11/21/23 [phone]     (approximate)   History   Abdominal Pain   HPI  FLONNIE WIERMAN is a 28 y.o. female G5, P3 with 1 prior miscarriage, who presents to the emergency department with abdominal pain.  Endorses left upper abdominal pain and left-sided abdominal pain for the past couple of days.  Denies association with nausea, vomiting, diarrhea or constipation.  Approximately [redacted] weeks gestational age.  1 episode of vaginal bleeding approximately 2 weeks ago.  Has not yet had a formal ultrasound done this pregnancy.  States that she was concerned given her abdominal pain and that she could be having a miscarriage.  Denies any history of ectopic pregnancy.  Denies fever or chills.  Prior oophorectomy.  No dysuria, urinary urgency or frequency.     Physical Exam   Triage Vital Signs: ED Triage Vitals  Encounter Vitals Group     BP 11/21/23 0915 128/72     Girls Systolic BP Percentile --      Girls Diastolic BP Percentile --      Boys Systolic BP Percentile --      Boys Diastolic BP Percentile --      Pulse Rate 11/21/23 0915 76     Resp 11/21/23 0915 18     Temp 11/21/23 0915 98 F (36.7 C)     Temp src --      SpO2 11/21/23 0915 100 %     Weight 11/21/23 0914 210 lb (95.3 kg)     Height 11/21/23 0914 5' 4 (1.626 m)     Head Circumference --      Peak Flow --      Pain Score 11/21/23 0914 7     Pain Loc --      Pain Education --      Exclude from Growth Chart --     Most recent vital signs: Vitals:   11/21/23 0915 11/21/23 0930  BP: 128/72   Pulse: 76   Resp: 18   Temp: 98 F (36.7 C)   SpO2: 100% 100%    Physical Exam Constitutional:      Appearance: She is well-developed.  HENT:     Head: Atraumatic.  Eyes:     Conjunctiva/sclera: Conjunctivae normal.  Cardiovascular:     Rate and Rhythm: Regular rhythm.  Pulmonary:     Effort: No respiratory  distress.  Abdominal:     General: There is no distension.     Tenderness: There is abdominal tenderness in the left upper quadrant.     Comments: Mild left-sided abdominal tenderness to palpation with no rebound or guarding.  No CVA tenderness.  Negative Murphy sign.  No right lower quadrant abdominal tenderness  Genitourinary:    Vagina: Normal.     Cervix: Normal. No cervical motion tenderness.  Musculoskeletal:        General: Normal range of motion.     Cervical back: Normal range of motion.  Skin:    General: Skin is warm.  Neurological:     Mental Status: She is alert. Mental status is at baseline.     IMPRESSION / MDM / ASSESSMENT AND PLAN / ED COURSE  I reviewed the triage vital signs and the nursing notes.  Differential diagnosis including threatened miscarriage, ectopic pregnancy, electrolyte abnormality.  Have a low suspicion for acute appendicitis or acute cholecystitis.  No urinary symptoms  and have a low suspicion for kidney stone or pyelonephritis.  Clinical picture is not consistent with an ovarian torsion.  After pelvic exam no concern for PID.  Gonorrhea and chlamydia obtained and wet prep obtained.  Plan for transvaginal ultrasound   RADIOLOGY I independently reviewed imaging, my interpretation of imaging: Ultrasound for*[facility]  LABS (all labs ordered are listed, but only abnormal results are displayed) Labs interpreted as -    Labs Reviewed  WET PREP, GENITAL - Abnormal; Notable for the following components:      Result Value   Clue Cells Wet Prep HPF POC POSITIVE (*)    WBC, Wet Prep HPF POC >=10 (*)    All other components within normal limits  COMPREHENSIVE METABOLIC PANEL WITH GFR - Abnormal; Notable for the following components:   Calcium 8.5 (*)    All other components within normal limits  CBC - Abnormal; Notable for the following components:   Hemoglobin 11.9 (*)    MCH 25.6 (*)    All other components within normal limits  URINALYSIS,  ROUTINE W REFLEX MICROSCOPIC - Abnormal; Notable for the following components:   Color, Urine YELLOW (*)    APPearance HAZY (*)    Leukocytes,Ua SMALL (*)    All other components within normal limits  HCG, QUANTITATIVE, PREGNANCY - Abnormal; Notable for the following components:   hCG, Beta Chain, Earleen RAMAN [phone] (*)    All other components within normal limits  CHLAMYDIA/NGC RT PCR ([facility] ONLY)            LIPASE, BLOOD  ABO/RH     MDM   Clinical Course as of 11/21/23 1439  Mon Nov 21, 2023  1329 Ultrasound resulted.  Concern for possible dermoid cyst to the left ovary and unable to get a Doppler given the location of the ovary.  Reached out to obstetrician group for further recommendations, lower suspicion for a torsion.  Currently you are busy and will call back.  Sent a secure message with patient's information.  Does have positive clue cells and WBCs on her wet prep.  Gonorrhea and Chlamydia were negative.  No other findings of urinary tract infection. [SM]    Clinical Course User Index [SM] Suzanne Kirsch, MD   Evaluated by Dr. Starla with gynecology in the emergency department.  Likely not an ovarian torsion and felt that she could be discharged home with outpatient follow-up.  Patient's pain is well-controlled at this time.  I have very low concern for ovarian torsion.  Normal IUP.  Does have clue cells so we will treat for bacterial vaginosis.  Patient was given return precautions for any worsening pain for concern for torsion.  Will follow-up as an outpatient with obstetrician.  PROCEDURES:  Critical Care performed: No  Procedures  Patient's presentation is most consistent with acute presentation with potential threat to life or bodily function.   MEDICATIONS ORDERED IN ED: Medications - No data to display  FINAL CLINICAL IMPRESSION(S) / ED DIAGNOSES   Final diagnoses:  Left ovarian cyst  Left lower quadrant abdominal pain  Bacterial vaginosis in pregnancy     Rx  / DC Orders   ED Discharge Orders          Ordered    metroNIDAZOLE  (FLAGYL ) 500 MG tablet  2 times daily        11/21/23 1435             Note:  This document was prepared using Dragon voice recognition software and  may include unintentional dictation errors.   Suzanne Kirsch, MD 11/21/23 [phone]

## 2023-11-21 NOTE — ED Notes (Signed)
 Per Dr. Suzanne, fetal heart tones not needed because pt will be getting transvaginal US .

## 2023-11-21 NOTE — ED Triage Notes (Signed)
 Pt comes with [redacted] wks pregnant and some cramping. Pt states the last few days her cramping has gotten worse. Pt denies any current bleeding. Pt states at 6 weeks she had cramping and some bleeding that resolved.

## 2023-11-21 NOTE — ED Notes (Signed)
 I called Ultrasound and checked on the pt status. She advised they would get to the pt in approx 10 more minutes.

## 2023-11-24 ENCOUNTER — Ambulatory Visit (INDEPENDENT_AMBULATORY_CARE_PROVIDER_SITE_OTHER)

## 2023-11-24 DIAGNOSIS — Z3687 Encounter for antenatal screening for uncertain dates: Secondary | ICD-10-CM

## 2023-11-24 DIAGNOSIS — O3680X0 Pregnancy with inconclusive fetal viability, not applicable or unspecified: Secondary | ICD-10-CM

## 2023-11-24 DIAGNOSIS — Z3A10 10 weeks gestation of pregnancy: Secondary | ICD-10-CM

## 2023-12-08 ENCOUNTER — Ambulatory Visit (INDEPENDENT_AMBULATORY_CARE_PROVIDER_SITE_OTHER): Admitting: Obstetrics

## 2023-12-08 ENCOUNTER — Encounter: Payer: Self-pay | Admitting: Obstetrics

## 2023-12-08 ENCOUNTER — Other Ambulatory Visit (HOSPITAL_COMMUNITY)
Admission: RE | Admit: 2023-12-08 | Discharge: 2023-12-08 | Disposition: A | Discharge status: Home / Self Care | Source: Ambulatory Visit | Attending: Obstetrics | Admitting: Obstetrics

## 2023-12-08 VITALS — BP 112/68 | HR 79 | Wt 215.0 lb

## 2023-12-08 DIAGNOSIS — Z113 Encounter for screening for infections with a predominantly sexual mode of transmission: Secondary | ICD-10-CM | POA: Diagnosis not present

## 2023-12-08 DIAGNOSIS — Z0184 Encounter for antibody response examination: Secondary | ICD-10-CM | POA: Diagnosis not present

## 2023-12-08 DIAGNOSIS — Z3480 Encounter for supervision of other normal pregnancy, unspecified trimester: Secondary | ICD-10-CM

## 2023-12-08 DIAGNOSIS — N83209 Unspecified ovarian cyst, unspecified side: Secondary | ICD-10-CM | POA: Insufficient documentation

## 2023-12-08 DIAGNOSIS — Z13220 Encounter for screening for lipoid disorders: Secondary | ICD-10-CM

## 2023-12-08 DIAGNOSIS — Z131 Encounter for screening for diabetes mellitus: Secondary | ICD-10-CM | POA: Diagnosis not present

## 2023-12-08 DIAGNOSIS — Z3481 Encounter for supervision of other normal pregnancy, first trimester: Secondary | ICD-10-CM | POA: Diagnosis not present

## 2023-12-08 DIAGNOSIS — Z0283 Encounter for blood-alcohol and blood-drug test: Secondary | ICD-10-CM

## 2023-12-08 DIAGNOSIS — Z1379 Encounter for other screening for genetic and chromosomal anomalies: Secondary | ICD-10-CM | POA: Insufficient documentation

## 2023-12-08 DIAGNOSIS — Z3009 Encounter for other general counseling and advice on contraception: Secondary | ICD-10-CM | POA: Diagnosis not present

## 2023-12-08 DIAGNOSIS — D649 Anemia, unspecified: Secondary | ICD-10-CM

## 2023-12-08 DIAGNOSIS — Z3A12 12 weeks gestation of pregnancy: Secondary | ICD-10-CM | POA: Diagnosis not present

## 2023-12-08 MED ORDER — ASPIRIN 81 MG PO TBEC: 81.0000 mg | DELAYED_RELEASE_TABLET | Freq: Every day | ORAL | 3 refills | Status: AC

## 2023-12-08 NOTE — Progress Notes (Signed)
 NEW OB HISTORY AND PHYSICAL  SUBJECTIVE:       Jody Mooney is a 28 y.o. [phone] female, Patient's last menstrual period was 09/13/2023 (exact date)., Estimated Date of Delivery: 06/19/24, [redacted]w[redacted]d, presents today for establishment of Prenatal Care. She reports nausea.  She has a h/o right oophorectomy for a large cyst. She currently has a 7 cm cyst on her left ovary.  Social history Partner/Relationship: partnered Living situation: lives with partner and children. Feels safe there. Work: [employer] Exercise: walking Substance use: denies   Gynecologic History Patient's last menstrual period was 09/13/2023 (exact date). Normal Contraception: none Last Pap: 10/21/2022. Results were: normal  Obstetric History OB History  Gravida Para Term Preterm AB Living  5 3 3  1 3   SAB IAB Ectopic Multiple Live Births  1   0 3    # Outcome Date GA Lbr Len/2nd Weight Sex Type Anes PTL Lv  5 Current           4 Term 09/06/22 [redacted]w[redacted]d  7 lb 1.2 oz (3.21 kg) M Vag-Spont Local  LIV  3 SAB 10/2021     SAB     2 Term 03/20/21 [redacted]w[redacted]d / 00:17 7 lb 3.7 oz (3.28 kg) F Vag-Spont None  LIV  1 Term 03/04/18 [redacted]w[redacted]d 07:26 / 00:12 6 lb 9.6 oz (2.994 kg) F Vag-Spont None  LIV    Past Medical History:  Diagnosis Date   Asthma     Past Surgical History:  Procedure Laterality Date   CYSTOSCOPY N/A 05/15/2019   Procedure: CYSTOSCOPY;  Surgeon: Victor Claudell SAUNDERS, MD;  Location: [facility] ORS;  Service: Gynecology;  Laterality: N/A;   LAPAROSCOPY  05/15/2019   Procedure: LAPAROSCOPY DIAGNOSTIC WITH RIGHT OOPHORECTOMY AND LEFT OVARIAN CYSTECTOMY;  Surgeon: Victor Claudell SAUNDERS, MD;  Location: [facility] ORS;  Service: Gynecology;;   WISDOM TOOTH EXTRACTION      Current Outpatient Medications on File Prior to Visit  Medication Sig Dispense Refill   Prenat-FeCbn-FeAsp-Meth-FA-DHA (PRENATE MINI ) 18-0.6-0.4-350 MG CAPS Take 1 each by mouth daily. 30 capsule 9   No current facility-administered medications on file prior to visit.     Allergies  Allergen Reactions   Vancomycin  Itching and Other (See Comments)    Red man syndrome  vancomycin    Penicillins Rash and Dermatitis    Has patient had a PCN reaction causing immediate rash, facial/tongue/throat swelling, SOB or lightheadedness with hypotension: Unknown  Has patient had a PCN reaction causing severe rash involving mucus membranes or skin necrosis: Unknown  Has patient had a PCN reaction that required hospitalization: Unknown  Has patient had a PCN reaction occurring within the last 10 years: no  If all of the above answers are NO, then may proceed with Cephalosporin use.  Has patient had a PCN reaction causing immediate rash, facial/tongue/throat swelling, SOB or lightheadedness with hypotension: Unknown Has patient had a PCN reaction causing severe rash involving mucus membranes or skin necrosis: Unknown Has patient had a PCN reaction that required hospitalization: Unknown Has patient had a PCN reaction occurring within the last 10 years: no If all of the above answers are NO, then may proceed with Cephalosporin use.    Social History   Socioeconomic History   Marital status: Significant Other    Spouse name: Francis   Number of children: 3   Years of education: 16   Highest education level: Bachelor's degree (e.g., BA, AB, BS)  Occupational History   Occupation: runs a Soil scientist  Occupation: STAY AT HOME MOM  Tobacco Use   Smoking status: Never   Smokeless tobacco: Never  Vaping Use   Vaping status: Never Used  Substance and Sexual Activity   Alcohol use: No   Drug use: Never   Sexual activity: Yes    Partners: Male    Birth control/protection: None  Other Topics Concern   Not on file  Social History Narrative   Currently attending college.    Feels safe in relationship of 2 years.   Social Drivers of Corporate investment banker Strain: Low Risk  (11/02/2023)   Overall Financial Resource Strain (CARDIA)    Difficulty of  Paying Living Expenses: Not hard at all  Food Insecurity: No Food Insecurity (11/02/2023)   Hunger Vital Sign    Worried About Running Out of Food in the Last Year: Never true    Ran Out of Food in the Last Year: Never true  Transportation Needs: No Transportation Needs (11/02/2023)   PRAPARE - Administrator, Civil Service (Medical): No    Lack of Transportation (Non-Medical): No  Physical Activity: Insufficiently Active (11/02/2023)   Exercise Vital Sign    Days of Exercise per Week: 3 days    Minutes of Exercise per Session: 40 min  Stress: Stress Concern Present (11/02/2023)   [employer] of Occupational Health - Occupational Stress Questionnaire    Feeling of Stress: To some extent  Social Connections: Socially Isolated (11/02/2023)   Social Connection and Isolation Panel    Frequency of Communication with Friends and Family: Twice a week    Frequency of Social Gatherings with Friends and Family: Never    Attends Religious Services: Never    Database administrator or Organizations: No    Attends Engineer, structural: Not on file    Marital Status: Never married  Intimate Partner Violence: Not At Risk (11/04/2023)   Humiliation, Afraid, Rape, and Kick questionnaire    Fear of Current or Ex-Partner: No    Emotionally Abused: No    Physically Abused: No    Sexually Abused: No    Family History  Problem Relation Age of Onset   Anxiety disorder Mother    Hyperlipidemia Mother    Healthy Father    Healthy Sister    Healthy Brother    Healthy Brother    Anxiety disorder Maternal Grandmother    Diabetes Maternal Grandmother    Diabetes Maternal Grandfather    Anxiety disorder Other        Great Grandmother   Depression Other        Grandmother and mother   Hyperlipidemia Other    Diabetes type II Other        Grand parents   Other Other        VSD repaired in grandmother age 65    The following portions of the patient's history were reviewed and  updated as appropriate: allergies, current medications, past OB history, past medical history, past surgical history, past family history, past social history, and problem list.  Constitutional: Denied constitutional symptoms, night sweats, recent illness, fatigue, fever, insomnia and weight loss.  Eyes: Denied eye symptoms, eye pain, photophobia, vision change and visual disturbance.  Ears/Nose/Throat/Neck: Denied ear, nose, throat or neck symptoms, hearing loss, nasal discharge, sinus congestion and sore throat.  Cardiovascular: Denied cardiovascular symptoms, arrhythmia, chest pain/pressure, edema, exercise intolerance, orthopnea and palpitations.  Respiratory: Denied pulmonary symptoms, asthma, pleuritic pain, productive sputum, cough, dyspnea and wheezing.  Gastrointestinal: Denied gastro-esophageal reflux, melena, nausea and vomiting.  Genitourinary: Denied genitourinary symptoms including symptomatic vaginal discharge, pelvic relaxation issues, and urinary complaints.  Musculoskeletal: Denied musculoskeletal symptoms, stiffness, swelling, muscle weakness and myalgia.  Dermatologic: Denied dermatology symptoms, rash and scar.  Neurologic: Denied neurology symptoms, dizziness, headache, neck pain and syncope.  Psychiatric: Denied psychiatric symptoms, anxiety and depression.  Endocrine: Denied endocrine symptoms including hot flashes and night sweats.    Indications for ASA therapy (per uptodate) One of the following: Previous pregnancy with preeclampsia, especially early onset and with an adverse outcome No Multifetal gestation No Chronic hypertension No Type 1 or 2 diabetes mellitus No Chronic kidney disease No Autoimmune disease (antiphospholipid syndrome, systemic lupus erythematosus) No  Two or more of the following: Nulliparity No Obesity (body mass index >30 kg/m2) Yes Family history of preeclampsia in mother or sister No Age >=35 years Yes Sociodemographic characteristics  (African American race, low socioeconomic level) Yes Personal risk factors (eg, previous pregnancy with low birth weight or small for gestational age infant, previous adverse pregnancy outcome [eg, stillbirth], interval >10 years between pregnancies) No   OBJECTIVE: Initial Physical Exam (New OB)  GENERAL APPEARANCE: alert, well appearing HEAD: normocephalic, atraumatic MOUTH: mucous membranes moist, pharynx normal without lesions THYROID : no thyromegaly or masses present BREASTS: no masses noted, no significant tenderness, no palpable axillary nodes, no skin changes LUNGS: clear to auscultation, no wheezes, rales or rhonchi, symmetric air entry HEART: regular rate and rhythm, no murmurs ABDOMEN: soft, nontender, nondistended, no abnormal masses, no epigastric pain and FHT present EXTREMITIES: no redness or tenderness in the calves or thighs SKIN: normal coloration and turgor, no rashes LYMPH NODES: no adenopathy palpable NEUROLOGIC: alert, oriented, normal speech, no focal findings or movement disorder noted  PELVIC EXAM declined  ASSESSMENT: Normal pregnancy   PLAN: Routine prenatal care. We discussed an overview of prenatal care and when to call. Reviewed diet, exercise, and weight gain recommendations in pregnancy. Discussed benefits of breastfeeding and lactation resources at [facility]. I answered all questions. Labs and genetic screening today.  See orders  Eleanor Canny, CNM

## 2023-12-09 LAB — CERVICOVAGINAL ANCILLARY ONLY
Bacterial Vaginitis (gardnerella): POSITIVE — AB
Candida Glabrata: NEGATIVE
Candida Vaginitis: POSITIVE — AB
Chlamydia: NEGATIVE
Comment: NEGATIVE
Comment: NEGATIVE
Comment: NEGATIVE
Comment: NEGATIVE
Comment: NEGATIVE
Comment: NORMAL
Neisseria Gonorrhea: NEGATIVE
Trichomonas: NEGATIVE

## 2023-12-09 LAB — MICROSCOPIC EXAMINATION
Casts: NONE SEEN /LPF
RBC, Urine: NONE SEEN /HPF (ref 0–2)

## 2023-12-09 LAB — URINALYSIS, ROUTINE W REFLEX MICROSCOPIC
Bilirubin, UA: NEGATIVE
Glucose, UA: NEGATIVE
Ketones, UA: NEGATIVE
Nitrite, UA: NEGATIVE
Protein,UA: NEGATIVE
RBC, UA: NEGATIVE
Specific Gravity, UA: 1.016 (ref 1.005–1.030)
Urobilinogen, Ur: 0.2 mg/dL (ref 0.2–1.0)
pH, UA: 6.5 (ref 5.0–7.5)

## 2023-12-09 LAB — CBC/D/PLT+RPR+RH+ABO+RUBIGG...
Antibody Screen: NEGATIVE
Basophils Absolute: 0 x10E3/uL (ref 0.0–0.2)
Basos: 0 %
EOS (ABSOLUTE): 0.1 x10E3/uL (ref 0.0–0.4)
Eos: 1 %
HCV Ab: NONREACTIVE
HIV Screen 4th Generation wRfx: NONREACTIVE
Hematocrit: 39.7 % (ref 34.0–46.6)
Hemoglobin: 12.5 g/dL (ref 11.1–15.9)
Hepatitis B Surface Ag: NEGATIVE
Immature Grans (Abs): 0 x10E3/uL (ref 0.0–0.1)
Immature Granulocytes: 0 %
Lymphocytes Absolute: 1.2 x10E3/uL (ref 0.7–3.1)
Lymphs: 18 %
MCH: 26.2 pg — ABNORMAL LOW (ref 26.6–33.0)
MCHC: 31.5 g/dL (ref 31.5–35.7)
MCV: 83 fL (ref 79–97)
Monocytes Absolute: 0.5 x10E3/uL (ref 0.1–0.9)
Monocytes: 7 %
Neutrophils Absolute: 5 x10E3/uL (ref 1.4–7.0)
Neutrophils: 74 %
Platelets: 251 x10E3/uL (ref 150–450)
RBC: 4.77 x10E6/uL (ref 3.77–5.28)
RDW: 14.6 % (ref 11.7–15.4)
RPR Ser Ql: NONREACTIVE
Rh Factor: POSITIVE
Rubella Antibodies, IGG: 3.67 {index} (ref >0.99)
Varicella zoster IgG: NONREACTIVE
WBC: 6.8 x10E3/uL (ref 3.4–10.8)

## 2023-12-09 LAB — HCV INTERPRETATION

## 2023-12-10 LAB — CULTURE, OB URINE

## 2023-12-10 LAB — MONITOR DRUG PROFILE 14(MW)
Amphetamine Scrn, Ur: NEGATIVE ng/mL
BARBITURATE SCREEN URINE: NEGATIVE ng/mL
BENZODIAZEPINE SCREEN, URINE: NEGATIVE ng/mL
Buprenorphine, Urine: NEGATIVE ng/mL
CANNABINOIDS UR QL SCN: NEGATIVE ng/mL
Cocaine (Metab) Scrn, Ur: NEGATIVE ng/mL
Creatinine(Crt), U: 101.3 mg/dL (ref 20.0–300.0)
Fentanyl, Urine: NEGATIVE pg/mL
Meperidine Screen, Urine: NEGATIVE ng/mL
Methadone Screen, Urine: NEGATIVE ng/mL
OXYCODONE+OXYMORPHONE UR QL SCN: NEGATIVE ng/mL
Opiate Scrn, Ur: NEGATIVE ng/mL
Ph of Urine: 6 (ref 4.5–8.9)
Phencyclidine Qn, Ur: NEGATIVE ng/mL
Propoxyphene Scrn, Ur: NEGATIVE ng/mL
SPECIFIC GRAVITY: 1.011
Tramadol Screen, Urine: NEGATIVE ng/mL

## 2023-12-10 LAB — NICOTINE SCREEN, URINE: Cotinine Ql Scrn, Ur: NEGATIVE ng/mL

## 2023-12-10 LAB — URINE CULTURE, OB REFLEX

## 2023-12-11 ENCOUNTER — Other Ambulatory Visit: Payer: Self-pay | Admitting: Obstetrics

## 2023-12-11 ENCOUNTER — Encounter: Payer: Self-pay | Admitting: Obstetrics

## 2023-12-11 DIAGNOSIS — B9689 Other specified bacterial agents as the cause of diseases classified elsewhere: Secondary | ICD-10-CM | POA: Insufficient documentation

## 2023-12-11 MED ORDER — METRONIDAZOLE 500 MG PO TABS: 500.0000 mg | ORAL_TABLET | Freq: Two times a day (BID) | ORAL | 0 refills | Status: DC

## 2023-12-11 NOTE — Progress Notes (Signed)
+  BV & yeast. Rx for metronidazole  500 mg PO BID x 7 days sent to pharmacy. Rec Monistat  7. Arnice notified via MyChart.  M. Justino, CNM

## 2023-12-14 LAB — MATERNIT 21 PLUS CORE, BLOOD
Fetal Fraction: 17
Result (T21): NEGATIVE
Trisomy 13 (Patau syndrome): NEGATIVE
Trisomy 18 (Edwards syndrome): NEGATIVE
Trisomy 21 (Down syndrome): NEGATIVE

## 2023-12-14 LAB — HEMOGLOBIN A1C
Est. average glucose Bld gHb Est-mCnc: 103 mg/dL
Hgb A1c MFr Bld: 5.2 % (ref 4.8–5.6)

## 2023-12-15 ENCOUNTER — Encounter: Payer: Self-pay | Admitting: Obstetrics

## 2024-01-05 ENCOUNTER — Other Ambulatory Visit (HOSPITAL_COMMUNITY)
Admission: RE | Admit: 2024-01-05 | Discharge: 2024-01-05 | Disposition: A | Discharge status: Home / Self Care | Source: Ambulatory Visit | Attending: Registered Nurse | Admitting: Registered Nurse

## 2024-01-05 ENCOUNTER — Ambulatory Visit (INDEPENDENT_AMBULATORY_CARE_PROVIDER_SITE_OTHER): Admitting: Registered Nurse

## 2024-01-05 VITALS — BP 108/59 | HR 80 | Wt 217.0 lb

## 2024-01-05 DIAGNOSIS — O26892 Other specified pregnancy related conditions, second trimester: Secondary | ICD-10-CM

## 2024-01-05 DIAGNOSIS — N898 Other specified noninflammatory disorders of vagina: Secondary | ICD-10-CM

## 2024-01-05 DIAGNOSIS — Z3480 Encounter for supervision of other normal pregnancy, unspecified trimester: Secondary | ICD-10-CM

## 2024-01-05 DIAGNOSIS — Z3A16 16 weeks gestation of pregnancy: Secondary | ICD-10-CM

## 2024-01-05 DIAGNOSIS — Z3689 Encounter for other specified antenatal screening: Secondary | ICD-10-CM

## 2024-01-05 NOTE — Progress Notes (Signed)
    Return Prenatal Note   Subjective   28 y.o. H4E6986 at [redacted]w[redacted]d presents for this follow-up prenatal visit.  Patient states she has constipation and hemorrhoid pain. Improved now with increased fiber intake. Was recently treated for BV and took her medication but now thinks she may have yeast. She says this has happened in her other pregnancies with her girls.  Patient reports: Movement: Present Contractions: Not present  Objective   Flow sheet Vitals: Pulse Rate: 80 BP: (!) 108/59 Fetal Heart Rate (bpm): 150 Total weight gain: 21 lb (9.526 kg)  General Appearance  No acute distress, well appearing, and well nourished Pulmonary   Normal work of breathing Neurologic   Alert and oriented to person, place, and time Psychiatric   Mood and affect within normal limits   Assessment/Plan   Plan  28 y.o. H4E6986 at [redacted]w[redacted]d presents for follow-up OB visit. Reviewed prenatal record including previous visit note.  Vaginal itching Recently treated for BV. Now thinks she has yeast. Self-swabbed for vaginitis panel. Will tx pending results.   Supervision of other normal pregnancy, antepartum F/u 4 wks for anatomy scan. Discussed other strategies for managing constipation if fiber and fluids aren't effective.       Orders Placed This Encounter  Procedures   US  OB Comp + 14 Wk    Standing Status:   Future    Expected Date:   02/02/2024    Expiration Date:   02/04/2024    Reason for Exam (SYMPTOM  OR DIAGNOSIS REQUIRED):   Needs anatomy u/s    Preferred Imaging Location?:   Internal             at [facility]   No follow-ups on file.   Future Appointments  Date Time Provider Department Center  01/30/2024  1:00 PM [facility] US  1 AOB-IMG None  01/30/2024  2:35 PM Swanson, Eleanor HERO, CNM [facility] None    For next visit:  continue with routine prenatal care     Lauraine Lakes, CNM  01/05/24 12:37 PM

## 2024-01-05 NOTE — Assessment & Plan Note (Signed)
 Recently treated for BV. Now thinks she has yeast. Self-swabbed for vaginitis panel. Will tx pending results.

## 2024-01-05 NOTE — Patient Instructions (Signed)
 Second Trimester of Pregnancy  The second trimester of pregnancy is from week 14 through week 27. This is months 4 through 6 of pregnancy. During the second trimester: Morning sickness is less or has stopped. You may have more energy. You may feel hungry more often. At this time, your unborn baby is growing very fast. At the end of the sixth month, the unborn baby may be up to 12 inches long and weigh about 1 pounds. You will likely start to feel the baby move between 16 and 20 weeks of pregnancy. Body changes during your second trimester Your body continues to change during this time. The changes usually go away after your baby is born. Physical changes You will gain more weight. Your belly will get bigger. You may begin to get stretch marks on your hips, belly, and breasts. Your breasts will keep growing and may hurt. You may get dark spots or blotches on your face. A dark line from your belly button to the pubic area may appear. This line is called linea nigra. Your hair may grow faster and get thicker. Health changes You may have headaches. You may have heartburn. You may pee more often. You may have swollen, bulging veins (varicose veins). You may have trouble pooping (constipation), or swollen veins in the butt that can itch or get painful (hemorrhoids). You may have back pain. This is caused by: Weight gain. Pregnancy hormones that are relaxing the joints in your pelvis. Follow these instructions at home: Medicines Talk to your health care provider if you're taking medicines. Ask if the medicines are safe to take during pregnancy. Your provider may change the medicines that you take. Do not take any medicines unless told to by your provider. Take a prenatal vitamin that has at least 600 micrograms (mcg) of folic acid. Do not use herbal medicines, illegal drugs, or medicines that are not approved by your provider. Eating and drinking While you're pregnant your body needs  extra food for your growing baby. Talk with your provider about what to eat while pregnant. Activity Most women are able to exercise during pregnancy. Exercises may need to change as your pregnancy goes on. Talk to your provider about your activities and exercise routines. Relieving pain and discomfort Wear a good, supportive bra if your breasts hurt. Rest with your legs raised if you have leg cramps or low back pain. Take warm sitz baths to soothe pain from hemorrhoids. Use hemorrhoid cream if your provider says it's okay. Do not douche. Do not use tampons or scented pads. Do not use hot tubs, steam rooms, or saunas. Safety Wear your seatbelt at all times when you're in a car. Talk to your provider if someone hits you, hurts you, or yells at you. Talk with your provider if you're feeling sad or have thoughts of hurting yourself. Lifestyle Certain things can be harmful while you're pregnant. It's best to avoid the following: Do not drink alcohol,smoke, vape, or use products with nicotine or tobacco in them. If you need help quitting, talk with your provider. Avoid cat litter boxes and soil used by cats. These things carry germs that can cause harm to your pregnancy and your baby. General instructions Keep all follow-up visits. It helps you and your unborn baby stay as healthy as possible. Write down your questions. Take them to your prenatal visits. Your provider will: Talk with you about your overall health. Give you advice or refer you to specialists who can help with different needs,  including: Prenatal education classes. Mental health and counseling. Foods and healthy eating. Ask for help if you need help with food. Where to find more information American Pregnancy Association: americanpregnancy.org [employer] of Obstetricians and Gynecologists: acog.org Office on [employer] Health: TravelLesson.ca Contact a health care provider if: You have a headache that does not go away  when you take medicine. You have any of these problems: You can't eat or drink. You throw up or feel like you may throw up. You have watery poop (diarrhea) for 2 days or more. You have pain when you pee or your pee smells bad. You have been sick for 2 days or more and are not getting better. Contact your provider right away if: You have any of these coming from your vagina: Abnormal discharge. Bad-smelling fluid. Bleeding. Your baby is moving less than usual. You have contractions, belly cramping, or have pain in your pelvis or lower back. You have symptoms of high blood pressure or preeclampsia. These include: A severe, throbbing headache that does not go away. Sudden or extreme swelling of your face, hands, legs, or feet. Vision problems: You see spots. You have blurry vision. Your eyes are sensitive to light. If you can't reach the provider, go to an urgent care or emergency room. Get help right away if: You faint, become confused, or can't think clearly. You have chest pain or trouble breathing. You have any kind of injury, such as from a fall or a car crash. These symptoms may be an emergency. Call 911 right away. Do not wait to see if the symptoms will go away. Do not drive yourself to the hospital. This information is not intended to replace advice given to you by your health care provider. Make sure you discuss any questions you have with your health care provider. Document Revised: 11/18/2022 Document Reviewed: 06/18/2022 Elsevier Patient Education  2024 [employer].

## 2024-01-05 NOTE — Assessment & Plan Note (Signed)
 F/u 4 wks for anatomy scan. Discussed other strategies for managing constipation if fiber and fluids aren't effective.

## 2024-01-06 ENCOUNTER — Ambulatory Visit: Payer: Self-pay | Admitting: Registered Nurse

## 2024-01-06 DIAGNOSIS — N760 Acute vaginitis: Secondary | ICD-10-CM

## 2024-01-06 LAB — CERVICOVAGINAL ANCILLARY ONLY
Bacterial Vaginitis (gardnerella): POSITIVE — AB
Candida Glabrata: NEGATIVE
Candida Vaginitis: POSITIVE — AB
Comment: NEGATIVE
Comment: NEGATIVE
Comment: NEGATIVE

## 2024-01-06 MED ORDER — METRONIDAZOLE 0.75 % VA GEL: 1.0000 | Freq: Every day | VAGINAL | 1 refills | Status: DC

## 2024-01-06 MED ORDER — FLUCONAZOLE 150 MG PO TABS: 150.0000 mg | ORAL_TABLET | ORAL | 1 refills | Status: DC

## 2024-01-30 ENCOUNTER — Ambulatory Visit

## 2024-01-30 ENCOUNTER — Ambulatory Visit: Admitting: Obstetrics

## 2024-01-30 VITALS — BP 103/66 | HR 77 | Wt 222.0 lb

## 2024-01-30 DIAGNOSIS — Z3480 Encounter for supervision of other normal pregnancy, unspecified trimester: Secondary | ICD-10-CM

## 2024-01-30 DIAGNOSIS — Z3A19 19 weeks gestation of pregnancy: Secondary | ICD-10-CM

## 2024-01-30 DIAGNOSIS — N760 Acute vaginitis: Secondary | ICD-10-CM

## 2024-01-30 DIAGNOSIS — B3731 Acute candidiasis of vulva and vagina: Secondary | ICD-10-CM | POA: Insufficient documentation

## 2024-01-30 DIAGNOSIS — N83209 Unspecified ovarian cyst, unspecified side: Secondary | ICD-10-CM

## 2024-01-30 DIAGNOSIS — O2392 Unspecified genitourinary tract infection in pregnancy, second trimester: Secondary | ICD-10-CM | POA: Diagnosis not present

## 2024-01-30 DIAGNOSIS — Z3689 Encounter for other specified antenatal screening: Secondary | ICD-10-CM | POA: Diagnosis not present

## 2024-01-30 DIAGNOSIS — Z3A20 20 weeks gestation of pregnancy: Secondary | ICD-10-CM | POA: Diagnosis not present

## 2024-01-30 DIAGNOSIS — O99891 Other specified diseases and conditions complicating pregnancy: Secondary | ICD-10-CM

## 2024-01-30 MED ORDER — FLUCONAZOLE 150 MG PO TABS: 150.0000 mg | ORAL_TABLET | ORAL | 1 refills | Status: DC

## 2024-01-30 NOTE — Assessment & Plan Note (Signed)
-  Anatomy scan today - preliminary report shows normal results -Anticipatory guidance about second trimester -Reviewed when to seek medical attention

## 2024-01-30 NOTE — Assessment & Plan Note (Signed)
-  Discussed comfort measures, partner treatment for BV, use of condoms -Rx sent for Diflucan  if recurrence of yeast occurs

## 2024-01-30 NOTE — Assessment & Plan Note (Addendum)
-  Cyst now 10 cm per preliminary US  report -Knows to seek emergent medical attention for severe pain

## 2024-01-30 NOTE — Progress Notes (Signed)
    Return Prenatal Note   Assessment/Plan   Plan  28 y.o. H4E6986 at [redacted]w[redacted]d presents for follow-up OB visit. Reviewed prenatal record including previous visit note.  Ovarian cyst -Cyst now 10 cm per preliminary US  report -Knows to seek emergent medical attention for severe pain  Recurrent candidiasis of vagina -Discussed comfort measures, partner treatment for BV, use of condoms -Rx sent for Diflucan  if recurrence of yeast occurs  Supervision of other normal pregnancy, antepartum -Anatomy scan today - preliminary report shows normal results -Anticipatory guidance about second trimester -Reviewed when to seek medical attention    No orders of the defined types were placed in this encounter.  Return in about 4 weeks (around 02/27/2024) for ROB.   Future Appointments  Date Time Provider Department Center  02/27/2024 11:15 AM Jayne Harlene CROME, CNM [facility] None    For next visit:  Routine prenatal care    Subjective  Jody Mooney's nausea is mostly gone! She has noticed an increase in discharge again and suspects she may have yeast or BV again, but she denies odor, itching, and other symptoms. She prefers to avoid medications unless she becomes symptomatic. She has noticed that symptoms seem to be related to intercourse.  Movement: Present Contractions: Not present  Objective   Flow sheet Vitals: Pulse Rate: 77 BP: 103/66 Total weight gain: 26 lb (11.8 kg)  General Appearance  No acute distress, well appearing, and well nourished Pulmonary   Normal work of breathing Neurologic   Alert and oriented to person, place, and time Psychiatric   Mood and affect within normal limits  Eleanor Canny, CNM 01/30/24 2:49 PM

## 2024-02-27 ENCOUNTER — Ambulatory Visit (INDEPENDENT_AMBULATORY_CARE_PROVIDER_SITE_OTHER): Admitting: Certified Nurse Midwife

## 2024-02-27 ENCOUNTER — Other Ambulatory Visit (HOSPITAL_COMMUNITY)
Admission: RE | Admit: 2024-02-27 | Discharge: 2024-02-27 | Disposition: A | Discharge status: Home / Self Care | Source: Ambulatory Visit | Attending: Certified Nurse Midwife | Admitting: Certified Nurse Midwife

## 2024-02-27 VITALS — BP 109/67 | HR 88 | Wt 230.5 lb

## 2024-02-27 DIAGNOSIS — N898 Other specified noninflammatory disorders of vagina: Secondary | ICD-10-CM | POA: Diagnosis present

## 2024-02-27 DIAGNOSIS — O26892 Other specified pregnancy related conditions, second trimester: Secondary | ICD-10-CM | POA: Diagnosis present

## 2024-02-27 DIAGNOSIS — B3731 Acute candidiasis of vulva and vagina: Secondary | ICD-10-CM

## 2024-02-27 DIAGNOSIS — O23592 Infection of other part of genital tract in pregnancy, second trimester: Secondary | ICD-10-CM

## 2024-02-27 DIAGNOSIS — Z3480 Encounter for supervision of other normal pregnancy, unspecified trimester: Secondary | ICD-10-CM

## 2024-02-27 DIAGNOSIS — Z113 Encounter for screening for infections with a predominantly sexual mode of transmission: Secondary | ICD-10-CM

## 2024-02-27 DIAGNOSIS — Z3A23 23 weeks gestation of pregnancy: Secondary | ICD-10-CM

## 2024-02-27 DIAGNOSIS — Z369 Encounter for antenatal screening, unspecified: Secondary | ICD-10-CM

## 2024-02-27 DIAGNOSIS — Z131 Encounter for screening for diabetes mellitus: Secondary | ICD-10-CM

## 2024-02-27 DIAGNOSIS — Z114 Encounter for screening for human immunodeficiency virus [HIV]: Secondary | ICD-10-CM

## 2024-02-27 MED ORDER — FLUCONAZOLE 150 MG PO TABS: 150.0000 mg | ORAL_TABLET | ORAL | 0 refills | Status: AC

## 2024-02-27 NOTE — Assessment & Plan Note (Signed)
 Reviewed kick counts and preterm labor warning signs. Instructed to call office or come to hospital with persistent headache, vision changes, regular contractions, leaking of fluid, decreased fetal movement or vaginal bleeding.  Prepared for 28w labs.

## 2024-02-27 NOTE — Progress Notes (Signed)
" ° ° °  Return Prenatal Note   Subjective   28 y.o. H4E6986 at [redacted]w[redacted]d presents for this follow-up prenatal visit.  Patient reports irritation/itching, feels after IC this is worse & has had recurrent yeast through this pregnancy & prior pregnancies.  Patient reports: Movement: Present Contractions: Irritability  Objective   Flow sheet Vitals: Pulse Rate: 88 BP: 109/67 Fundal Height: 25 cm Fetal Heart Rate (bpm): 140 Total weight gain: 34 lb 8 oz (15.6 kg)  General Appearance  No acute distress, well appearing, and well nourished Pulmonary   Normal work of breathing Neurologic   Alert and oriented to person, place, and time Psychiatric   Mood and affect within normal limits   Assessment/Plan   Plan  28 y.o. H4E6986 at [redacted]w[redacted]d presents for follow-up OB visit. Reviewed prenatal record including previous visit note.  Recurrent candidiasis of vagina Aptima self collected, diflucan  prescription provided. If persistent would consider treating with Nystatin.  Supervision of other normal pregnancy, antepartum Reviewed kick counts and preterm labor warning signs. Instructed to call office or come to hospital with persistent headache, vision changes, regular contractions, leaking of fluid, decreased fetal movement or vaginal bleeding.  Prepared for 28w labs.      Orders Placed This Encounter  Procedures   28 Week RH+Panel    Standing Status:   Future    Expected Date:   03/29/2024    Expiration Date:   02/26/2025   Return in 4 weeks (on 03/26/2024) for ROB & GDM screening.   Future Appointments  Date Time Provider Department Center  03/26/2024  9:00 AM [facility] LAB [facility] None  03/26/2024  9:35 AM Slaughterbeck, Damien, CNM [facility] None  04/09/2024  9:35 AM Jayne Harlene CROME, CNM [facility] None  04/23/2024  9:35 AM Dominic, Jinnie Jansky, CNM [facility] None    For next visit:  ROB with 1 hour glucola, third trimester labs, and Tdap     Harlene CROME Jayne, CNM  12/29/20251:34 PM   "

## 2024-02-27 NOTE — Assessment & Plan Note (Signed)
 Aptima self collected, diflucan  prescription provided. If persistent would consider treating with Nystatin.

## 2024-02-27 NOTE — Patient Instructions (Signed)
 Second Trimester of Pregnancy  The second trimester of pregnancy is from week 14 through week 27. This is months 4 through 6 of pregnancy. During the second trimester: Morning sickness is less or has stopped. You may have more energy. You may feel hungry more often. At this time, your unborn baby is growing very fast. At the end of the sixth month, the unborn baby may be up to 12 inches long and weigh about 1 pounds. You will likely start to feel the baby move between 16 and 20 weeks of pregnancy. Body changes during your second trimester Your body continues to change during this time. The changes usually go away after your baby is born. Physical changes You will gain more weight. Your belly will get bigger. You may begin to get stretch marks on your hips, belly, and breasts. Your breasts will keep growing and may hurt. You may get dark spots or blotches on your face. A dark line from your belly button to the pubic area may appear. This line is called linea nigra. Your hair may grow faster and get thicker. Health changes You may have headaches. You may have heartburn. You may pee more often. You may have swollen, bulging veins (varicose veins). You may have trouble pooping (constipation), or swollen veins in the butt that can itch or get painful (hemorrhoids). You may have back pain. This is caused by: Weight gain. Pregnancy hormones that are relaxing the joints in your pelvis. Follow these instructions at home: Medicines Talk to your health care provider if you're taking medicines. Ask if the medicines are safe to take during pregnancy. Your provider may change the medicines that you take. Do not take any medicines unless told to by your provider. Take a prenatal vitamin that has at least 600 micrograms (mcg) of folic acid. Do not use herbal medicines, illegal drugs, or medicines that are not approved by your provider. Eating and drinking While you're pregnant your body needs  extra food for your growing baby. Talk with your provider about what to eat while pregnant. Activity Most women are able to exercise during pregnancy. Exercises may need to change as your pregnancy goes on. Talk to your provider about your activities and exercise routines. Relieving pain and discomfort Wear a good, supportive bra if your breasts hurt. Rest with your legs raised if you have leg cramps or low back pain. Take warm sitz baths to soothe pain from hemorrhoids. Use hemorrhoid cream if your provider says it's okay. Do not douche. Do not use tampons or scented pads. Do not use hot tubs, steam rooms, or saunas. Safety Wear your seatbelt at all times when you're in a car. Talk to your provider if someone hits you, hurts you, or yells at you. Talk with your provider if you're feeling sad or have thoughts of hurting yourself. Lifestyle Certain things can be harmful while you're pregnant. It's best to avoid the following: Do not drink alcohol,smoke, vape, or use products with nicotine  or tobacco in them. If you need help quitting, talk with your provider. Avoid cat litter boxes and soil used by cats. These things carry germs that can cause harm to your pregnancy and your baby. General instructions Keep all follow-up visits. It helps you and your unborn baby stay as healthy as possible. Write down your questions. Take them to your prenatal visits. Your provider will: Talk with you about your overall health. Give you advice or refer you to specialists who can help with different needs,  including: Prenatal education classes. Mental health and counseling. Foods and healthy eating. Ask for help if you need help with food. Where to find more information American Pregnancy Association: americanpregnancy.org [employer]: acog.org Office on [employer] Health: [employer] Contact a health care provider if: You have a headache that does not go away  when you take medicine. You have any of these problems: You can't eat or drink. You throw up or feel like you may throw up. You have watery poop (diarrhea) for 2 days or more. You have pain when you pee or your pee smells bad. You have been sick for 2 days or more and are not getting better. Contact your provider right away if: You have any of these coming from your vagina: Abnormal discharge. Bad-smelling fluid. Bleeding. Your baby is moving less than usual. You have contractions, belly cramping, or have pain in your pelvis or lower back. You have symptoms of high blood pressure or preeclampsia. These include: A severe, throbbing headache that does not go away. Sudden or extreme swelling of your face, hands, legs, or feet. Vision problems: You see spots. You have blurry vision. Your eyes are sensitive to light. If you can't reach the provider, go to an urgent care or emergency room. Get help right away if: You faint, become confused, or can't think clearly. You have chest pain or trouble breathing. You have any kind of injury, such as from a fall or a car crash. These symptoms may be an emergency. Call 911 right away. Do not wait to see if the symptoms will go away. Do not drive yourself to the hospital. This information is not intended to replace advice given to you by your health care provider. Make sure you discuss any questions you have with your health care provider. Document Revised: 11/18/2022 Document Reviewed: 06/18/2022 Elsevier Patient Education  2024 [employer] Problems to Watch for During Pregnancy During pregnancy, your body goes through many changes. Some changes may be uncomfortable. But most changes are not a serious problem. It's important to learn when certain signs and symptoms may be a problem. Talk with your health care provider about any medical conditions you have. Make sure you know the symptoms to watch for. Reporting problems early will prevent  complications. Problems to watch for during pregnancy You're more likely to get an infection during pregnancy. Let your provider know if you have signs of infection, such as: A fever. A bad-smelling fluid from your vagina. Peeing too often, wanting to pee urgently, or pain when you pee. Also, let your provider know if: You're very tired, you feel dizzy, or you faint. You have watery poop (diarrhea) for 24 hours or longer. You throw up or feel like throwing up for 24 hours or longer. You have cramping in your belly or have pain in your hips or lower back. You have spotting, bleeding, or leaking of fluid from your vagina. You have pain, swelling, or redness in an arm or leg. You should also watch for signs of high blood pressure and preeclampsia. These signs can be very serious. They include: A headache that doesn't go away when you take medicine. Sudden or very bad swelling of your face, hands, legs, or feet. Problems seeing, such as: You see spots. You have blurry vision. You may be sensitive to light. Why it's important to watch for these problems Watching and reporting problems to your provider can help prevent complications that may affect you and your baby. These  include: Higher risk of giving birth early. Infection that may be passed on to your baby. Higher risk for stillbirth. Follow these instructions at home:  Take your medicines only as told. Keep all follow-up visits. Your provider needs to monitor your health and your baby's health. Where to find more information To learn more, go to these websites: Centers for Disease Control and Prevention (CDC) at [employer]erProm[employer].no. Then: Click Health Topics A-Z. Type urgent maternal warning signs in the search box. [employer] (ACOG): acog.org Contact a health care provider if: You have any problems while you're pregnant. You feel your baby moving less than usual. You have any of these things: You  have strong emotions, such as sadness or anxiety, that affect your daily life. You do not feel safe in your home. You use tobacco, alcohol, or drugs, and you need help to stop. Get help right away if: You faint, have a seizure, or cannot think clearly. You have chest pain or difficulty breathing. You have any of the following symptoms and you were unable to reach your provider: You have symptoms of infection, including a fever, or have vaginal bleeding. You have symptoms of high blood pressure or preeclampsia. You have signs or symptoms of labor before 37 weeks of pregnancy. These include: Contractions that are 5 minutes or less apart, or that increase in frequency, intensity, or length. Sudden, sharp pain in the belly, or low back pain. Any amount of fluid that flows from your vagina without stopping. These symptoms may be an emergency. Call 911 right away. Do not wait to see if the symptoms will go away. Do not drive yourself to the hospital. This information is not intended to replace advice given to you by your health care provider. Make sure you discuss any questions you have with your health care provider. Document Revised: 07/27/2022 Document Reviewed: 07/27/2022 Elsevier Patient Education  2024 [employer].

## 2024-02-28 ENCOUNTER — Ambulatory Visit: Payer: Self-pay | Admitting: Certified Nurse Midwife

## 2024-02-28 ENCOUNTER — Encounter: Payer: Self-pay | Admitting: Certified Nurse Midwife

## 2024-02-28 ENCOUNTER — Other Ambulatory Visit: Payer: Self-pay

## 2024-02-28 DIAGNOSIS — B9689 Other specified bacterial agents as the cause of diseases classified elsewhere: Secondary | ICD-10-CM

## 2024-02-28 DIAGNOSIS — B379 Candidiasis, unspecified: Secondary | ICD-10-CM

## 2024-02-28 LAB — CERVICOVAGINAL ANCILLARY ONLY
Bacterial Vaginitis (gardnerella): POSITIVE — AB
Candida Glabrata: NEGATIVE
Candida Vaginitis: POSITIVE — AB
Comment: NEGATIVE
Comment: NEGATIVE
Comment: NEGATIVE

## 2024-02-28 MED ORDER — METRONIDAZOLE 500 MG PO TABS: 500.0000 mg | ORAL_TABLET | Freq: Two times a day (BID) | ORAL | 0 refills | Status: AC

## 2024-02-28 MED ORDER — FLUCONAZOLE 150 MG PO TABS: 150.0000 mg | ORAL_TABLET | Freq: Once | ORAL | 0 refills | Status: AC

## 2024-03-26 ENCOUNTER — Encounter: Admitting: Certified Nurse Midwife

## 2024-03-26 ENCOUNTER — Other Ambulatory Visit

## 2024-03-26 ENCOUNTER — Encounter: Admitting: Obstetrics

## 2024-03-28 ENCOUNTER — Other Ambulatory Visit (HOSPITAL_COMMUNITY)
Admission: RE | Admit: 2024-03-28 | Discharge: 2024-03-28 | Disposition: A | Discharge status: Home / Self Care | Source: Ambulatory Visit | Attending: Registered Nurse | Admitting: Registered Nurse

## 2024-03-28 ENCOUNTER — Other Ambulatory Visit

## 2024-03-28 ENCOUNTER — Ambulatory Visit: Admitting: Registered Nurse

## 2024-03-28 VITALS — BP 114/75 | HR 91 | Wt 233.1 lb

## 2024-03-28 DIAGNOSIS — N898 Other specified noninflammatory disorders of vagina: Secondary | ICD-10-CM

## 2024-03-28 DIAGNOSIS — Z23 Encounter for immunization: Secondary | ICD-10-CM

## 2024-03-28 DIAGNOSIS — B9689 Other specified bacterial agents as the cause of diseases classified elsewhere: Secondary | ICD-10-CM

## 2024-03-28 DIAGNOSIS — Z3A28 28 weeks gestation of pregnancy: Secondary | ICD-10-CM

## 2024-03-28 DIAGNOSIS — Z113 Encounter for screening for infections with a predominantly sexual mode of transmission: Secondary | ICD-10-CM

## 2024-03-28 DIAGNOSIS — Z131 Encounter for screening for diabetes mellitus: Secondary | ICD-10-CM

## 2024-03-28 DIAGNOSIS — Z369 Encounter for antenatal screening, unspecified: Secondary | ICD-10-CM

## 2024-03-28 DIAGNOSIS — Z3480 Encounter for supervision of other normal pregnancy, unspecified trimester: Secondary | ICD-10-CM

## 2024-03-28 DIAGNOSIS — O2393 Unspecified genitourinary tract infection in pregnancy, third trimester: Secondary | ICD-10-CM | POA: Diagnosis not present

## 2024-03-28 DIAGNOSIS — B3731 Acute candidiasis of vulva and vagina: Secondary | ICD-10-CM

## 2024-03-28 DIAGNOSIS — O26893 Other specified pregnancy related conditions, third trimester: Secondary | ICD-10-CM | POA: Diagnosis present

## 2024-03-28 DIAGNOSIS — Z114 Encounter for screening for human immunodeficiency virus [HIV]: Secondary | ICD-10-CM

## 2024-03-28 NOTE — Progress Notes (Addendum)
" ° ° °  Return Prenatal Note   Subjective   29 y.o. H4E6986 at [redacted]w[redacted]d presents for this follow-up prenatal visit. Labs today. Patient has had recurrent yeast and BV, and is still having symptoms. Aptima collected today. EPDS: 1 Patient reports: Movement: Present Contractions: Irritability  Objective   Flow sheet Vitals: Pulse Rate: 91 BP: 114/75 Fundal Height: 30 cm Fetal Heart Rate (bpm): 152 Total weight gain: 16.8 kg  General Appearance  No acute distress, well appearing, and well nourished Pulmonary   Normal work of breathing Neurologic   Alert and oriented to person, place, and time Psychiatric   Mood and affect within normal limits   Assessment/Plan   Plan  29 y.o. H4E6986 at [redacted]w[redacted]d presents for follow-up OB visit. Reviewed prenatal record including previous visit note.  Supervision of other normal pregnancy, antepartum 28 wk labs today.  Reviewed kick counts and preterm labor warning signs. Instructed to call office or come to hospital with persistent headache, vision changes, regular contractions, leaking of fluid, decreased fetal movement or vaginal bleeding.   Bacterial vaginosis Plan to reswab today, will reach out with results and treatment.   Recurrent candidiasis of vagina Reswab today, if positive consider alternative treatment.       Orders Placed This Encounter  Procedures   Tdap vaccine greater than or equal to 7yo IM   No follow-ups on file.   Future Appointments  Date Time Provider Department Center  04/09/2024  9:35 AM Jayne Harlene CROME, CNM [facility] None  04/23/2024  9:35 AM Dominic, Jinnie Jansky, CNM [facility] None    For next visit:  continue with routine prenatal care     Rosina Hamilton, Student-MidWife  03/28/24 11:28 AM  "

## 2024-03-28 NOTE — Assessment & Plan Note (Signed)
 Plan to reswab today, will reach out with results and treatment.

## 2024-03-28 NOTE — Progress Notes (Deleted)
" ° ° °  Return Prenatal Note   Subjective   29 y.o. H4E6986 at [redacted]w[redacted]d presents for this follow-up prenatal visit. Labs today. Patient *** Patient reports:    Objective   Flow sheet Vitals:   Total weight gain: 34 lb 8 oz (15.6 kg)  General Appearance  No acute distress, well appearing, and well nourished Pulmonary   Normal work of breathing Neurologic   Alert and oriented to person, place, and time Psychiatric   Mood and affect within normal limits   Assessment/Plan   Plan  29 y.o. H4E6986 at [redacted]w[redacted]d presents for follow-up OB visit. Reviewed prenatal record including previous visit note.  Supervision of other normal pregnancy, antepartum 28 wk labs today.  Reviewed kick counts and preterm labor warning signs. Instructed to call office or come to hospital with persistent headache, vision changes, regular contractions, leaking of fluid, decreased fetal movement or vaginal bleeding.       No orders of the defined types were placed in this encounter.  No follow-ups on file.   Future Appointments  Date Time Provider Department Center  03/28/2024 10:00 AM [facility] None  03/28/2024 11:15 AM Charma Domino, CNM [facility] None  04/09/2024  9:35 AM Jayne Harlene CROME, CNM [facility] None  04/23/2024  9:35 AM Dominic, Jinnie Jansky, CNM [facility] None    For next visit:  {[id number]}     Domino Charma, CNM  03/28/24 6:53 AM  "

## 2024-03-28 NOTE — Assessment & Plan Note (Signed)
 28 wk labs today.  Reviewed kick counts and preterm labor warning signs. Instructed to call office or come to hospital with persistent headache, vision changes, regular contractions, leaking of fluid, decreased fetal movement or vaginal bleeding.

## 2024-03-28 NOTE — Assessment & Plan Note (Signed)
 Reswab today, if positive consider alternative treatment.

## 2024-03-29 LAB — 28 WEEK RH+PANEL
Basophils Absolute: 0 10*3/uL (ref 0.0–0.2)
Basos: 0 %
EOS (ABSOLUTE): 0.1 10*3/uL (ref 0.0–0.4)
Eos: 1 %
Gestational Diabetes Screen: 75 mg/dL (ref 70–139)
HIV Screen 4th Generation wRfx: NONREACTIVE
Hematocrit: 37.3 % (ref 34.0–46.6)
Hemoglobin: 12.5 g/dL (ref 11.1–15.9)
Immature Grans (Abs): 0 10*3/uL (ref 0.0–0.1)
Immature Granulocytes: 0 %
Lymphocytes Absolute: 1.2 10*3/uL (ref 0.7–3.1)
Lymphs: 15 %
MCH: 28.8 pg (ref 26.6–33.0)
MCHC: 33.5 g/dL (ref 31.5–35.7)
MCV: 86 fL (ref 79–97)
Monocytes Absolute: 0.7 10*3/uL (ref 0.1–0.9)
Monocytes: 9 %
Neutrophils Absolute: 6.2 10*3/uL (ref 1.4–7.0)
Neutrophils: 75 %
Platelets: 206 10*3/uL (ref 150–450)
RBC: 4.34 x10E6/uL (ref 3.77–5.28)
RDW: 13.7 % (ref 11.7–15.4)
RPR Ser Ql: NONREACTIVE
WBC: 8.2 10*3/uL (ref 3.4–10.8)

## 2024-03-29 LAB — CERVICOVAGINAL ANCILLARY ONLY
Bacterial Vaginitis (gardnerella): POSITIVE — AB
Candida Glabrata: NEGATIVE
Candida Vaginitis: POSITIVE — AB
Comment: NEGATIVE
Comment: NEGATIVE
Comment: NEGATIVE

## 2024-04-01 ENCOUNTER — Ambulatory Visit: Payer: Self-pay | Admitting: Certified Nurse Midwife

## 2024-04-01 ENCOUNTER — Other Ambulatory Visit: Payer: Self-pay | Admitting: Certified Nurse Midwife

## 2024-04-01 MED ORDER — METRONIDAZOLE 0.75 % VA GEL: 1.0000 | Freq: Every day | VAGINAL | 5 refills | Status: AC

## 2024-04-01 MED ORDER — NYSTATIN-TRIAMCINOLONE 100000-0.1 UNIT/GM-% EX OINT: 1.0000 | TOPICAL_OINTMENT | Freq: Two times a day (BID) | CUTANEOUS | 0 refills | Status: AC

## 2024-04-09 ENCOUNTER — Encounter: Admitting: Certified Nurse Midwife

## 2024-04-09 DIAGNOSIS — Z3480 Encounter for supervision of other normal pregnancy, unspecified trimester: Secondary | ICD-10-CM

## 2024-04-09 DIAGNOSIS — Z3A29 29 weeks gestation of pregnancy: Secondary | ICD-10-CM

## 2024-04-23 ENCOUNTER — Encounter: Admitting: Licensed Practical Nurse
